# Patient Record
Sex: Female | Born: 1935 | Race: White | Hispanic: No | Smoking: Former smoker
Health system: Southern US, Community
[De-identification: ages and names within clinical notes are randomized; demographics above are authoritative.]

## PROBLEM LIST (undated history)

## (undated) DIAGNOSIS — Z794 Long term (current) use of insulin: Secondary | ICD-10-CM

## (undated) DIAGNOSIS — I495 Sick sinus syndrome: Secondary | ICD-10-CM

## (undated) DIAGNOSIS — I639 Cerebral infarction, unspecified: Secondary | ICD-10-CM

## (undated) DIAGNOSIS — I5042 Chronic combined systolic (congestive) and diastolic (congestive) heart failure: Secondary | ICD-10-CM

## (undated) DIAGNOSIS — I10 Essential (primary) hypertension: Secondary | ICD-10-CM

## (undated) DIAGNOSIS — Z950 Presence of cardiac pacemaker: Secondary | ICD-10-CM

## (undated) DIAGNOSIS — R55 Syncope and collapse: Secondary | ICD-10-CM

## (undated) DIAGNOSIS — IMO0001 Reserved for inherently not codable concepts without codable children: Secondary | ICD-10-CM

## (undated) DIAGNOSIS — I350 Nonrheumatic aortic (valve) stenosis: Secondary | ICD-10-CM

## (undated) DIAGNOSIS — Z862 Personal history of diseases of the blood and blood-forming organs and certain disorders involving the immune mechanism: Secondary | ICD-10-CM

## (undated) DIAGNOSIS — I2510 Atherosclerotic heart disease of native coronary artery without angina pectoris: Secondary | ICD-10-CM

## (undated) DIAGNOSIS — I359 Nonrheumatic aortic valve disorder, unspecified: Secondary | ICD-10-CM

## (undated) DIAGNOSIS — H5789 Other specified disorders of eye and adnexa: Secondary | ICD-10-CM

## (undated) DIAGNOSIS — I480 Paroxysmal atrial fibrillation: Secondary | ICD-10-CM

## (undated) DIAGNOSIS — N390 Urinary tract infection, site not specified: Secondary | ICD-10-CM

## (undated) DIAGNOSIS — N184 Chronic kidney disease, stage 4 (severe): Secondary | ICD-10-CM

## (undated) DIAGNOSIS — D649 Anemia, unspecified: Secondary | ICD-10-CM

## (undated) DIAGNOSIS — T829XXA Unspecified complication of cardiac and vascular prosthetic device, implant and graft, initial encounter: Secondary | ICD-10-CM

## (undated) DIAGNOSIS — I059 Rheumatic mitral valve disease, unspecified: Secondary | ICD-10-CM

## (undated) DIAGNOSIS — E119 Type 2 diabetes mellitus without complications: Secondary | ICD-10-CM

## (undated) HISTORY — PX: ABDOMINAL HYSTERECTOMY: SHX81

## (undated) HISTORY — PX: CHOLECYSTECTOMY: SHX55

## (undated) HISTORY — DX: Cerebral infarction, unspecified: I63.9

## (undated) HISTORY — PX: FRACTURE SURGERY: SHX138

## (undated) HISTORY — PX: EYE SURGERY: SHX253

---

## 1990-06-05 HISTORY — PX: FRACTURE SURGERY: SHX138

## 2001-06-05 HISTORY — PX: EYE SURGERY: SHX253

## 2003-06-06 HISTORY — PX: CARDIAC CATHETERIZATION: SHX172

## 2003-10-21 ENCOUNTER — Ambulatory Visit (HOSPITAL_COMMUNITY): Admission: RE | Admit: 2003-10-21 | Discharge: 2003-10-21 | Payer: Self-pay | Admitting: Cardiology

## 2005-02-14 ENCOUNTER — Encounter: Admission: RE | Admit: 2005-02-14 | Discharge: 2005-05-15 | Payer: Self-pay | Admitting: Nephrology

## 2005-09-30 ENCOUNTER — Encounter: Admission: RE | Admit: 2005-09-30 | Discharge: 2005-09-30 | Payer: Self-pay | Admitting: Geriatric Medicine

## 2006-04-05 DIAGNOSIS — Z950 Presence of cardiac pacemaker: Secondary | ICD-10-CM

## 2006-04-05 HISTORY — PX: PACEMAKER INSERTION: SHX728

## 2006-04-05 HISTORY — DX: Presence of cardiac pacemaker: Z95.0

## 2006-04-10 ENCOUNTER — Inpatient Hospital Stay (HOSPITAL_COMMUNITY): Admission: AD | Admit: 2006-04-10 | Discharge: 2006-04-13 | Payer: Self-pay | Admitting: Cardiology

## 2006-04-11 ENCOUNTER — Encounter (INDEPENDENT_AMBULATORY_CARE_PROVIDER_SITE_OTHER): Payer: Self-pay | Admitting: Cardiology

## 2006-06-10 ENCOUNTER — Inpatient Hospital Stay (HOSPITAL_COMMUNITY): Admission: EM | Admit: 2006-06-10 | Discharge: 2006-06-19 | Payer: Self-pay | Admitting: Emergency Medicine

## 2006-06-12 ENCOUNTER — Ambulatory Visit: Payer: Self-pay | Admitting: Infectious Diseases

## 2006-06-14 ENCOUNTER — Encounter: Payer: Self-pay | Admitting: Urology

## 2006-06-18 ENCOUNTER — Encounter (INDEPENDENT_AMBULATORY_CARE_PROVIDER_SITE_OTHER): Payer: Self-pay | Admitting: Specialist

## 2006-10-18 ENCOUNTER — Ambulatory Visit (HOSPITAL_COMMUNITY): Admission: RE | Admit: 2006-10-18 | Discharge: 2006-10-19 | Payer: Self-pay | Admitting: *Deleted

## 2007-01-02 ENCOUNTER — Ambulatory Visit (HOSPITAL_COMMUNITY): Admission: RE | Admit: 2007-01-02 | Discharge: 2007-01-02 | Payer: Self-pay | Admitting: Nephrology

## 2007-03-29 ENCOUNTER — Emergency Department (HOSPITAL_COMMUNITY): Admission: EM | Admit: 2007-03-29 | Discharge: 2007-03-29 | Payer: Self-pay | Admitting: *Deleted

## 2007-11-12 ENCOUNTER — Encounter: Admission: RE | Admit: 2007-11-12 | Discharge: 2007-11-12 | Payer: Self-pay | Admitting: Geriatric Medicine

## 2007-11-22 ENCOUNTER — Encounter: Admission: RE | Admit: 2007-11-22 | Discharge: 2007-11-22 | Payer: Self-pay | Admitting: Geriatric Medicine

## 2008-01-20 ENCOUNTER — Ambulatory Visit (HOSPITAL_COMMUNITY): Admission: RE | Admit: 2008-01-20 | Discharge: 2008-01-20 | Payer: Self-pay | Admitting: Surgery

## 2008-01-20 ENCOUNTER — Encounter: Admission: RE | Admit: 2008-01-20 | Discharge: 2008-01-20 | Payer: Self-pay | Admitting: Surgery

## 2008-01-20 ENCOUNTER — Encounter (INDEPENDENT_AMBULATORY_CARE_PROVIDER_SITE_OTHER): Payer: Self-pay | Admitting: Surgery

## 2008-06-18 ENCOUNTER — Encounter (INDEPENDENT_AMBULATORY_CARE_PROVIDER_SITE_OTHER): Payer: Self-pay | Admitting: Obstetrics and Gynecology

## 2008-06-18 ENCOUNTER — Inpatient Hospital Stay (HOSPITAL_COMMUNITY): Admission: RE | Admit: 2008-06-18 | Discharge: 2008-06-19 | Payer: Self-pay | Admitting: Obstetrics and Gynecology

## 2009-09-07 ENCOUNTER — Encounter (HOSPITAL_COMMUNITY)
Admission: RE | Admit: 2009-09-07 | Discharge: 2009-11-10 | Payer: Self-pay | Source: Home / Self Care | Admitting: Nephrology

## 2010-06-16 ENCOUNTER — Ambulatory Visit (HOSPITAL_COMMUNITY)
Admission: RE | Admit: 2010-06-16 | Discharge: 2010-06-16 | Payer: Self-pay | Source: Home / Self Care | Attending: Nephrology | Admitting: Nephrology

## 2010-09-19 LAB — CBC
HCT: 36.4 % (ref 36.0–46.0)
HCT: 40.4 % (ref 36.0–46.0)
Hemoglobin: 11.9 g/dL — ABNORMAL LOW (ref 12.0–15.0)
MCHC: 32.8 g/dL (ref 30.0–36.0)
MCHC: 32.8 g/dL (ref 30.0–36.0)
Platelets: 173 10*3/uL (ref 150–400)
Platelets: 201 10*3/uL (ref 150–400)
RBC: 4.47 MIL/uL (ref 3.87–5.11)
RDW: 14.6 % (ref 11.5–15.5)
RDW: 14.8 % (ref 11.5–15.5)
WBC: 8.4 10*3/uL (ref 4.0–10.5)

## 2010-09-19 LAB — COMPREHENSIVE METABOLIC PANEL
AST: 27 U/L (ref 0–37)
Albumin: 3.4 g/dL — ABNORMAL LOW (ref 3.5–5.2)
Albumin: 3.8 g/dL (ref 3.5–5.2)
BUN: 60 mg/dL — ABNORMAL HIGH (ref 6–23)
CO2: 27 mEq/L (ref 19–32)
Calcium: 9.4 mg/dL (ref 8.4–10.5)
Chloride: 96 mEq/L (ref 96–112)
Creatinine, Ser: 2.65 mg/dL — ABNORMAL HIGH (ref 0.4–1.2)
GFR calc non Af Amer: 17 mL/min — ABNORMAL LOW (ref >60)
Glucose, Bld: 143 mg/dL — ABNORMAL HIGH (ref 70–99)
Glucose, Bld: 148 mg/dL — ABNORMAL HIGH (ref 70–99)
Potassium: 3.7 mEq/L (ref 3.5–5.1)
Sodium: 135 mEq/L (ref 135–145)
Sodium: 138 mEq/L (ref 135–145)
Total Bilirubin: 0.5 mg/dL (ref 0.3–1.2)
Total Protein: 5.9 g/dL — ABNORMAL LOW (ref 6.0–8.3)

## 2010-09-19 LAB — TYPE AND SCREEN

## 2010-09-19 LAB — GLUCOSE, CAPILLARY
Glucose-Capillary: 124 mg/dL — ABNORMAL HIGH (ref 70–99)
Glucose-Capillary: 140 mg/dL — ABNORMAL HIGH (ref 70–99)

## 2010-09-19 LAB — ABO/RH: ABO/RH(D): O POS

## 2010-10-18 NOTE — Op Note (Signed)
[name], [name]               ACCOUNT NO.:  [id number]   MEDICAL RECORD NO.:  [id number]          PATIENT TYPE:  AMB   LOCATION:  SDS                          FACILITY:  [facility]   PHYSICIAN:  Thomas A. Cornett, M.D.DATE OF BIRTH:  15-Jun-1924   DATE OF PROCEDURE:  01/20/2008  DATE OF DISCHARGE:  01/20/2008                               OPERATIVE REPORT   PREOPERATIVE DIAGNOSIS:  Right breast mass x2.   POSTOPERATIVE DIAGNOSIS:  Right breast mass x2.   PROCEDURE:  Right breast needle-localized lumpectomy using 2 localizing  wires.   SURGEON:  Maisie Fus A. Cornett, MD   ASSISTANT:  OR staff.   ANESTHESIA:  General, LMA with 0.25% Sensorcaine local with epinephrine.   ESTIMATED BLOOD LOSS:  50 mL.   SPECIMEN:  Right breast tissue with both localizing wires, found to be  adequate specimen by radiograph.   DRAINS:  None.   INDICATIONS FOR PROCEDURE:  The patient is an 77 year old female who has  a right breast mass.  There was a second adjacent breast mass.  Biopsy  was discordant with the mammographic appearance, and excisional biopsy  was recommended to verify this was benign.  After discussion of this  with the patient, she understood and agreed to proceed.   DESCRIPTION OF PROCEDURE:  The patient was brought to the operating room  and placed supine.  After induction of LMA anesthesia, the right breast  was prepped and draped in sterile fashion.  She underwent needle  localization prior to coming to the operating room at the breast center  of [place], 2 wires being placed.  Sensorcaine 0.25% with epinephrine  was used for anesthesia.  Curvilinear incision was made in the right  lateral breast where the wires exited.  All tissue around both wires was  excised.  Radiograph was taken, which showed the wires to be intact,  clip be present within the specimen.  There was some bleeding from the  medial portion of the lumpectomy cavity and oversewed this with a 3-0  Vicryl.   Irrigation was used and suctioned out.  I used limited cautery  due to the fact she had a pacemaker.  Hemostasis was excellent.  After  irrigation, we closed the wound with  3-0 Vicryl.  Monocryl 4-0 was used to close the skin.  Dermabond was  applied as final dressing.  All final counts of sponge, needle, and  instruments were found to be correct at this portion of the case.  The  patient was awoke and taken to recovery in satisfactory condition.      Thomas A. Cornett, M.D.  Electronically Signed     TAC/MEDQ  D:  01/20/2008  T:  01/21/2008  Job:  [id number]   cc:   Hal T. Stoneking, M.D.

## 2010-10-18 NOTE — Op Note (Signed)
[name], [name]               ACCOUNT NO.:  [id number]   MEDICAL RECORD NO.:  [id number]          PATIENT TYPE:  OIB   LOCATION:  9372                          [facility]ACILITY:  [facility]   PHYSICIAN:  Charles A. Delcambre, MDDATE OF BIRTH:  25-Mar-1925   DATE OF PROCEDURE:  DATE OF DISCHARGE:                               OPERATIVE REPORT   PREOPERATIVE DIAGNOSES:  1. Uterine prolapse.  2. Cystocele.   POSTOPERATIVE DIAGNOSES:  1. Uterine prolapse.  2. Cystocele.   PROCEDURES:  1. Transvaginal hysterectomy.  2. Anterior repair.   SURGEON:  Charles A. Sydnee Cabal, MD   ASSISTANT:  Gerald Leitz, MD   ANESTHESIA:  General by laryngeal route.   FINDINGS:  Very large cystocele, no rectocele, moderate uterine  prolapse, normal-appearing ovaries bilaterally, but very high.  Therefore, not taken in the expediency of the operation to minimize her  care under anesthesia.  Estimated blood loss less than 50 mL, uterus to  pathology.  Instrument, sponge, and needle count correct x2.   DESCRIPTION OF PROCEDURE:  The patient was taken to the operating room,  placed in supine position.  General anesthetic was induced, then she was  placed in dorsal lithotomy position in universal stirrups.  Sterile prep  and drape was undertaken.  Weighted speculum was placed in the vagina.  The cervix was grasped with Lahey clamps, 1% epinephrine with lidocaine  was injected circumferentially and submucosal tissue around the cervix.  Knife was used to make the scoring incision around the cervix.  Bladder  pillars were cut with Metzenbaum scissors.  Finger dissection was used  to start the development of the bladder off the anterior lower uterine  segment.  Moist Ray-Tec was packed into the space further dissecting  this area.  The peritoneum was noted to slide on the anterior uterus and  this area was opened with Metzenbaum scissors and retractor was placed  in through the opening.  Deaver retractor, posterior  colporrhaphy was  then done and long speculum was placed in the posterior cul-de-sac.  Uterosacral ligaments were transected bilaterally, transfixion stitch  with 0 Vicryl.  Hemostasis was excellent.  Next, pedicle took the  uterine vessels bilaterally.  They were simple stitched and good  hemostasis resulted.  Next pedicle took the upper portion of the  cardinal ligaments and the broad ligament.  Fourth pedicle was free tied  and then transfixion stitch was 0 Vicryl.  Good hemostasis resulting and  with the utero-ovarian pedicles bilaterally.  Uterus was removed, go to  pathology.  Pedicles were visualized and noted to be of excellent  hemostasis and cuff was closed in 2 areas towards the midline to keep  the bladder up in the vault while the cystocele was being repaired.  Allis clamps were then placed in midline at the middle of the anterior  cuff and the submucosal dissection of the vagina off the  bladder/cystocele was undertaken.  This was done without difficulty.  Bladder flaps were developed with the knife and blunt dissection.  The  endopelvic fascia was plicated with 2-0 Vicryl, thus effectively  reducing the cystocele.  Excessive vaginal mucosa was removed, running  locking 2-0 Vicryl was then used to close the vaginal mucosa for the  anterior repair.  Richardson angle sutures were placed with 0 Vicryl and  the cuff was then running locked with 0 Vicryl with good hemostasis  resulting 2-inch gauze was moistened with KY gel and placed in the  vagina as a vaginal pack.  The patient was awakened and taken to  recovery with physician in attendance having tolerated the procedure  well.      Charles A. Sydnee Cabal, MD  Electronically Signed     CAD/MEDQ  D:  06/18/2008  T:  06/19/2008  Job:  [id number]

## 2010-10-18 NOTE — H&P (Signed)
NAMESTEPHANEY, [name] NO.:  [id number]   MEDICAL RECORD NO.:  [id number]         PATIENT TYPE:  WAMB   LOCATION:                                [facility]  [facility]   PHYSICIAN:  Charles A. Delcambre, MDDATE OF BIRTH:  1925/04/23   DATE OF ADMISSION:  06/18/2008  DATE OF DISCHARGE:                              HISTORY & PHYSICAL   HISTORY OF PRESENT ILLNESS:  A 77 year old gravida 3, para 3-0-0-3,  widowed, who initially referred from Dr. Pete Glatter for a mass at her  introitus.  We did on exam at that time note uterine prolapse as well as  a large cystocele.  No significant rectocele.  She has some urgency,  does not want  anything for this at this point.  Of note, she has gotten  worse pain after taking the pessary out and discomfort and pressure in  the pelvis and now presents wishing adamantly to go on with definitive  therapy, transvaginal hysterectomy, anterior repair, possible posterior  repair, and if possible technically bilateral oophorectomy.   PAST MEDICAL HISTORY:  Diabetes, cardiac arrhythmia reportedly atrial  fibrillation without clot, no antihypertension, no Coumadin used.  Per  her daughter who is a Publishing rights manager for Dr. Elsie Lincoln, her  cardiologist, and she also has hypertension.  She has mild renal  insufficiency.  Creatinine is less than 2.5.  Congestive heart failure  with a reaction to some type of medication, put on Lasix and no further  problems in this regard.  Mitral stenosis.   SURGICAL HISTORY:  Cholecystectomy, appendectomy, posterior repair in  1980 or 1981, rotator cuff left humerus fracture repair, left ankle  fracture repair x2.   MEDICATIONS:  1. Sular.  2. Lasix 40 once a day align.  3. Vicodin half to one tablet p.r.n.  4. Micardis 80 mg once a day.  5. Potassium, dose not specified.  6. Folic acid.  7. Iron.  8. Calcitriol.  9. Flecainide, Dr. Elsie Lincoln.  10.NovoLog 10 mg with meals.  This will be regular insulin and  Lantus      once a day insulin 26 units in the morning.  She does not know      hemoglobin A1c.   ALLERGIES:  DARVOCET, SULFA, PENICILLIN, CODEINE, all reactions not  specified.   SOCIAL HISTORY:  Denies tobacco, ethanol, or drug use.  Widowed, not  sexually active.   FAMILY HISTORY:  No major illness is contributing.   REVIEW OF SYSTEMS:  Denies fever, chills, rashes, lesions, headaches,  dizziness, chest pain, shortness of breath, wheezing, orthopnea,  diarrhea, constipation, bleeding per rectum, melena, hematochezia,  urgency, or frequency.  She does have some urgency, no frequency, or  dysuria or incontinence or hematuria.  No galactorrhea.  No emotional  changes.   PHYSICAL EXAMINATION:  GENERAL:  Alert and oriented x3.  VITAL SIGNS:  Blood pressure 122/80, respirations 24, and heart rate 80.  LUNGS:  Some rales are present in the base bilaterally left greater than  right.  HEART:  Regular rate and rhythm.  Grade 4 systolic murmur is noted and  listening at  the lower left sternal border.  At that point the daughter,  Vernona Rieger stated she had mitral stenosis.  ABDOMEN:  Overtly obese.  PELVIC:  Large cystocele, now extends outside of the vaginal vault and  with the Valsalva.  Uterus prolapses about 2 cm with Valsalva from the  introitus.  No rectocele was noted.  Uterus is not enlarged.  EXTREMITIES:  Some edema of the calf and symmetrical.   ASSESSMENT:  1. Large cystocele, uterine prolapse, symptomatic failing pessary      trial.  2. Hypertension.  3. Diabetes.  4. Cardiac arrhythmia, stable on meds.  5. Mitral stenosis.  6. Pacemaker in.   PLAN:  Transvaginal hysterectomy, bilateral oophorectomy if technically  feasible through the vaginal route, anterior repair, posterior repair as  indicated.  She accepts risks of infection, bleeding, bowel and bladder  damage, blood product risk including hepatitis and HIV exposure,  ureteral damage, postoperative vault prolapse,  continuous leakage  through low-pressure urethra, unmasked from cystocele repair, and  increased urinary urgency temporarily.  We will get a CBC, CMET, and  type and screen as she reportedly has numerous antibodies, Cleocin 900  mg IV preoperatively.  SCDs to the knees on.  A preop EKG from Dr.  Elsie Lincoln if recent or do a preop chest x-ray for evaluation of heart size  and for possible pleural effusions or other abnormality, incentive  spirometry, and fasting glucose upon arrival.  We will get clearing from  cardiologist, Dr. Elsie Lincoln.  The patient understands she is not an optimal  surgical candidate with multiple medical issues, but with degree pain  and inability to tolerate worsening symptoms, failing pessary.  We will  have a bed reserved in AICU for telemetry.       Charles A. Sydnee Cabal, MD  Electronically Signed     CAD/MEDQ  D:  06/10/2008  T:  06/11/2008  Job:  [id number]

## 2010-10-18 NOTE — Op Note (Signed)
[name], MELENA NO.:  [id number]   MEDICAL RECORD NO.:  [id number]          PATIENT TYPE:  OIB   LOCATION:  2807                         FACILITY:  [facility]   PHYSICIAN:  Darlin Priestly, MD  DATE OF BIRTH:  05/25/1925   DATE OF PROCEDURE:  10/18/2006  DATE OF DISCHARGE:                               OPERATIVE REPORT   PROCEDURES:  1. Pacemaker pocket modification.  2. Atrial lead adjustment.   COMPLICATIONS:  None.   INDICATIONS:  Ms. Roa is a 77 year old female patient of Dr. Lavonne Chick, Dr. Pete Glatter,  mother of Talon Witting, with history of syncope,  dizziness, paroxysmal atrial fibrillation, sick sinus syndrome,  underwent a dual chamber pacer implant 04/12/2006.  She did fall several  weeks after her implant with movement of her generator and retraction of  the passively fixed atrial lead, though she was still able to sense on  the lead. Her atrial sensing remained low with 0.8 mV.  She has had  recurrent UTIs and she has now been adequately treated for multiple  recurrent infections.  She is now brought for readjustment of her atrial  lead.   DESCRIPTION OF PROCEDURE:  After informed consent, the patient was  brought to the cardiac cath lab where left chest was prepped and draped  in sterile fashion.  ECG monitoring established.  1% lidocaine was used  to anesthetize over previous generator implant.  Next, approximately 2.5  to 3 cm incision was carried out over the previously placed generator  and hemostasis obtained with electrocautery.  Blunt dissection was used  to carry this down to the pacer capsule.  The capsule was then opened  with a scalpel and generator was freed from the scalpel.  The retaining  sutures were then cut and removed and the generator was then delivered  from the pocket.  It was noted that the atrial leads were entwined in  the pocket with moderate amount of scar tissue overlying both the atrial  and ventricular  leads.  The leads were then freed from the pocket floor.  The atrial lead retaining sutures were then removed and the atrial lead  was then removed from the header.  P-waves measured proximal 1 mV.  We  then advanced a stylet into the atrial lead and attempted to advance  lead further into the atrium.  However, the tip was already adherent to  the atrial wall.  This was a passive fixation lead.  We did try torquing  the lead to see if we could easily free this up from the atrium.  However, it remained attached and we ultimately abandoned this approach  as she was sensing in the atrium over 98% of the time and we did  increase her atrial sensing from 1-3 mV.  After the lead had adequate  slack we did resuture the lead down to the pectoral fascia.  We did  suture the lead down to the pectoral fascia with two 2-0 silk sutures.  A single silk suture was then placed in the superior aspect of the  pocket floor.  The  box then copiously irrigated with 1% Kanamycin  solution.  The atrial lead was then connected back to the generator and  a head screw was tightened.  Again pacing was confirmed the generator  and leads were then delivered to the pocket and the header was then  secured to the silk suture.  Subcutaneous layer was then closed in  running 2-0 Vicryl.  The skin was then closed in 4-0 Vicryl.  Steri-  Strips were applied.  The patient then transferred to recovery room in  stable condition.   CONCLUSION:  1. Successful pocket revision with advancement of the atrial lead.  We      were able to improve atrial sensing from 1 mV to 3 mV.  Threshold      remained high at approximately 4 to 4.5 volts at 0.4 milliseconds.      Darlin Priestly, MD  Electronically Signed     RHM/MEDQ  D:  10/18/2006  T:  10/18/2006  Job:  [id number]   cc:   Hal T. Stoneking, M.D.  Madaline Savage, M.D.

## 2010-10-18 NOTE — H&P (Signed)
[name], [name] NO.:  [id number]   MEDICAL RECORD NO.:  [id number]          PATIENT TYPE:  OIB   LOCATION:  2854                         FACILITY:  [facility]   PHYSICIAN:  Darlin Priestly, MD  DATE OF BIRTH:  [birth year]   DATE OF ADMISSION:  10/18/2006  DATE OF DISCHARGE:                              HISTORY & PHYSICAL   REASON FOR ADMISSION:  Atrial lead adjustment secondary to movement of  pacer wire.   HISTORY OF PRESENT ILLNESS:  A 77 year old white widowed female with  history of some complete heart block episodically with associated  dizziness and placement of a permanent Medtronic pacemaker DDD in  November 2007.  The patient has been atrially sensing and ventricularly  capturing since that time.  She was hospitalized in January 2008 for  urinary tract infection.  At that time she had fallen across the bed and  we believe the lead was dislodged when she was moving to get up out of  the bed.  It was shortly thereafter that on interrogation we found that  the atrial lead was not pacing and chest x-ray confirmed that the wire  itself had been pulled back.   PAST MEDICAL HISTORY:  1. Cardiac as stated.  She does have a history of two-vessel coronary      artery disease with last heart catheterization Oct 21, 2003, 60%      stenosis of diagonal #1, 30% stenosis PDA, and 60-70% proximal to      mid PLA.  Persantine Cardiolite was done in April 2007.  EF was      62%, no ischemia.  Last 2-D echocardiogram was September 06, 2005:      Moderate to marked MAC, EF was normal, mild to moderate mitral      stenosis, mild MR, mild TR, mild AV stenosis.  AI was new.  2. She also has a history of kidney stones with lithotripsies in the      1990s.  3. She has had chronic UTIs on antibiotics.  Since December 2007 she      has had increasing pain in urination.  Prolonged hospitalization in      January 2008 secondary to UTI and then urinary residual, as well as  pain.  Followed by Dr. Sherron Monday, currently on Keflex to prevent      infections.  4. GYN:  Has a pessary.  5. Renal insufficiency; creatinine runs 2.2 to 2.4.  6. Insulin-dependent diabetes mellitus 2, controlled.  7. Hypertension.  8. Hyperlipidemia.  9. History of autoimmune hemolytic anemia, treated for 6 months with      steroids.  They felt it was secondary to possible Norvasc, but      definitely quinine.  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  She is monitored by Dr.      Darrick Penna for anemia with her renal insufficiency.  11.Status post cholecystectomy.  12.History of ankle fracture x2 with surgeries.  13.History of fracture to the left humerus with bone graft.  14.History of right rotator cuff repair.  15.History of COPD but it is aggravated by  beta blockers.   ALLERGIES:  SULFA, PENICILLIN, DARVOCET.  Intolerant to NORVASC, QUININE  and CODEINE.  Intolerance to BETA BLOCKERS, FLOXACILLIN, LEVAQUIN due to  severe nightmares, and ACE INHIBITORS have caused cough.   FAMILY HISTORY:  Not significant to this admission.   SOCIAL HISTORY:  Widowed with three children, six grandchildren, one  great-grandchild.  Does not drink alcohol, stopped tobacco 20 years ago.  She is a retired Engineer, civil (consulting).  No longer drinks caffeine secondary to her  bladder issues.   OUTPATIENT MEDICATIONS:  1. Flecainide 50 b.i.d.  2. Vytorin 10/40 daily.  3. Folic acid 1 mg twice a day.  4. Colace 200 mg twice a day.  5. Multivitamin daily.  6. Cozaar 100 mg daily.  7. Lantus insulin 40 units in the morning.  8. NovoLog insulin 10 units at breakfast, 12 units at lunch, and 12      units at supper, though this is variable depending on her glucose.  9. Calcitriol 0.5 mg daily.  10.Nu-Iron 150 mg twice a day.  11.Aspirin 81 mg daily.  12.Keflex 500 mg daily.  13.Lasix 20 mg every-other day.  14.K-Dur 10 mEq every-other day.   REVIEW OF SYSTEMS:  GENERAL:  Loss of appetite with declining weights,  5  pounds in the last month.  SKIN:  Without rashes.  HEENT:  She does wear  glasses.  No blurred vision.  CARDIOVASCULAR:  No chest pain.  PULMONARY:  Some dyspnea on exertion but minimal at this point.  GI:  Frequent constipation on Colace as well as fiber frequently and Activa  has helped.  GU:  Frequent urination every 30 minutes with urethral  pain.  NEUROLOGIC:  No lightheadedness, dizziness or syncope.  MUSCULOSKELETAL:  She does get hip pain and back pain.  ENDOCRINE:  She  is diabetic.  No thyroid disease.   PHYSICAL EXAMINATION:  VITAL SIGNS TODAY:  Blood pressure 128/66, pulse  84 and respirations 18, temperature 97.1, oxygen saturation 98%.  Height  5 feet, weight 75 kg.  Accu-Chek this morning 103.  GENERAL:  Alert and oriented x3.  Having toe cramps currently.  Skin  warm and dry.  Brisk capillary refill.  Pleasant affect.  HEENT:  Sclerae are clear, glasses in place.  NECK:  Supple.  No JVD, no adenopathy.  HEART:  S1, S2.  Regular rate and rhythm with a 2-3/6 systolic murmur.  ABDOMEN:  Soft, nontender, positive bowel sounds.  Cannot palpate liver,  spleen or masses.  LOWER EXTREMITIES:  Without edema.  Pedal pulses are present.  Please  note she has bruising on the left lower extremity and left upper arm  from fall on Saturday at her nephew's home.  NEUROLOGIC:  Alert and oriented x3.  Follows commands.   LABORATORIES:  Pending.   IMPRESSION:  1. Dislodgement of her atrial lead.  Plan is for better position of      the atrial lead today.  2. Complete heart block with a permanent transvenous pacemaker DDD      Medtronic.  3. History of coronary disease.  4. Diabetes mellitus.  5. Severe urinary discomfort with frequent urination, followed by Dr.      Sherron Monday.  6. Renal insufficiency with creatinine 2.2 to 2.4.  7. Some mild heart failure in February 2008 with elevated BNP, now      resolved. 8. Hypertension, controlled.  9. Diabetes mellitus type 2,  controlled.  10.History of anemia followed by Dr. Darrick Penna with history of Aranesp  injections, but none recently.   PLAN:  Plan for temporary pacemaker and adjustment of her atrial lead.      Darcella Gasman. Valarie Merino      Darlin Priestly, MD  Electronically Signed    LRI/MEDQ  D:  10/18/2006  T:  10/18/2006  Job:  [id number]   cc:   Martina Sinner, MD  Hal T. Stoneking, M.D.  Madaline Savage, M.D.  James L. Deterding, M.D.

## 2010-10-18 NOTE — Discharge Summary (Signed)
[name], [name] NO.:  [id number]   MEDICAL RECORD NO.:  [id number]          PATIENT TYPE:  OIB   LOCATION:  2008                         FACILITY:  [facility]   PHYSICIAN:  Darlin Priestly, MD  DATE OF BIRTH:  1925/02/09   DATE OF ADMISSION:  10/18/2006  DATE OF DISCHARGE:  10/19/2006                               DISCHARGE SUMMARY   DISCHARGE DIAGNOSES:  1. Status post pacer pocket modification and adjustment of the      arterial lead.  2. Status post placement of Medtronic pacemaker in November of 2007      ________ mode.  3. Known history of intermittent complete heart block associated with      dizziness.  4. History of 2-vessel coronary artery disease.  5. Aortic stenosis.  6. Nephrolithiasis.  7. Chronic urinary tract infection on continuous antibiotic therapy.  8. Renal insufficiency.  9. Hypertension.  10.Hyperlipidemia.  11.Insulin-dependent diabetes mellitus - controlled.  12.Autoimmune hemolytic anemia, status post steroid therapy.      Questionable secondary to Norvasc therapy and definitely secondary      to quinine.  13.History of ________.  14.Status post cholecystectomy.  15.Status post multiple ankle surgeries due to fracture and history of      fracture of the left humerus with bone graft.  16.Status post rotator cuff repair on the right.  17.Chronic obstructive pulmonary disease that is aggravated by beta-      blockers.   This is a 77 year old Caucasian female who underwent permanent pacemaker  placement in November of 2007.  During her hospitalization in January of  2008, for a urinary tract infection, patient fell and dislodged the  pacer wire.  After interrogation of the pacer, it was found that the  atrial lead was not pacing and chest x-ray confirmed that the wire,  itself, has been put back.  So, on Oct 18, 2006, she was admitted to the  hospital for a pacer pocket modification and atrial lead adjustment.  This procedure  was done by Dr. Jenne Campus and patient tolerated this well.  Later, the same day was seen by Dr. Elsie Lincoln.  The next morning, the  Medtronic representative interrogated the pacer, all parameters were  within normal limits.  Dr. Domingo Sep saw patient in the morning on May 16  and considered her stable for discharge home.   DISCHARGE MEDICATIONS:  1. Folic acid 1 mg daily.  2. Nu-Iron 150 mg b.i.d.  3. Calcitriol 0.5 mg daily.  4. NovoLog 10 units every morning, 12 units at lunch and in the      evening.  5. Lantus 40 units every more.  6. Keflex 500 mg daily.  7. Flecainide 50 mg b.i.d.  8. Potassium chloride 10 mEq every other day.  9. Lasix 20 mg every other day.  10.Cozaar 100 mg daily.  11.Vytorin 10/40 mg daily.  12.Aspirin 81 mg daily.   Patient was advised to stay on a low-fat, low-cholesterol diet.  She was  instructed not to perform overhead reaching, avoid vigorous movements  with right arm.  Do not wet incision site for  a week.   DISCHARGE FOLLOWUP:  Dr. Jenne Campus will see patient on November 29, 2006 at 2  p.m. and in a week after the procedure, the incision will be checked to  make sure that it is healed appropriately.      Raymon Mutton, P.A.      Darlin Priestly, MD  Electronically Signed    MK/MEDQ  D:  10/19/2006  T:  10/20/2006  Job:  [id number]

## 2010-10-21 NOTE — Discharge Summary (Signed)
NAMEZHOE, CATANIA NO.:  [id number]   MEDICAL RECORD NO.:  [id number]          PATIENT TYPE:  INP   LOCATION:  6522                         FACILITY:  [facility]   PHYSICIAN:  Madaline Savage, M.D.DATE OF BIRTH:  10/28/1924   DATE OF ADMISSION:  04/10/2006  DATE OF DISCHARGE:  04/13/2006                                 DISCHARGE SUMMARY   DISCHARGE DIAGNOSES:  1. Symptomatic bradycardia with second-degree, 2 to 1, AV block status      post permanent transvenous pacemaker placement by Dr. Jenne Campus.  2. Nonobstructive coronary artery disease with a negative stress test in      April 2007.  3. Renal insufficiency.  4. Known diabetes mellitus, type 2.  5. Hypertension, controlled.  6. History of paroxysmal atrial fibrillation, maintained sinus rhythm on      flecainide.  7. Frequent urinary tract infections.  8. Hyperlipidemia.   Dictation ended at this point.      Raymon Mutton, P.A.    ______________________________  Madaline Savage, M.D.    MK/MEDQ  D:  04/13/2006  T:  04/14/2006  Job:  [id number]

## 2010-10-21 NOTE — Op Note (Signed)
[name], [name]               ACCOUNT NO.:  [id number]   MEDICAL RECORD NO.:  [id number]          PATIENT TYPE:  AMB   LOCATION:  DAY                          FACILITY:  [facility]   PHYSICIAN:  Martina Sinner, MD DATE OF BIRTH:  Jul 01, 1924   DATE OF PROCEDURE:  06/14/2006  DATE OF DISCHARGE:                               OPERATIVE REPORT   PREOPERATIVE DIAGNOSIS:  Cystitis, possible bladder enteric fistula.   POSTOPERATIVE DIAGNOSIS:  Cystitis.   NAME OF SURGERY:  Cystoscopy, cystogram, bladder installation therapy.   Romelle Muldoon has symptomatic cystitis with severe frequency and that  has required admission to the hospital and she has not responded well to  ceftriaxone which is an appropriate antibiotic for her E-coli.  For this  reason I want to examine her under anesthesia.   The patient was prepped and draped in the usual fashion.  Initially  anesthesia had some trouble with her blood pressure.  She does have  cardiac comorbidities.  She was given, I believe, a neo epinephrine drip  and her blood pressure responded and I was asked to go ahead with  cystoscopy.   When I initially cystoscoped the patient, she had diffuse hyperemia in  keeping with a diagnosis of bacterial cystitis.  She had diffuse  cystitis cystica.  The trigone was normal.  There is no stitch, foreign  body or carcinoma.  Her bladder was spastic in that I could not easily  do a gravity cystogram.   Her urethra was erythematous and normal length.   I did a pelvic examination and her diaphragm pessary was in the high  vaginal vault in good position.  She has an obvious cystocele.  Tissues  were well estrogenized.  There is no inflammation or infection inside  the vagina.   I tried not to over distend her bladder since it caused some bleeding.  I did a lot of irrigation to make certain that the bladder was clear.  When I cystoscoped her at the end of the case she had erythema but no  obvious  bleeding.   I initially instilled a 15-French Foley catheter.  I attempted to  perform a gravity cystogram under fluoroscopy but no dye would enter the  bladder.   For this reason I took out the Foley catheter and scoped the patient  again.  I rinsed her bladder more.  There is no question she had at  least 50 mL bladder capacity with the cystoscope and I think much  greater than this.  I thought it was very safe therefore to inject 50 mL  of contrast through the cystoscope sheath under fluoroscopic guidance.   I did inject 50 to 60 mL of contrast.  She had a nice smooth bladder  with mild trabeculation.  There was no fistula.   The bladder was emptied and rinsed.  There was no residual dye in the  vagina or bowel.   I finally irrigated the bladder until it was clear.  I then instilled 15  mL of 0.5% Marcaine in combination with 400 mg of Pyridium.  A Foley  catheter was not inserted since she does not tolerate it well.   I am going to continue was symptomatic and supportive care.  She has had  two high residual urine volumes and I am going to see if she can  tolerate in and out cath twice a day.  I will follow her.           ______________________________  Martina Sinner, MD  Electronically Signed     SAM/MEDQ  D:  06/14/2006  T:  06/15/2006  Job:  [id number]

## 2010-10-21 NOTE — Discharge Summary (Signed)
NAMEMOLLY, [name] NO.:  [id number]   MEDICAL RECORD NO.:  [id number]          PATIENT TYPE:  INP   LOCATION:  6522                         FACILITY:  [facility]   PHYSICIAN:  Madaline Savage, M.D.DATE OF BIRTH:  11-30-1924   DATE OF ADMISSION:  DATE OF DISCHARGE:  04/13/2006                                 DISCHARGE SUMMARY   ADDENDUM:  This is a continuation to the previously started dictation.  Dictation [id number].   HPI/HOSPITAL COURSE:  Ms Parker is a 77 year old Caucasian lady, patient of  Dr. Elsie Lincoln, with a previous history of paroxysmal atrial fibrillation in  2000, since 2005 and Toprol intolerance secondary to CAPD and increased  dyspnea.  Was taking flecainide at home and maintained sinus rhythm.  She  has a known history of noncritical coronary artery disease by last  catheterization in May 2005.  The most recent Myoview stress test in April  2007, revealed EF of 67% and no ischemia.  The patient was in her usual  state of health up until a week ago when she started noticing recurrent  episodes of dizziness during which she would stop her activity and had to  hold onto some object if she was standing or walking.  That continued for  two to three minutes and later episodes started happening even while patient  was sitting.  Because of her prior history of bradycardia in the past,  flecainide dose was reduced to 50 mg b.i.d. and event monitor was ordered to  rule out any recurrent PAF or bradyarrhythmia.   On the day of admission, April 10, 2006, the event monitor revealed a 4  second pulse with a second-degree 2:1 heart block.  The patient also had an  episode of dizziness that occurred right at the time of the asystole.   Dr. Elsie Lincoln saw the patient in the office and recommended admission to the  hospital for permanent pacemaker placement and monitoring on telemetry.   During the hospitalization, the patient underwent an echocardiogram, which  was  done on April 11, 2006, and it showed DUST, below normal limit of  LVEF, approximately 50%, and moderate diastolic dysfunction.  Mild aortic  valve stenosis was also observed with estimated aortic valve area 0.99  square centimeters by __________ and by VTI it was 0.94 square centimeters.  Mean transaortic valve gradient was 11 mmHg.  There was marked annular  calcification and mildly reduced mitral valve leaflet excursion.   FINDINGS:  Consistent with very mild mitral stenosis.  Mean transmitral  gradient was 3 mmHg and mitral valve by pressure, half time was 1.86 square  centimeters.  Left atrial size was the upper limits of normal.   On April 12, 2006, the patient underwent permanent pacemaker implantation  by Dr. Jenne Campus and she received a Medtronic device.  The procedure was  carried out with no complications.  The next morning the [employer]  representative interrogated the device and all values were within normal  limits.  No changes made.  Next morning, she had a chest x-ray that did not  reveal any acute disease.  No  pneumothorax.  Dr. Elsie Lincoln and Dr. Jenne Campus,  both saw the patient and considered her to be stable for discharge home.   LABORATORIES:  Hemoglobin was 13.9, hematocrit 40, white blood cell count  5.9.  Platelets 210.  Sodium 140, potassium 4.2, chloride 106, CO2 27, BUN  46, creatinine 2.3, which was an improvement from 2.6 the previous day.  Glucose 110.  Hemoglobin A1c 5.8.   DISCHARGE INSTRUCTIONS:  The patient was instructed to avoid any vigorous  movements with the right arm and especially avoid overhead reaching and no  lifting with the right arm.  She also was instructed to keep the incision  dry and intact for seven days.   I had to order Kalia Vahey, who is the nurse practitioner in our office and  she will check the pacer site in a week at home and then patient will be  seen by Dr. Elsie Lincoln in the office on May 01, 2006 at 4:00 p.m., then a   follow up by Dr. Jenne Campus in three months, and that appointment will be  scheduled later.   DISCHARGE MEDICATIONS:  1. Flecainide 50 mg b.i.d.  2. Vytorin 10/80 mg daily.  3. Folic acid 1 mg b.i.d.  4. Potassium chloride 10 mEq b.i.d.  5. Colace 100 mg b.i.d.  6. Multivitamins daily.  7. Hyzaar 100/25 mg daily.  8. Cozaar 100 mg b.i.d.  9. Amitriptyline 25 mg q.h.s.  10.Trimethoprim 100 mg daily.  11.Lantus 50 units q.a.m.  12.NovoLog 10 units at breakfast, 12 units at lunch, 12 units at supper.  13.Caltrate with vitamin D 60 mg b.i.d.  14.Iron complex, Nu-Iron 150 mg b.i.d.  15.Temovate ophthalmic ointment apply b.i.d.  16.Aspirin 81 mg daily.      Raymon Mutton, P.A.    ______________________________  Madaline Savage, M.D.    MK/MEDQ  D:  04/13/2006  T:  04/14/2006  Job:  [id number]

## 2010-10-21 NOTE — Cardiovascular Report (Signed)
NAMESINCERE, BERLANGA NO.:  [id number]   MEDICAL RECORD NO.:  [id number]                   PATIENT TYPE:  OIB   LOCATION:  2852                                 FACILITY:  [facility]   PHYSICIAN:  Madaline Savage, M.D.             DATE OF BIRTH:  06-17-1924   DATE OF PROCEDURE:  10/21/2003  DATE OF DISCHARGE:  10/21/2003                              CARDIAC CATHETERIZATION   PROCEDURES PERFORMED:  1. Selective coronary angiography by Judkins technique.  2. Retrograde left heart catheterization without left ventricular     angiography.   ENTRY SITE:  Right femoral.   DYE USED:  Visipaque.   COMPLICATIONS:  None.   PATIENT PROFILE:  The patient is a 77 year old widowed white female from  [place], [place] who has known longstanding diabetes and also has  renal insufficiency.  The patient is obese and has experienced progressive  shortness of breath complicated by the fact that exercise is difficult  related to her orthopedic problems including hips and knees.  The patient  had a recent echocardiogram and Cardiolite stress test at the [facility] office in [place] showing good left ventricular  systolic function and suspicious mild inferior wall ischemia considered to  be low risk.  There was also some inferior hypokinesis.  Because of these  findings and her risk factors, the patient was scheduled for outpatient  cardiac catheterization to be planned as a dye limited study because of  creatinine of 2.1.  Previous creatinine is 1.9.  Among the patient's various  medical problems include hypertension, diabetes mellitus, hyperlipidemia,  chronic renal insufficiency, obesity, previous multiple orthopedic  surgeries, autoimmune hemolytic anemia now resolved and sedentary lifestyle.  Today's procedure was performed on an inpatient basis electively without  complications.  The dye load of Visipaque was 100 mL.   RESULTS:   PRESSURES:  Left ventricular pressure was 180/85, mean of 125.  Left  ventricular pressure was 177/8, end-diastolic pressure 13 without any  significant aortic valve gradient by pullback technique.   ANGIOGRAPHIC RESULTS:  The patient's cinefluoroscopy of the heart showed  anular calcification in the mitral valve as well as right coronary  calcification proximal and mid and also proximal LAD calcification as well.   1. The left main coronary artery was approximately 4.5 mm in diameter and     was smooth without any significant obstructive lesions.  2. The LAD coursed the cardiac apex and gave rise to a bifurcating diagonal     branch.  The LAD was small to medium in size with luminal irregularities     throughout, but no significant lesions.  A fairly large bifurcating     diagonal branch showed that after a bifurcation the medial limb closest     to the LAD of that diagonal contained a 60% area of irregular contour and     lumpy, bumpy irregularity.  The  distal vessel looked more normal in     appearance though tortuous.  The second limb of the diagonal appeared to     be large and without any obstructive lesion.  3. The intermediate ramus branch was a very large vessel coursing near the     cardiac apex showing no obstructive lesions.  The circumflex contained     lumpy, bumpy irregularity in the mid portion of the vessel just after the     first obtuse marginal branch arises, but no significant lesions.  4. The right coronary artery was calcified as was previously mentioned     proximally and in the mid portion of the vessel.  No obstructive lesions     were noted from the ostium of the RCA to the distal bifurcation point of     that vessel. The posterior descending branch was a medium to even large     size vessel with 30% areas of luminal irregularity proximally.  The     posterior lateral branch was a  long fairly small vessel 1.5 to 1.75 mm     in diameter and there was between a  60-70% stenosis in that vessel in its     proximal to mid portion.  The distal vessel was small and long.   The left ventricle was entered and pressures measured, but no left  ventricular angiogram was performed.   Right femoral arteriography with hand injection showed suitable anatomy for  Angio-Seal placement which was placed without any complications with an  excellent result.   FINAL DIAGNOSES:  1. Mild two-vessel coronary artery disease.     A. 60% medial bifurcating branch of diagonal branch #1.     B. 30% posterior descending branch stenosis of the right coronary artery.     C. 60-70% proximal to mid posterior lateral artery stenosis of the distal        right coronary artery.  2. No evidence of aortic valve gradient by pullback technique.  3. Successful Angio-Seal arteriotomy of the right femoral artery.   PLAN:  Medical therapy of mild coronary disease.  The patient's shortness of  breath would most likely be explained by body habitus, lack of exercise and  lack of activity and possibly related to her previous history of tobacco  use.                                               Madaline Savage, M.D.    WHG/MEDQ  D:  10/21/2003  T:  10/22/2003  Job:  [id number]   cc:   Oneida Alar  Fax #:  [phone]   Debra Stengel  [place], [place]

## 2010-10-21 NOTE — Discharge Summary (Signed)
NAMEJESSELLE, Parker NO.:  [id number]   MEDICAL RECORD NO.:  [id number]          PATIENT TYPE:  INP   LOCATION:  4715                         FACILITY:  [facility]   PHYSICIAN:  Madaline Savage, M.D.DATE OF BIRTH:  1924-11-07   DATE OF ADMISSION:  06/10/2006  DATE OF DISCHARGE:  06/19/2006                               DISCHARGE SUMMARY   [name] is a 77 year old white, widowed, female patient of Dr.  Elsie Lincoln, who is Shaterra Sanzone mother, who came in to the emergency room  because of severe pain from UTI.  She was actually admitted for  intractable pain and further treatment.  She failed antibiotics as an  outpatient.  She had been placed on Cipro initially, but then changed to  Macrobid after the culture came back.  She tried that for 1 week, then  resumed trimethoprim; however, she then had increased burning with  urination and was then placed on Keflex.  She continued to have burning  and Pyridium was added.  Over the last 2 days, prior to admission, she  had increased pain, increased frequency, she was basically up all night.  She was unable to eat and she started vomiting, thus she came to the  emergency room.  She was admitted by our service.  A GU consult was  called.  She was seen by Dr. Lorin Picket MacDiarmid who put her on IV  ceftriaxone.  He recommended a nephrology consult because he would like  to use Macrodantin.  She has a history of chronic renal insufficiency  and is followed usually by Dr. Darrick Penna as an outpatient.  Her symptoms  did not seem to improve.  The renal consult was called.  They did not  want Macrodantin used.  They also felt an infectious disease consult was  needed.  She was seen by Dr. Roxan Hockey who also recommended the use of  topical estrogens in chronic UTIs.  This had been tried as an outpatient  and she was not able to tolerate estrogen cream; however, it was  attempted again and she again had a burning sensation.  They  also tried  B&O suppositories; however, this caused the urethral burning to be  worse.  Levsin was tried for symptomatic relief, which helped her  anywhere from 20 minutes to an hour.  She continued to have burning and  had to sit up on the bedside commode all night.  She, at one point,  became dizzy and lightheaded from the narcotics and being over tired and  fell across her bed at one point in her hospitalization.  She went on to  have a cystoscopy by Dr. Sherron Monday on June 14, 2006, which showed  positive cystitis, diffuse hyperemia, no cancer, no fistula was noted.  He did instill some Pyridium and Marcaine for rescue treatments.  He  irrigated and flushed her bladder.  The day after this, she did feel  better; however, then her symptoms again got worse.  They started  scanning her bladder for residual.  She had high postvoid residuals.  It  was then decided that she  should have in and out cath.  Her residuals  with the in and out cath were high and it was thought she would have to  be discharged home on in and out catheters 3 times a day.  The patient  would not be able to do those on her own.  Her daughter agreed to  perform these.  GU had recommended that she be on Keflex for a total of  7-10 days p.o. upon discharge.   On June 19, 2006, she was seen again by nephrology who scheduled her  to have an outpatient appointment.  She will need to continue her  appropriate injections as an outpatient.  Also to note, her HCTZ had  been held when she was in the hospital.   LABS ON ADMISSION:  Hemoglobin was 12.4, hematocrit was 37.7, WBCs 4.9,  platelets 248.  On June 19, 2006, hemoglobin was 9.6, hematocrit was  28.8, WBCs were 8.2 and platelets were clumping.  Sodium is 135,  potassium 5.0, BUN was 73, creatinine up to 3.0, on admission it was  3.3.  Albumin was 3.8, magnesium was 2.6, AST was 31, ALT was 20, total  protein was 5.8.  BNP was 127, on January 13 it was 1023 and  on January  14 it was 822.  I should note, she did receive IV Lasix 40 mg on June 19, 2006.  Iron was 24.  TIBC was 158, percent saturation was 15.  Chest  x-ray showed no acute distress on June 10, 2006.  Renal ultrasound  showed an increased echogenicity of kidneys.  No hydronephrosis,  bilateral renal cysts.   DISCHARGE MEDICATIONS:  Are:  1. Flecainide 50 mg twice a day.  2. Vytorin 10/80 twice a day.  3. Folic acid 1 mg once a day.  4. New iron 150 mg twice a day.  5. Aspirin 81 mg once a day.  6. Lantus 30 units once a day.  7. Mucinex 600 mg twice a day x7 days.  8. Aranesp 200 mcg subcu every Monday.  9. Keflex 500 mg 4 times a day x5 days then once a day.  10.Xopenex MDI 2 puffs twice a day.  11.Colace 100 mg twice a day.  12.Multivitamin once a day.   DISCHARGE DIAGNOSES:  1. Severe urinary tract infection with intractable pain.  2. Chronic urinary tract infections.  3. Chronic renal insufficiency with some elevation during      hospitalization.  4. History of coronary artery disease with 2-vessel coronary disease,      60% in a diagonal, 30% in PDA, 50-70% proximal PLA.  Negative      Persantine Cardiolite September 07, 2005.  Ejection fraction of 62%.  5. PT VDP Medtronic on November of 2007 for asystole, I believe      symptomatic and near syncope.  6. Insulin dependent diabetes mellitus.  7. Hypertension.  8. Hyperlipidemia.  9. History of autoimmune hemolytic anemia.  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  11.Diabetic retinopathy and neuropathy.  12.Autoimmune skin disorder.  13.Status post cholecystectomy.  14.Chronic obstructive pulmonary disease.      [name], N.P.    ______________________________  Madaline Savage, M.D.    BB/MEDQ  D:  08/08/2006  T:  08/09/2006  Job:  [id number]   cc:   Hal T. Stoneking, M.D.  Martina Sinner, MD  Llana Aliment. Deterding, M.D.

## 2010-10-21 NOTE — Consult Note (Signed)
[name], [name]               ACCOUNT NO.:  [id number]   MEDICAL RECORD NO.:  [id number]          PATIENT TYPE:  INP   LOCATION:  4715                         FACILITY:  [facility]   PHYSICIAN:  Alvin C. Lowell Guitar, M.D.  DATE OF BIRTH:  1925-01-06   DATE OF CONSULTATION:  DATE OF DISCHARGE:                                 CONSULTATION   I was asked by Dr. Alfredo Martinez to see this 77 year old female who  has a known history of chronic kidney disease, stage IV, followed at our  office by Dr. Darrick Penna, admitted on January6, 2008, with symptomatic  urinary tract infection.  We are asked to see her, because of concerns  of withholding Macrodantin therapy.  The patient has not been treated  with Macrodantin because of a reduced GFR.  Baseline serum creatinine is  2.5 mg/dL as of [id number].  Current labs reveal a BUN of 57,  creatinine 3.39.  The patient had been taking Cozaar 50 mg per day and  Hyzaar 100/25 mg per day.  Both of those medications are on hold after  admission.  She reports a history of poor appetite times several months.  Urine culture during this hospitalization has grown a highly resistant E-  coli as well as a usual enterococcus.  The patient complains of  frequency and severe urgency.  Bladder scan done by Dr. Sherron Monday on  January3, 2008, revealed a 125 mL postvoid residual.  Cystoscopy by him  in March, revealed essentially no significant findings.   CURRENT MEDICATIONS:  1. Amitriptyline.  2. Aspirin.  3. Rocephin.  4. Flexor.  5. Colace.  6. Folic acid.  7. Insulin.  8. Hydrochlorothiazide 25 mg per day.  9. Potassium 10 mEq per day.  10.Pyridium.   PAST HISTORY:  Remarkable for:  1. Diabetes.  2. Anemia.  3. Hypertension.  4. Dyslipidemia.  5. Status post pacemaker placement.  6. History of atherosclerotic cardiovascular disease.  7. Coronary artery disease.  8. History of a urinary stone disease, status post lithotripsy years      ago.   SOCIAL HISTORY:  She is a retired Engineer, civil (consulting).  She lives with her daughter.   PHYSICAL EXAMINATION:  VITAL SIGNS:  Blood pressure is 125/60, afebrile.  GENERAL:  She is pleasant, moderately obese, Caucasian female.  HEENT:  Atraumatic.  Extraocular movements are intact.  LUNGS:  Clear.  HEART:  Regular rhythm rate.  ABDOMEN:  Soft.  EXTREMITIES:  With trace bilateral edema.  NEUROLOGIC:  No obvious focality.   RENAL ASSESSMENT:  1. Chronic kidney disease, stage IV, secondary to diabetes mellitus      with exacerbation.  2. Recurrent urinary tract infection with resistant E.  Coli as well      as and Enterococcus, symptomatic.  3. Increased postvoid residual, possibly due to neurogenic bladder as      a result of diabetes mellitus.   RECOMMENDATIONS:  1. Agree with holding Macrodantin due to its potential toxicity, if      possible.  2. I want to consider long term treatment with cephazolin.  3. Will defer infectious disease  management to the infectious disease      specialist.  4. Hold hydrochlorothiazide and potassium given the current increased      azotemia and increased serum creatinine.  5. I would continue to hold the ARB.  6. Gentle intravenous fluids today.  7. Check orthostatic blood pressure.      Mindi Slicker. Lowell Guitar, M.D.  Electronically Signed     ACP/MEDQ  D:  06/12/2006  T:  06/12/2006  Job:  [id number]

## 2010-10-21 NOTE — Op Note (Signed)
NAMEGIORGIA, [name] NO.:  [id number]   MEDICAL RECORD NO.:  [id number]          PATIENT TYPE:  INP   LOCATION:  6522                         FACILITY:  [facility]   PHYSICIAN:  Darlin Priestly, MD  DATE OF BIRTH:  06-04-1925   DATE OF PROCEDURE:  04/12/2006  DATE OF DISCHARGE:                                 OPERATIVE REPORT   PROCEDURES:  Insertion of a Medtronic EnRhythm, [id number] generator, serial  number [id number], with passive atrial and ventricular leads.   ATTENDING:  Darlin Priestly, MD   COMPLICATIONS:  None.   INDICATION:  Ms. Parker is a 77 year old female, mother of [name] and  a patient of Dr. Lavonne Chick and Dr. Pete Glatter, with a history of paroxysmal  atrial fibrillation, currently maintaining sinus rhythm on flecainide,  history of COPD, felt to be a poor Coumadin candidate, who has had  intermittent episodes of presyncope with recent event monitor revealing high-  degree heart block, as well as several 3- to 4-second pauses.  She is now  referred for a dual-chamber pacer implant to allow rate control.   DESCRIPTION OF OPERATION:  After informed consent, the patient brought to  the cardiac cath lab, where left chest was prepped and draped in sterile  fashion.  ECG monitor established.  Lidocaine 1% was then used to  anesthetize the left mid subclavicular area.  Next, approximately a 3-cm  horizontal incision was carried out beneath the left clavicle, and blunt  dissection was used to carry this down to the left pectoral fascia.  Hemostasis was obtained with electrocautery.  The left subclavian vein was  then easily entered with a single stick, and a guidewire was then easily  passed into the SVC and right atrium.  Over this, a #9-French dilator and  sheath were inserted, and the dilator and guidewire were removed.  Through  this, a 52-cm passive Medtronic lead, model [id number], serial number [id number]  [id number], was then easily passed into the  right atrium.  Guidewire was retained.  The peel-away sheath was removed.  A 2nd 7-French dilator and sheath were  then placed over the retained guidewire, and dilator and guidewire were  removed.  Through this, a 45-cm Medtronic passive lead, model #5592, serial  number [id number], was then passed into the right atrium, and peel-away  sheath was removed.  A J-curve was then placed on the ventricular lead  stylet, and the ventricular lead was then used to cross the tricuspid valve  and positioned in the RV apex without difficulty.  Thresholds were then  determined.  R waves were measured at 12.8 millivolts.  Impedance 770 ohms.  Threshold in the ventricle was 0.5 volts at 0.5 milliseconds.  Ten volts  were negative for diaphragmatic stimulation.  Current was 0.7 milliamps.  The atrial lead was then allowed to form a J, and this was positioned in the  right atrial appendage.  Thresholds were then determined.  P waves measured  at 5.7 millivolts.  Impedance 613 ohms.  Threshold in the atrium is 0.6  volts at 0.5 milliseconds.  Current is  1.2 milliamps.  Again, 10 volts are  negative for diaphragmatic stimulation.  Two silk sutures were then placed  around each lead, and these leads were then secured to the pectoral fascia.  The pocket was then copiously irrigated with 1% gentamicin solution, and  again, hemostasis confirmed.  The leads were then connected in a serial  fashion to a Medtronic EnRhythm, [id number] generator, serial number [id number]  [id number].  Head screws were tightened, and pacing was confirmed.  A single silk  suture was then placed in the apex of the pocket.  The generator and leads  were then delivered into the pocket, and the header was secured to the silk  suture.  The subcutaneous skin layer was then closed using running 2-0  Vicryl.  The skin layer was then closed with running 4-0 Vicryl.  Steri-  Strips were applied.  The patient returned to the recovery room in stable   condition.   CONCLUSIONS:  Successful implant of a Medtronic EnRhythm [id number] generator,  serial number [id number], with passive atrial and ventricular leads.      Darlin Priestly, MD  Electronically Signed     RHM/MEDQ  D:  04/12/2006  T:  04/13/2006  Job:  [id number]   cc:   Hal T. Stoneking, M.D.  Madaline Savage, M.D.

## 2010-10-21 NOTE — Consult Note (Signed)
NAMEHELLEN, SHANLEY               ACCOUNT NO.:  [id number]   MEDICAL RECORD NO.:  [id number]          PATIENT TYPE:  INP   LOCATION:  4715                         FACILITY:  [facility]   PHYSICIAN:  Corinna L. Lendell Caprice, MDDATE OF BIRTH:  [birth year]   DATE OF CONSULTATION:  06/13/2006  DATE OF DISCHARGE:                                 CONSULTATION   REASON FOR CONSULTATION:  Diabetes, recurrent urinary tract infection  and assistance with other medical issues.   IMPRESSION/RECOMMENDATIONS:  1. Recurrent urinary tract infection now with Escherichia coli urinary      tract infection and severe dysuria and frequency:  I agree with      intravenous Rocephin.  Macrobid and Pyridium are contraindicated in      patients with creatinine clearance of less than 50.  I will      discontinue the Pyridium.  She is on Levsin and topical estrogen,      also Ditropan was started today and Dr. McDiarmid plans on doing a      cystoscopy and biopsy tomorrow.  The patient reports that her      discomfort is somewhat better at this time.  2. Diabetes with hypoglycemic episodes in the hospital:  I suspect her      diet is somewhat erratic and she has been getting sliding scale.  I      recommend holding sliding scale Humalog and decreasing meal      coverage Humalog.  I will also give a half a dose of Lantus tonight      for her procedure tomorrow.  3. Hypertension.  4. Coronary artery disease.  5. History of pacemaker.  6. History of atrial fibrillation, on flecainide.  7. History of anemia.  8. Acute-on-chronic renal insufficiency, nephrology following.   HISTORY OF PRESENT ILLNESS:  Ms. Cirigliano is a 77 year old white female  patient who was admitted to Dr. Roque Lias service with severe, dysuria,  frequency and apparently she has had recurrent urinary tract infections.  She has been on and off several courses of antibiotics over the past few  months.  Her pain has been intractable despite morphine,  Vicodin,  Pyridium, Levsin however, her pain at this moment is a bit better.  Her  primary care physician is Dr. Pete Glatter and [facility] Hospitalists are  consulted to assist with medical issues and any suggestions with regard  to her primary problem.  Dr. Lowell Guitar and Dr. McDiarmid as well as Dr.  Roxan Hockey have already been consulted and have made recommendations.   PAST MEDICAL HISTORY:  As above.   MEDICATIONS:  1. Ditropan ER 10 mg a day.  2. Flecainide 50 mg p.o. b.i.d.  3. Vytorin daily.  4. Folic acid 1 mg a day.  5. Colace 100 mg p.o. b.i.d.  6. Intravaginal Estradiol cream twice a week.  7. Elavil 25 mg p.o. q.h.s.  8. NovoLog insulin 10 units at 8 a.m., 12 units at noon and 6 p.m.  9. Nu-Iron 150 mg p.o. b.i.d.  10.Aspirin 81 mg a day.  11.Sliding scale NovoLog insulin.  12.Lantus 50 units subcutaneously at  10 a.m.  13.Pyridium 200 mg p.o. t.i.d.  14.Rocephin 1 gram IV q.24 h.  15.Ambien as needed.  16.Tylenol as needed.  17.Vicodin and morphine as needed.  18.Xanax as needed.   SOCIAL HISTORY:  Patient is a retired Engineer, civil (consulting).  She does not drink or  smoke.   ALLERGIES:  CODEINE, DARVOCET, PENICILLIN, SULFA.   FAMILY HISTORY:  Noncontributory.   REVIEW OF SYSTEMS:  As above, otherwise negative.   PHYSICAL EXAMINATION:  VITAL SIGNS:  Temperature is 98.6, pulse 85,  respiratory rate 20, blood pressure 97/50.  GENERAL:  The patient is well nourished, well developed in no acute  distress on the commode.  HEENT:  Normocephalic, atraumatic.  Pupils equal, round, reactive to  light slightly dry mucous membranes.  Neck is supple.  RESPIRATORY:  Lungs are clear to auscultation bilaterally without  wheezes, rhonchi or rales.  CARDIOVASCULAR:  Regular rate and rhythm without murmurs, gallops or  rubs.  ABDOMEN:  Normal bowel sounds, soft, nontender, nondistended.  GU AND RECTAL:  Deferred.  EXTREMITIES:  No clubbing, cyanosis or edema.  SKIN:  No rash.  PSYCHIATRIC:  Normal  affect.  NEUROLOGIC:  Alert and oriented.  Cranial nerves and sensorimotor exam  are intact.   LABORATORIES:  BMET today is significant for a glucose of 121, BUN 54,  creatinine 3.3.  I believe her baseline creatinine is usually 2.5.  Her  CBC yesterday revealed a normal white count, hemoglobin 11, hematocrit  33, platelets were clumped.  She had a renal ultrasound yesterday which  showed increased echogenicity and poor cortical medullary  differentiation, bilateral renal cortical cyst without hydronephrosis.   I would like to thank for Dr. Lamona Curl al. for this consultation.      Corinna L. Lendell Caprice, MD  Electronically Signed    CLS/MEDQ  D:  06/13/2006  T:  06/14/2006  Job:  [id number]   cc:   Hal T. Stoneking, M.D.  Mindi Slicker. Lowell Guitar, M.D.  Leighton Roach McDiarmid, M.D.  Madaline Savage, M.D.

## 2010-12-20 ENCOUNTER — Encounter (HOSPITAL_COMMUNITY): Payer: Medicare Other | Attending: Nephrology

## 2010-12-20 DIAGNOSIS — D509 Iron deficiency anemia, unspecified: Secondary | ICD-10-CM | POA: Insufficient documentation

## 2011-03-03 LAB — DIFFERENTIAL
Basophils Absolute: 0
Eosinophils Relative: 4
Lymphocytes Relative: 23
Neutro Abs: 4.5
Neutrophils Relative %: 63

## 2011-03-03 LAB — COMPREHENSIVE METABOLIC PANEL
AST: 31
BUN: 44 — ABNORMAL HIGH
CO2: 29
Chloride: 102
Creatinine, Ser: 2.22 — ABNORMAL HIGH
GFR calc non Af Amer: 21 — ABNORMAL LOW
Glucose, Bld: 110 — ABNORMAL HIGH
Total Bilirubin: 0.4

## 2011-03-03 LAB — CBC
HCT: 36.5
Hemoglobin: 12.3
MCV: 90.9
RBC: 4.02
WBC: 7.1

## 2011-03-15 LAB — I-STAT 8, (EC8 V) (CONVERTED LAB)
BUN: 39 — ABNORMAL HIGH
Bicarbonate: 29.4 — ABNORMAL HIGH
HCT: 41
Operator id: 288331
pCO2, Ven: 49

## 2011-03-15 LAB — B-NATRIURETIC PEPTIDE (CONVERTED LAB): Pro B Natriuretic peptide (BNP): 1115 — ABNORMAL HIGH

## 2011-03-15 LAB — DIFFERENTIAL
Basophils Relative: 0
Eosinophils Absolute: 0.1
Eosinophils Relative: 2
Monocytes Absolute: 0.5
Monocytes Relative: 7
Neutrophils Relative %: 80 — ABNORMAL HIGH

## 2011-03-15 LAB — CBC
Hemoglobin: 11.8 — ABNORMAL LOW
MCHC: 32.6
MCV: 89.1
RBC: 4.06

## 2011-06-08 ENCOUNTER — Ambulatory Visit (HOSPITAL_COMMUNITY)
Admission: RE | Admit: 2011-06-08 | Discharge: 2011-06-08 | Disposition: A | Discharge status: Home / Self Care | Payer: Medicare Other | Source: Ambulatory Visit | Attending: Nephrology | Admitting: Nephrology

## 2011-06-08 DIAGNOSIS — D509 Iron deficiency anemia, unspecified: Secondary | ICD-10-CM | POA: Insufficient documentation

## 2011-06-08 MED ORDER — FERUMOXYTOL INJECTION 510 MG/17 ML
510.0000 mg | Freq: Once | INTRAVENOUS | Status: AC
  Administered 2011-06-08: 510 mg via INTRAVENOUS
  Filled 2011-06-08: qty 17

## 2011-07-31 ENCOUNTER — Other Ambulatory Visit (HOSPITAL_COMMUNITY): Payer: Self-pay | Admitting: *Deleted

## 2011-08-02 ENCOUNTER — Encounter (HOSPITAL_COMMUNITY): Payer: Medicare Other

## 2011-08-03 ENCOUNTER — Encounter (HOSPITAL_COMMUNITY)
Admission: RE | Admit: 2011-08-03 | Discharge: 2011-08-03 | Disposition: A | Discharge status: Home / Self Care | Payer: Medicare Other | Source: Ambulatory Visit | Attending: Nephrology | Admitting: Nephrology

## 2011-08-03 DIAGNOSIS — D509 Iron deficiency anemia, unspecified: Secondary | ICD-10-CM | POA: Insufficient documentation

## 2011-08-03 MED ORDER — FERUMOXYTOL INJECTION 510 MG/17 ML
510.0000 mg | Freq: Once | INTRAVENOUS | Status: AC
  Administered 2011-08-03: 510 mg via INTRAVENOUS
  Filled 2011-08-03: qty 17

## 2011-10-20 ENCOUNTER — Other Ambulatory Visit (HOSPITAL_COMMUNITY): Payer: Self-pay | Admitting: *Deleted

## 2011-10-24 ENCOUNTER — Encounter (HOSPITAL_COMMUNITY)
Admission: RE | Admit: 2011-10-24 | Discharge: 2011-10-24 | Disposition: A | Discharge status: Home / Self Care | Payer: Medicare Other | Source: Ambulatory Visit | Attending: Nephrology | Admitting: Nephrology

## 2011-10-24 DIAGNOSIS — D509 Iron deficiency anemia, unspecified: Secondary | ICD-10-CM | POA: Insufficient documentation

## 2011-10-24 LAB — POCT HEMOGLOBIN-HEMACUE: Hemoglobin: 12.8 g/dL (ref 12.0–15.0)

## 2011-10-24 MED ORDER — EPOETIN ALFA 20000 UNIT/ML IJ SOLN: 15000.0000 [IU] | INTRAMUSCULAR | Status: DC

## 2011-11-14 ENCOUNTER — Encounter (HOSPITAL_COMMUNITY)
Admission: RE | Admit: 2011-11-14 | Discharge: 2011-11-14 | Disposition: A | Discharge status: Home / Self Care | Payer: Medicare Other | Source: Ambulatory Visit | Attending: Nephrology | Admitting: Nephrology

## 2011-11-14 DIAGNOSIS — D509 Iron deficiency anemia, unspecified: Secondary | ICD-10-CM | POA: Insufficient documentation

## 2011-11-14 MED ORDER — EPOETIN ALFA 20000 UNIT/ML IJ SOLN: 15000.0000 [IU] | INTRAMUSCULAR | Status: DC

## 2011-11-28 ENCOUNTER — Encounter (HOSPITAL_COMMUNITY): Payer: Medicare Other

## 2011-12-12 ENCOUNTER — Inpatient Hospital Stay (HOSPITAL_COMMUNITY): Admission: RE | Admit: 2011-12-12 | Payer: Medicare Other | Source: Ambulatory Visit

## 2011-12-12 ENCOUNTER — Encounter (HOSPITAL_COMMUNITY): Payer: Self-pay | Admitting: Neurology

## 2011-12-12 ENCOUNTER — Emergency Department (HOSPITAL_COMMUNITY): Payer: Medicare Other

## 2011-12-12 ENCOUNTER — Inpatient Hospital Stay (HOSPITAL_COMMUNITY)
Admission: EM | Admit: 2011-12-12 | Discharge: 2011-12-15 | DRG: 309 | Disposition: A | Discharge status: Home / Self Care | Payer: Medicare Other | Attending: Cardiovascular Disease | Admitting: Cardiovascular Disease

## 2011-12-12 DIAGNOSIS — I359 Nonrheumatic aortic valve disorder, unspecified: Secondary | ICD-10-CM

## 2011-12-12 DIAGNOSIS — N185 Chronic kidney disease, stage 5: Secondary | ICD-10-CM | POA: Diagnosis present

## 2011-12-12 DIAGNOSIS — I4891 Unspecified atrial fibrillation: Principal | ICD-10-CM | POA: Diagnosis present

## 2011-12-12 DIAGNOSIS — Z862 Personal history of diseases of the blood and blood-forming organs and certain disorders involving the immune mechanism: Secondary | ICD-10-CM

## 2011-12-12 DIAGNOSIS — Z87891 Personal history of nicotine dependence: Secondary | ICD-10-CM

## 2011-12-12 DIAGNOSIS — R55 Syncope and collapse: Secondary | ICD-10-CM | POA: Diagnosis present

## 2011-12-12 DIAGNOSIS — I5032 Chronic diastolic (congestive) heart failure: Secondary | ICD-10-CM | POA: Diagnosis present

## 2011-12-12 DIAGNOSIS — I480 Paroxysmal atrial fibrillation: Secondary | ICD-10-CM

## 2011-12-12 DIAGNOSIS — Z66 Do not resuscitate: Secondary | ICD-10-CM | POA: Diagnosis present

## 2011-12-12 DIAGNOSIS — N390 Urinary tract infection, site not specified: Secondary | ICD-10-CM | POA: Diagnosis present

## 2011-12-12 DIAGNOSIS — IMO0001 Reserved for inherently not codable concepts without codable children: Secondary | ICD-10-CM | POA: Diagnosis present

## 2011-12-12 DIAGNOSIS — D649 Anemia, unspecified: Secondary | ICD-10-CM | POA: Diagnosis present

## 2011-12-12 DIAGNOSIS — I120 Hypertensive chronic kidney disease with stage 5 chronic kidney disease or end stage renal disease: Secondary | ICD-10-CM | POA: Diagnosis present

## 2011-12-12 DIAGNOSIS — D638 Anemia in other chronic diseases classified elsewhere: Secondary | ICD-10-CM | POA: Diagnosis present

## 2011-12-12 DIAGNOSIS — I2510 Atherosclerotic heart disease of native coronary artery without angina pectoris: Secondary | ICD-10-CM | POA: Diagnosis present

## 2011-12-12 DIAGNOSIS — I350 Nonrheumatic aortic (valve) stenosis: Secondary | ICD-10-CM | POA: Diagnosis present

## 2011-12-12 DIAGNOSIS — I080 Rheumatic disorders of both mitral and aortic valves: Secondary | ICD-10-CM | POA: Diagnosis present

## 2011-12-12 DIAGNOSIS — E119 Type 2 diabetes mellitus without complications: Secondary | ICD-10-CM | POA: Diagnosis present

## 2011-12-12 DIAGNOSIS — I509 Heart failure, unspecified: Secondary | ICD-10-CM | POA: Diagnosis present

## 2011-12-12 DIAGNOSIS — Z950 Presence of cardiac pacemaker: Secondary | ICD-10-CM

## 2011-12-12 DIAGNOSIS — I340 Nonrheumatic mitral (valve) insufficiency: Secondary | ICD-10-CM | POA: Diagnosis present

## 2011-12-12 DIAGNOSIS — N184 Chronic kidney disease, stage 4 (severe): Secondary | ICD-10-CM | POA: Diagnosis present

## 2011-12-12 DIAGNOSIS — I495 Sick sinus syndrome: Secondary | ICD-10-CM

## 2011-12-12 HISTORY — DX: Personal history of diseases of the blood and blood-forming organs and certain disorders involving the immune mechanism: Z86.2

## 2011-12-12 HISTORY — DX: Atherosclerotic heart disease of native coronary artery without angina pectoris: I25.10

## 2011-12-12 HISTORY — DX: Type 2 diabetes mellitus without complications: E11.9

## 2011-12-12 HISTORY — DX: Long term (current) use of insulin: Z79.4

## 2011-12-12 HISTORY — DX: Nonrheumatic aortic valve disorder, unspecified: I35.9

## 2011-12-12 HISTORY — DX: Urinary tract infection, site not specified: N39.0

## 2011-12-12 HISTORY — DX: Reserved for inherently not codable concepts without codable children: IMO0001

## 2011-12-12 HISTORY — DX: Chronic kidney disease, stage 4 (severe): N18.4

## 2011-12-12 HISTORY — DX: Rheumatic mitral valve disease, unspecified: I05.9

## 2011-12-12 HISTORY — DX: Sick sinus syndrome: I49.5

## 2011-12-12 HISTORY — DX: Unspecified complication of cardiac and vascular prosthetic device, implant and graft, initial encounter: T82.9XXA

## 2011-12-12 HISTORY — DX: Paroxysmal atrial fibrillation: I48.0

## 2011-12-12 HISTORY — DX: Syncope and collapse: R55

## 2011-12-12 HISTORY — DX: Anemia, unspecified: D64.9

## 2011-12-12 HISTORY — DX: Nonrheumatic aortic (valve) stenosis: I35.0

## 2011-12-12 HISTORY — DX: Chronic combined systolic (congestive) and diastolic (congestive) heart failure: I50.42

## 2011-12-12 HISTORY — DX: Presence of cardiac pacemaker: Z95.0

## 2011-12-12 LAB — CBC WITH DIFFERENTIAL/PLATELET
Basophils Absolute: 0 10*3/uL (ref 0.0–0.1)
Basophils Relative: 0 % (ref 0–1)
Eosinophils Relative: 3 % (ref 0–5)
HCT: 41.6 % (ref 36.0–46.0)
MCHC: 32.5 g/dL (ref 30.0–36.0)
Monocytes Absolute: 0.6 10*3/uL (ref 0.1–1.0)
Neutro Abs: 5.1 10*3/uL (ref 1.7–7.7)
Platelets: 200 10*3/uL (ref 150–400)
RDW: 14.5 % (ref 11.5–15.5)

## 2011-12-12 LAB — COMPREHENSIVE METABOLIC PANEL
AST: 33 U/L (ref 0–37)
Albumin: 3.9 g/dL (ref 3.5–5.2)
Calcium: 10.2 mg/dL (ref 8.4–10.5)
Chloride: 97 mEq/L (ref 96–112)
Creatinine, Ser: 2.51 mg/dL — ABNORMAL HIGH (ref 0.50–1.10)
Sodium: 133 mEq/L — ABNORMAL LOW (ref 135–145)

## 2011-12-12 LAB — GLUCOSE, CAPILLARY

## 2011-12-12 LAB — TROPONIN I: Troponin I: <0.3 ng/mL (ref <0.30)

## 2011-12-12 LAB — CBC
HCT: 40.1 % (ref 36.0–46.0)
Hemoglobin: 12.9 g/dL (ref 12.0–15.0)
MCH: 30.9 pg (ref 26.0–34.0)
MCV: 95.9 fL (ref 78.0–100.0)
Platelets: 217 10*3/uL (ref 150–400)
RBC: 4.18 MIL/uL (ref 3.87–5.11)
WBC: 7.1 10*3/uL (ref 4.0–10.5)

## 2011-12-12 LAB — PRO B NATRIURETIC PEPTIDE: Pro B Natriuretic peptide (BNP): 3130 pg/mL — ABNORMAL HIGH (ref 0–450)

## 2011-12-12 LAB — URINE MICROSCOPIC-ADD ON

## 2011-12-12 LAB — URINALYSIS, ROUTINE W REFLEX MICROSCOPIC
Nitrite: NEGATIVE
Specific Gravity, Urine: 1.012 (ref 1.005–1.030)
pH: 6 (ref 5.0–8.0)

## 2011-12-12 LAB — CREATININE, SERUM: GFR calc Af Amer: 19 mL/min — ABNORMAL LOW (ref >90)

## 2011-12-12 MED ORDER — FOLIC ACID 800 MCG PO TABS: 800.0000 ug | ORAL_TABLET | Freq: Every day | ORAL | Status: DC

## 2011-12-12 MED ORDER — FOSFOMYCIN TROMETHAMINE 3 G PO PACK
3.0000 g | PACK | ORAL | Status: AC
  Administered 2011-12-12: 3 g via ORAL
  Filled 2011-12-12: qty 3

## 2011-12-12 MED ORDER — FOLIC ACID 1 MG PO TABS
1.0000 mg | ORAL_TABLET | Freq: Every day | ORAL | Status: DC
  Administered 2011-12-13 – 2011-12-15 (×3): 1 mg via ORAL
  Filled 2011-12-12 (×3): qty 1

## 2011-12-12 MED ORDER — INSULIN GLARGINE 100 UNIT/ML ~~LOC~~ SOLN
18.0000 [IU] | Freq: Every day | SUBCUTANEOUS | Status: DC
  Administered 2011-12-13 – 2011-12-15 (×3): 18 [IU] via SUBCUTANEOUS

## 2011-12-12 MED ORDER — FUROSEMIDE 10 MG/ML IJ SOLN
120.0000 mg | INTRAVENOUS | Status: AC
  Administered 2011-12-12: 120 mg via INTRAVENOUS
  Filled 2011-12-12 (×2): qty 12

## 2011-12-12 MED ORDER — SODIUM CHLORIDE 0.9 % IJ SOLN
3.0000 mL | Freq: Two times a day (BID) | INTRAMUSCULAR | Status: DC
  Administered 2011-12-12 – 2011-12-14 (×5): 3 mL via INTRAVENOUS

## 2011-12-12 MED ORDER — INSULIN ASPART 100 UNIT/ML ~~LOC~~ SOLN
0.0000 [IU] | Freq: Three times a day (TID) | SUBCUTANEOUS | Status: DC
  Administered 2011-12-14: 1 [IU] via SUBCUTANEOUS
  Administered 2011-12-15: 2 [IU] via SUBCUTANEOUS

## 2011-12-12 MED ORDER — SODIUM CHLORIDE 0.9 % IJ SOLN: 3.0000 mL | Freq: Two times a day (BID) | INTRAMUSCULAR | Status: DC

## 2011-12-12 MED ORDER — CALCITRIOL 0.25 MCG PO CAPS
0.2500 ug | ORAL_CAPSULE | Freq: Every day | ORAL | Status: DC
  Administered 2011-12-13 – 2011-12-15 (×3): 0.25 ug via ORAL
  Filled 2011-12-12 (×3): qty 1

## 2011-12-12 MED ORDER — EZETIMIBE-SIMVASTATIN 10-20 MG PO TABS
1.0000 | ORAL_TABLET | Freq: Every day | ORAL | Status: DC
  Administered 2011-12-12 – 2011-12-14 (×3): 1 via ORAL
  Filled 2011-12-12 (×4): qty 1

## 2011-12-12 MED ORDER — ASPIRIN EC 81 MG PO TBEC: 81.0000 mg | DELAYED_RELEASE_TABLET | Freq: Every day | ORAL | Status: DC

## 2011-12-12 MED ORDER — AMIODARONE HCL 200 MG PO TABS
400.0000 mg | ORAL_TABLET | Freq: Two times a day (BID) | ORAL | Status: DC
  Administered 2011-12-12 – 2011-12-15 (×6): 400 mg via ORAL
  Filled 2011-12-12 (×7): qty 2

## 2011-12-12 MED ORDER — POTASSIUM CHLORIDE ER 10 MEQ PO TBCR
10.0000 meq | EXTENDED_RELEASE_TABLET | Freq: Every day | ORAL | Status: DC
  Administered 2011-12-13 – 2011-12-15 (×3): 10 meq via ORAL
  Filled 2011-12-12 (×3): qty 1

## 2011-12-12 MED ORDER — ZOLPIDEM TARTRATE 5 MG PO TABS
5.0000 mg | ORAL_TABLET | Freq: Every evening | ORAL | Status: DC | PRN
  Administered 2011-12-12 – 2011-12-14 (×3): 5 mg via ORAL
  Filled 2011-12-12 (×3): qty 1

## 2011-12-12 MED ORDER — FUROSEMIDE 40 MG PO TABS
40.0000 mg | ORAL_TABLET | Freq: Every day | ORAL | Status: DC
  Administered 2011-12-13 – 2011-12-14 (×2): 40 mg via ORAL
  Filled 2011-12-12 (×3): qty 1

## 2011-12-12 MED ORDER — SODIUM CHLORIDE 0.9 % IV SOLN: 250.0000 mL | INTRAVENOUS | Status: DC | PRN

## 2011-12-12 MED ORDER — HEPARIN SODIUM (PORCINE) 5000 UNIT/ML IJ SOLN
5000.0000 [IU] | Freq: Three times a day (TID) | INTRAMUSCULAR | Status: DC
  Administered 2011-12-12 – 2011-12-15 (×8): 5000 [IU] via SUBCUTANEOUS
  Filled 2011-12-12 (×11): qty 1

## 2011-12-12 MED ORDER — IRBESARTAN 150 MG PO TABS
150.0000 mg | ORAL_TABLET | Freq: Every day | ORAL | Status: DC
  Administered 2011-12-13 – 2011-12-14 (×2): 150 mg via ORAL
  Filled 2011-12-12 (×3): qty 1

## 2011-12-12 MED ORDER — ASPIRIN EC 81 MG PO TBEC
81.0000 mg | DELAYED_RELEASE_TABLET | Freq: Every day | ORAL | Status: DC
  Administered 2011-12-13 – 2011-12-15 (×3): 81 mg via ORAL
  Filled 2011-12-12 (×3): qty 1

## 2011-12-12 MED ORDER — CIPROFLOXACIN HCL 250 MG PO TABS
250.0000 mg | ORAL_TABLET | Freq: Two times a day (BID) | ORAL | Status: AC
  Administered 2011-12-12 – 2011-12-14 (×4): 250 mg via ORAL
  Filled 2011-12-12 (×4): qty 1

## 2011-12-12 MED ORDER — SODIUM CHLORIDE 0.9 % IV BOLUS (SEPSIS)
1000.0000 mL | Freq: Once | INTRAVENOUS | Status: AC
  Administered 2011-12-12: 1000 mL via INTRAVENOUS

## 2011-12-12 MED ORDER — SODIUM CHLORIDE 0.9 % IJ SOLN: 3.0000 mL | INTRAMUSCULAR | Status: DC | PRN

## 2011-12-12 NOTE — ED Notes (Signed)
Cardiology at bedside.

## 2011-12-12 NOTE — ED Provider Notes (Signed)
History     CSN: [id number]  Arrival date & time 12/12/11  1249   First MD Initiated Contact with Patient 12/12/11 1254      Chief Complaint  Patient presents with  . Weakness    (Consider location/radiation/quality/duration/timing/severity/associated sxs/prior treatment) HPI The patient presents with weakness.  She notes that since awakening, approximately 7 hours ago she has been profoundly weak.  She notes that upon awakening she was also diaphoretic, though this seems improved.  She notes that yesterday she was in her usual state of health, though she had significant amounts of exertion MI in her condition was not working.  Today, after his symptoms developed, they have been persistent, without any accompanying pain, disorientation, nausea, vomiting, dysuria. Notably, the patient has history of significant aortic stenosis, and is currently taking antibiotics for a previously diagnosed UTI. Past Medical History  Diagnosis Date  . Diabetes mellitus   . Renal disorder   . Aortic stenosis   . Mitral valve disorder   . Arrhythmia   . Pacemaker     Past Surgical History  Procedure Date  . Pacemaker insertion     No family history on file.  History  Substance Use Topics  . Smoking status: Former Games developer  . Smokeless tobacco: Not on file  . Alcohol Use: No    OB History    Grav Para Term Preterm Abortions TAB SAB Ect Mult Living                  Review of Systems  Constitutional:       HPI  HENT:       HPI otherwise negative  Eyes: Negative.   Respiratory:       HPI, otherwise negative  Cardiovascular:       HPI, otherwise nmegative  Gastrointestinal: Negative for vomiting.  Genitourinary:       HPI, otherwise negative  Musculoskeletal:       HPI, otherwise negative  Skin: Negative.   Neurological: Negative for syncope.    Allergies  Floxin; Sulfa antibiotics; Darvocet; and Penicillins  Home Medications   Current Outpatient Rx  Name Route Sig  Dispense Refill  . ASPIRIN EC 81 MG PO TBEC Oral Take 81 mg by mouth daily.    Marland Kitchen CALCITRIOL 0.25 MCG PO CAPS Oral Take 0.25 mcg by mouth daily.    . CEPHALEXIN 250 MG PO CAPS Oral Take 250 mg by mouth daily.    Marland Kitchen CIPROFLOXACIN HCL 250 MG PO TABS Oral Take 250 mg by mouth 2 (two) times daily.    Marland Kitchen DOCUSATE SODIUM 100 MG PO CAPS Oral Take 200 mg by mouth 2 (two) times daily.    Marland Kitchen ESTRADIOL 10 MCG VA TABS Vaginal Place 1 tablet vaginally once a week. Insert on Wednesdays.    Marland Kitchen EZETIMIBE-SIMVASTATIN 10-40 MG PO TABS Oral Take 1 tablet by mouth at bedtime.    Marland Kitchen FLECAINIDE ACETATE 50 MG PO TABS Oral Take 50 mg by mouth 2 (two) times daily.    Marland Kitchen FOLIC ACID 800 MCG PO TABS Oral Take 800 mcg by mouth daily.    . FUROSEMIDE 40 MG PO TABS Oral Take 40 mg by mouth daily.    . INSULIN ASPART 100 UNIT/ML [place] SOLN Subcutaneous Inject 10 Units into the skin 3 (three) times daily before meals.    . INSULIN GLARGINE 100 UNIT/ML [place] SOLN Subcutaneous Inject 18 Units into the skin at bedtime.    Marland Kitchen POTASSIUM CHLORIDE ER 10 MEQ  PO TBCR Oral Take 10 mEq by mouth daily.    . TELMISARTAN 40 MG PO TABS Oral Take 40 mg by mouth daily.      BP 159/45  Pulse 63  Temp 97.7 F (36.5 C)  Resp 16  SpO2 97%  Physical Exam  Nursing note and vitals reviewed. Constitutional: She is oriented to person, place, and time. She appears well-developed and well-nourished. No distress.  HENT:  Head: Normocephalic and atraumatic.  Eyes: Conjunctivae and EOM are normal.  Cardiovascular: Normal rate and regular rhythm.   Murmur heard.  Systolic murmur is present  Pulmonary/Chest: Effort normal and breath sounds normal. No stridor. No respiratory distress.  Abdominal: She exhibits no distension.  Musculoskeletal: She exhibits no edema.  Neurological: She is alert and oriented to person, place, and time. No cranial nerve deficit.  Skin: Skin is warm and dry.  Psychiatric: She has a normal mood and affect.    ED Course    Procedures (including critical care time)   Labs Reviewed  CBC WITH DIFFERENTIAL  COMPREHENSIVE METABOLIC PANEL  LIPASE, BLOOD  TROPONIN I  URINALYSIS, ROUTINE W REFLEX MICROSCOPIC  PRO B NATRIURETIC PEPTIDE   No results found.   No diagnosis found.  Cardiac 60 irregular wide, ABNORMAL Pulse oximetry 97% room air normal   Date: 12/12/2011  Rate: 58  Rhythm: sinus arrhythmia  QRS Axis: left  Intervals: QT prolonged  ST/T Wave abnormalities: nonspecific ST/T changes  Conduction Disutrbances:nonspecific intraventricular conduction delay  Narrative Interpretation:   Old EKG Reviewed: changes noted  NOT PACED (prior) -ABNORMAL   We had the pacemaker interrogated.  There is demonstration of multiple episodes of atrial dysrhythmia at the time of the patient's most pronounced symptoms. MDM  This 78 year old female with multiple medical problems, including aortic stenosis, now presents with generalized sense of weakness, and on my exam is in no distress, though she has a notable atrial arrhythmia.  Given the patient's history, there is a suspicion of ongoing either infectious or cardiac etiology with some suspicion of UTI, given her recent treatment for this with Cipro.  The patient's labs are notable for demonstration of a UTI, persistent renal dysfunction, and elevated BNP.  We had the pacemaker interrogated this demonstrated atrial arrhythmia during the time of the patient's) symptoms.  Given all of these findings the patient was admitted for further evaluation and management.  Gerhard Munch, MD 12/12/11 1517

## 2011-12-12 NOTE — ED Notes (Signed)
Medtronic at bedside to interrogate pt pacemaker

## 2011-12-12 NOTE — ED Notes (Signed)
Pt provided apple juice. Dinner tray ordered.

## 2011-12-12 NOTE — ED Notes (Signed)
Attempt to move pt to CDU. Dr. Tresa Endo requesting pt to stay on this side, still evaluating patient.

## 2011-12-12 NOTE — ED Notes (Signed)
Per ems- Pt comes from home woke up this morning with weakness and dizziness. Pt reports air conditioner went out last night and was hot all night. No neuro deficits noted. Per caregiver reporting weaker than normal. Denying any pain. A x 4. At baseline pt walks with walker, pt ambulated for EMS without difficulty. 162/84, HR 72, 16 RR. CBG 153.

## 2011-12-12 NOTE — ED Notes (Signed)
Pt taken to Xray.

## 2011-12-12 NOTE — H&P (Signed)
Chief Complaint:  Chief Complaint  Patient presents with  . Weakness    HPI:  This is a 78 y.o. female with a past medical history significant for PAF, degenerative valve disease, medically managed aortic stenosis, DM-insulin dependent with CKD and HTN who presents to ED today with sudden onset of lightheadedness and weakness that started at 9:30AM this morning.  It continued to occur intermittently, each time lasting longer.  One episode lasted and didn't go away for over an hour at 11:00AM, prompting her to come in.  These episodes were not aggravated or relieved with change in body position, sitting or standing, were not associated with N/V, loss of consciousness, shortness of breath, chest pain or bladder or bowel incontinence.  She denies physical exertion or activity that morning, but her A/C had been broken in her home through the night and she had been woken earlier than normal.  Ms. Engelken ate breakfast and had 2 cups of coffee, her normal AM routine, prior to onset of symptoms.  She took her blood pressure and blood sugar which were not abnormally high and similar to her daily normal.  Patient denies having had similar episodes with these symptoms in the past, and has not been ill recently.  She has had her dual-chamber pacemaker in place since November of 2007, but the device has known atrial lead dysfunction with high pacing thresholds and maintained good sensing.   [employer] rep saw patient today in the ED and performed an interrogation and history reading which showed Afib/Atach today 6 times starting at 9:49 AM.  There are also episodes in her device history of similar occurences from July 04, 2011, which patient denies feeling symptomatic  She has been followed by [facility], where her daughter Irasema Chalk has practiced as an NP for many years.  Ms. Hakeem last visit was 02/14/2011, at which time patient was following up for a hospitalization related to her advanced  degenerative mitral valve disease in August 2012.  During that admission, patient was diagnosed via echo with significant aortic stenosis, possible diastolic dysfunction, mild to moderate LV hypertrophy with preserved function EF 50-55%.  When we last saw her, her only daily limitation was due to back pain, she also endorsed some SOB, NYHA functional class II.  Her pacemaker device interrogation did not reveal any episodes of AFib of ventricular arrhythmias.   PMHx:  Past Medical History  Diagnosis Date  . Diabetes mellitus, insulin dependent (IDDM), controlled   . Chronic kidney disease, stage 4, severely decreased GFR     likely related to diabetic and hypertensive nephropathy, no evidence of renal artery stenosis by duplex US in 2008  . Mitral valve disorder   . Pacemaker November 2007    s/p placement complete heart block status  . Near syncope 12/12/2011  . Aortic stenosis, moderate to severe 12/12/2011  . Chronic Degenerative Aortic Valve Disease 12/12/2011  . PAF (paroxysmal atrial fibrillation) 12/12/2011  . H/O hemolytic anemia due to drugs 12/12/2011  . SSS (sick sinus syndrome) with medtronic pacemaker, placed 6 years ago, with micro dislodgement of atrial lead, though still paces--for CHB and paf 12/12/2011  . UTI (lower urinary tract infection), curent and history of UTIs on Keflex daily 12/12/2011  . CAD (coronary artery disease), cardiac cath  12/12/2011  . Anemia, secondary to chronic disease 12/12/2011  . Pacemaker complications     Known chronic dysfunction of atrial lead with high pacing trhesholds but good sensing.    Past  Surgical History  Procedure Date  . Pacemaker insertion November 2007    Medtronic EnRhythm dual-chamber [id number]  . Cardiac catheterization 2005    60% stenosis of 1st diagnoal branch, 60-70% stenosis of posterolateral brach of distal RCA, preserved LV with EF 50-55%, diastolic dysfunction    FAMHx:  No family history on file.  SOCHx:   reports that she quit  smoking about 31 years ago. Her smoking use included Cigarettes. She has a 30 pack-year smoking history. She does not have any smokeless tobacco history on file. She reports that she does not drink alcohol or use illicit drugs.  ALLERGIES:  Allergies  Allergen Reactions  . Floxin (Ofloxacin) Other (See Comments)    nightmares  . Sulfa Antibiotics     Pt states she can't remember what the reaction was.  . Darvocet (Propoxyphene-Acetaminophen) Rash  . Penicillins Rash    ROS: A comprehensive review of systems was negative except for: as noted in HPI  Medications Current facility-administered medications:fosfomycin (MONUROL) packet 3 g, 3 g, Oral, To ER, Gerhard Munch, MD, 3 g at 12/12/11 1655;  furosemide (LASIX) 120 mg in dextrose 5 % 50 mL IVPB, 120 mg, Intravenous, To ER, Gerhard Munch, MD, 120 mg at 12/12/11 1508;  sodium chloride 0.9 % bolus 1,000 mL, 1,000 mL, Intravenous, Once, Gerhard Munch, MD, 1,000 mL at 12/12/11 1335 Current outpatient prescriptions:aspirin EC 81 MG tablet, Take 81 mg by mouth daily., Disp: , Rfl: ;  calcitRIOL (ROCALTROL) 0.25 MCG capsule, Take 0.25 mcg by mouth daily., Disp: , Rfl: ;  cephALEXin (KEFLEX) 250 MG capsule, Take 250 mg by mouth daily., Disp: , Rfl: ;  ciprofloxacin (CIPRO) 250 MG tablet, Take 250 mg by mouth 2 (two) times daily., Disp: , Rfl:  docusate sodium (COLACE) 100 MG capsule, Take 200 mg by mouth 2 (two) times daily., Disp: , Rfl: ;  Estradiol (VAGIFEM) 10 MCG TABS, Place 1 tablet vaginally once a week. Insert on Wednesdays., Disp: , Rfl: ;  ezetimibe-simvastatin (VYTORIN) 10-40 MG per tablet, Take 1 tablet by mouth at bedtime., Disp: , Rfl: ;  flecainide (TAMBOCOR) 50 MG tablet, Take 50 mg by mouth 2 (two) times daily., Disp: , Rfl:  folic acid (FOLVITE) 800 MCG tablet, Take 800 mcg by mouth daily., Disp: , Rfl: ;  furosemide (LASIX) 40 MG tablet, Take 40 mg by mouth daily., Disp: , Rfl: ;  insulin aspart (NOVOLOG) 100 UNIT/ML injection,  Inject 10 Units into the skin 3 (three) times daily before meals., Disp: , Rfl: ;  insulin glargine (LANTUS) 100 UNIT/ML injection, Inject 18 Units into the skin at bedtime., Disp: , Rfl:  potassium chloride (K-DUR) 10 MEQ tablet, Take 10 mEq by mouth daily., Disp: , Rfl: ;  telmisartan (MICARDIS) 40 MG tablet, Take 40 mg by mouth daily., Disp: , Rfl:   (Not in a hospital admission)  LABS/IMAGING: Results for orders placed during the hospital encounter of 12/12/11 (from the past 48 hour(s))  URINALYSIS, ROUTINE W REFLEX MICROSCOPIC     Status: Abnormal   Collection Time   12/12/11  1:23 PM      Component Value Range Comment   Color, Urine YELLOW  YELLOW    APPearance CLOUDY (*) CLEAR    Specific Gravity, Urine 1.012  1.005 - 1.030    pH 6.0  5.0 - 8.0    Glucose, UA NEGATIVE  NEGATIVE mg/dL    Hgb urine dipstick TRACE (*) NEGATIVE    Bilirubin Urine NEGATIVE  NEGATIVE  Ketones, ur NEGATIVE  NEGATIVE mg/dL    Protein, ur 30 (*) NEGATIVE mg/dL    Urobilinogen, UA 0.2  0.0 - 1.0 mg/dL    Nitrite NEGATIVE  NEGATIVE    Leukocytes, UA LARGE (*) NEGATIVE   URINE MICROSCOPIC-ADD ON     Status: Normal   Collection Time   12/12/11  1:23 PM      Component Value Range Comment   Squamous Epithelial / LPF RARE  RARE    WBC, UA 21-50  <3 WBC/hpf    RBC / HPF 0-2  <3 RBC/hpf   CBC WITH DIFFERENTIAL     Status: Normal   Collection Time   12/12/11  1:30 PM      Component Value Range Comment   WBC 7.6  4.0 - 10.5 K/uL    RBC 4.32  3.87 - 5.11 MIL/uL    Hemoglobin 13.5  12.0 - 15.0 g/dL    HCT 45.4  09.8 - 11.9 %    MCV 96.3  78.0 - 100.0 fL    MCH 31.3  26.0 - 34.0 pg    MCHC 32.5  30.0 - 36.0 g/dL    RDW 14.7  82.9 - 56.2 %    Platelets 200  150 - 400 K/uL    Neutrophils Relative 67  43 - 77 %    Neutro Abs 5.1  1.7 - 7.7 K/uL    Lymphocytes Relative 22  12 - 46 %    Lymphs Abs 1.6  0.7 - 4.0 K/uL    Monocytes Relative 8  3 - 12 %    Monocytes Absolute 0.6  0.1 - 1.0 K/uL    Eosinophils  Relative 3  0 - 5 %    Eosinophils Absolute 0.2  0.0 - 0.7 K/uL    Basophils Relative 0  0 - 1 %    Basophils Absolute 0.0  0.0 - 0.1 K/uL   COMPREHENSIVE METABOLIC PANEL     Status: Abnormal   Collection Time   12/12/11  1:30 PM      Component Value Range Comment   Sodium 133 (*) 135 - 145 mEq/L    Potassium 4.6  3.5 - 5.1 mEq/L    Chloride 97  96 - 112 mEq/L    CO2 20  19 - 32 mEq/L    Glucose, Bld 134 (*) 70 - 99 mg/dL    BUN 49 (*) 6 - 23 mg/dL    Creatinine, Ser 1.30 (*) 0.50 - 1.10 mg/dL    Calcium 86.5  8.4 - 10.5 mg/dL    Total Protein 7.3  6.0 - 8.3 g/dL    Albumin 3.9  3.5 - 5.2 g/dL    AST 33  0 - 37 U/L HEMOLYSIS AT THIS LEVEL MAY AFFECT RESULT   ALT 16  0 - 35 U/L    Alkaline Phosphatase 65  39 - 117 U/L    Total Bilirubin 0.3  0.3 - 1.2 mg/dL    GFR calc non Af Amer 17 (*) >90 mL/min    GFR calc Af Amer 19 (*) >90 mL/min   LIPASE, BLOOD     Status: Normal   Collection Time   12/12/11  1:30 PM      Component Value Range Comment   Lipase 25  11 - 59 U/L   TROPONIN I     Status: Normal   Collection Time   12/12/11  1:30 PM      Component Value Range Comment  Troponin I <0.30  <0.30 ng/mL   PRO B NATRIURETIC PEPTIDE     Status: Abnormal   Collection Time   12/12/11  1:30 PM      Component Value Range Comment   Pro B Natriuretic peptide (BNP) 3130.0 (*) 0 - 450 pg/mL    Dg Chest 2 View  12/12/2011  *RADIOLOGY REPORT*  Clinical Data: Dizziness.  Generalized weakness.  History of hypertension and diabetes.  Indwelling pacemaker.  CHEST - 2 VIEW  Comparison: Portable chest x-rays 06/19/2008, 03/29/2007.  Two-view chest x-ray 01/16/2008, 10/19/2006.  Findings: Cardiac silhouette mildly enlarged but stable.  Thoracic aorta atherosclerotic, unchanged.  Hilar and mediastinal contours otherwise unremarkable.  No subclavian dual lead transvenous pacemaker unchanged and appears intact.  Mild pulmonary venous hypertension without overt edema.  Prominent bronchovascular markings  diffusely and mild central peribronchial thickening, more prominent than on the prior examinations.  No confluent airspace consolidation.  No pleural effusion.  Healed right rib fractures again noted.  IMPRESSION: Stable mild cardiomegaly without pulmonary edema.  Mild to moderate changes of acute bronchitis and/or asthma without localized airspace pneumonia.  Original Report Authenticated By: Arnell Sieving, M.D.    VITALS: Blood pressure 123/55, pulse 61, temperature 98 F (36.7 C), temperature source Oral, resp. rate 14, SpO2 98.00%.  EXAM: General appearance: alert, cooperative and mild distress [place]/AT Neck: no carotid bruit, no JVD, supple, symmetrical, trachea midline and thyroid not enlarged, symmetric, no tenderness/mass/nodules Lungs: bibasilar rales Heart: regularly irregular rhythm and systolic murmur: holosystolic 3/6, blowing at 2nd left intercostal space, radiates to carotids Abdomen: soft, non-tender; bowel sounds normal; no masses,  no organomegaly Pulses: 1+ and symmetric over posterior tibial and dorsalis pedis bilaterally Skin: no evidence of bleeding or bruising, temperature normal and texture normal Neurologic: Mental status: Alert, oriented, thought content appropriate Cranial nerves: II: visual acuity normal bilaterally, II: pupils equal, round, reactive to light and accommodation, VIII: hearing normal, IX: soft palate elevation normal in midline, XI: trapezius strength normal bilaterally, XI: sternocleidomastoid strength normal bilaterally, XII: tongue strength without fasiculation  Sensory: intact proximally and distally over extremeties and throughout Coordination: finger to nose normal bilaterally  EKG:  IMPRESSION: The primary encounter diagnosis was Near syncope. A diagnosis of CHF exacerbation was also pertinent to this visit.  PLAN: Orders Placed This Encounter  Procedures  . DG Chest 2 View  . CBC with Differential  . Comprehensive metabolic panel  .  Lipase, blood  . Troponin I  . Urinalysis, Routine w reflex microscopic  . Pro b natriuretic peptide (BNP)  . Urine microscopic-add on  . Cardiac monitoring  . Consult to cardiology  . Pulse oximetry, continuous  . EKG 12-Lead  . EKG 12-Lead    Long discussion with patient and daughter.  I am concerned that pt has developed symptomatic AS with CHF, episodes of mild chest discomfort and presyncope.  Today's episode was contributed by PAF.  Will recheck 2-d echo to reassess AS severity and LV function which may be deteriorating. May need to DC flecainide and change to amiodarone. Will gently diuresis with BNP >3000. Cr 2.51 which is stable followed by Dr. Missy Sabins.  Lennette Bihari, MD, [facility] 12/12/2011 6:38pm

## 2011-12-12 NOTE — ED Notes (Signed)
[employer] coming to interrogate pacemaker. Pt showing possible afib on monitor.

## 2011-12-13 ENCOUNTER — Inpatient Hospital Stay (HOSPITAL_COMMUNITY): Payer: Medicare Other

## 2011-12-13 DIAGNOSIS — R55 Syncope and collapse: Secondary | ICD-10-CM | POA: Diagnosis present

## 2011-12-13 LAB — CBC
HCT: 38.1 % (ref 36.0–46.0)
Hemoglobin: 12.4 g/dL (ref 12.0–15.0)
MCH: 31 pg (ref 26.0–34.0)
MCHC: 32.5 g/dL (ref 30.0–36.0)
MCV: 95.3 fL (ref 78.0–100.0)
Platelets: 192 10*3/uL (ref 150–400)
RBC: 4 MIL/uL (ref 3.87–5.11)
RDW: 14.4 % (ref 11.5–15.5)
WBC: 6.4 10*3/uL (ref 4.0–10.5)

## 2011-12-13 LAB — GLUCOSE, CAPILLARY
Glucose-Capillary: 90 mg/dL (ref 70–99)
Glucose-Capillary: 80 mg/dL (ref 70–99)

## 2011-12-13 LAB — COMPREHENSIVE METABOLIC PANEL
ALT: 17 U/L (ref 0–35)
AST: 26 U/L (ref 0–37)
Albumin: 3.5 g/dL (ref 3.5–5.2)
Alkaline Phosphatase: 60 U/L (ref 39–117)
BUN: 44 mg/dL — ABNORMAL HIGH (ref 6–23)
CO2: 26 mEq/L (ref 19–32)
Calcium: 9.7 mg/dL (ref 8.4–10.5)
Chloride: 101 mEq/L (ref 96–112)
Creatinine, Ser: 2.47 mg/dL — ABNORMAL HIGH (ref 0.50–1.10)
GFR calc Af Amer: 20 mL/min — ABNORMAL LOW (ref >90)
GFR calc non Af Amer: 17 mL/min — ABNORMAL LOW (ref >90)
Glucose, Bld: 75 mg/dL (ref 70–99)
Potassium: 3.3 mEq/L — ABNORMAL LOW (ref 3.5–5.1)
Sodium: 140 mEq/L (ref 135–145)
Total Bilirubin: 0.3 mg/dL (ref 0.3–1.2)
Total Protein: 6.5 g/dL (ref 6.0–8.3)

## 2011-12-13 LAB — TSH: TSH: 2.267 u[IU]/mL (ref 0.350–4.500)

## 2011-12-13 LAB — MAGNESIUM: Magnesium: 2.1 mg/dL (ref 1.5–2.5)

## 2011-12-13 LAB — PHOSPHORUS: Phosphorus: 3.3 mg/dL (ref 2.3–4.6)

## 2011-12-13 LAB — PRO B NATRIURETIC PEPTIDE: Pro B Natriuretic peptide (BNP): 4075 pg/mL — ABNORMAL HIGH (ref 0–450)

## 2011-12-13 MED ORDER — FUROSEMIDE 40 MG PO TABS
40.0000 mg | ORAL_TABLET | Freq: Once | ORAL | Status: AC
  Administered 2011-12-13: 40 mg via ORAL
  Filled 2011-12-13 (×2): qty 1

## 2011-12-13 MED ORDER — POTASSIUM CHLORIDE CRYS ER 20 MEQ PO TBCR
40.0000 meq | EXTENDED_RELEASE_TABLET | Freq: Once | ORAL | Status: AC
  Administered 2011-12-13: 40 meq via ORAL
  Filled 2011-12-13: qty 2

## 2011-12-13 NOTE — Progress Notes (Addendum)
Subjective:  [name] states that she is well rested and relieved today.  She slept through the evening and has not had any symptoms of lightheadedness and weakness that she was experiencing yesterday with A/V asynchrony in AFib.  She is able to stand and relax in bed without any discomfort.  She has been urinating regularly without burning or frank blood.  Notes some urgency with urination that is chronic, no polyuria.  ROS:  Denies any new symptoms including cough, SOB, chest pain, cold extremities, LOC, confusion or vision changes.   Objective:  Vital Signs in the last 24 hours: Temp:  [97.8 F (36.6 C)-98.4 F (36.9 C)] 97.8 F (36.6 C) (07/10 1316) Pulse Rate:  [45-71] 70  (07/10 1316) Resp:  [14-19] 18  (07/10 1316) BP: (120-176)/(54-83) 120/70 mmHg (07/10 1316) SpO2:  [94 %-99 %] 94 % (07/10 1316) Weight:  [63.2 kg (139 lb 5.3 oz)-64.3 kg (141 lb 12.1 oz)] 63.2 kg (139 lb 5.3 oz) (07/10 0509)  Intake/Output from previous day: 07/09 0701 - 07/10 0700 In: -  Out: 675 [Urine:675] Intake/Output from this shift: Total I/O In: 120 [P.O.:120] Out: 600 [Urine:600]  Physical Exam: General appearance: alert, cooperative, appears stated age and no distress Neck: no carotid bruit, no JVD, supple, symmetrical, trachea midline and thyroid not enlarged, symmetric, no tenderness/mass/nodules Lungs: find crackles bibasaly  Heart: regularly irregular rhythm and systolic murmur: harsh 3/6 Crescendo-decrescendo SEM heard throughout the precordium, radiates to carotids, with faint S2 heard better at base of LSB, non-displaced PMI Abdomen: soft, non-tender; bowel sounds normal; no masses,  no organomegaly Extremities: extremities normal, atraumatic, no cyanosis or edema, no edema, redness or tenderness in the calves or thighs, no ulcers, gangrene or trophic changes and varicose veins noted Pulses: 1+ over LE bilaterally Skin: skin pale white without increased pallor, two ecchymosis from IV over  left hand extensor surface, bullous pemphygitis lesion at L mid back (chronic condition) healing Neurologic: Alert and oriented X 3, normal strength and tone. Normal symmetric reflexes. Normal coordination and gait Mental status: Alert, oriented, thought content appropriate Cranial nerves: normal Sensory: normal Motor: grossly normal Coordination: test for rapid alternating movements normal Psych:  behavior appropriate and cooperative, mood is positive and affect is related  Lab Results:  Basename 12/13/11 0500 12/12/11 2037  WBC 6.4 7.1  HGB 12.4 12.9  PLT 192 217    Basename 12/13/11 0500 12/12/11 2037 12/12/11 1330  NA 140 -- 133*  K 3.3* -- 4.6  CL 101 -- 97  CO2 26 -- 20  GLUCOSE 75 -- 134*  BUN 44* -- 49*  CREATININE 2.47* 2.54* --   Cardiac Panel (last 3 results)  Basename 12/12/11 1330  CKTOTAL --  CKMB --  TROPONINI <0.30  RELINDX --   BNP (last 3 results)  Basename 12/13/11 0500 12/12/11 1330  PROBNP 4075.0* 3130.0*    Hepatic Function Panel  Basename 12/13/11 0500  PROT 6.5  ALBUMIN 3.5  AST 26  ALT 17  ALKPHOS 60  BILITOT 0.3  BILIDIR --  IBILI --   Tele - Mostly A-V paced Imaging:  *RADIOLOGY REPORT*  Clinical Data: Shortness of breath, CHF  PORTABLE CHEST - 1 VIEW  Comparison: Chest x-ray of 12/12/2011  Findings: The lungs appear clear and moderately well aerated.  Cardiomegaly is stable. A permanent pacemaker remains. Old right  rib fractures are noted.  IMPRESSION:  No active lung disease. Stable cardiomegaly with permanent  pacemaker.  Original Report Authenticated By: Juline Patch,  M.D.   Cardiac Studies: Pacemaker interrogation performed 12/12/11 in ED on admission revealed 6 episodes of AF/AT during time that patient was symptomatic.  EKG confirmed at that time on admission.   Assessment/Plan:  Principal Problem:  *Near syncope with AF/AT Active Problems:  Aortic stenosis, moderate to severe  PAF (paroxysmal atrial  fibrillation)  SSS (sick sinus syndrome) with medtronic pacemaker, placed 6 years ago, with micro dislodgement of atrial lead, though still paces--for CHB and paf  Mitral regurgitation  Diabetes mellitus, insulin dependent (IDDM), controlled  UTI (lower urinary tract infection), curent and history of UTIs on Keflex daily  CAD (coronary artery disease), cardiac cath   History of drug-induced hemolytic anemia quinine  Anemia of chronic disease  Believe likely etiology of Afib arrhythmia episodes could have been due to urinary tract infection. She feels fine today.  No c/o SOB directly.  Plan to give additional dose of  40mg  Lasix given 1000pt increase in BNP from yesterday. Continue to treat UTI with ciprofloxacin day 5 of 5, observe results from culture for sensitivity matching and will change if resistance is noted.   Response to d/c flecainide has been good with replacement of amiodarone tolerated well.  Continue according to plan.    Discuss plan for discharge with follow up in a few months to review options for treatment with approaching end of pacemaker life.  Repeat EKG in AM with cardiac markers and BNP.  Monitor I/O for confirmation of diuresis.    Had Echocardiogram performed a little while ago - not ready for reading as yet.  Would like to have the results & monitor at leas one more day on Amiodarone & with increased diuretic.  She has been more SOB/DOE over the past few months & has had to have ARB reduced.  Provided that she has no more episodes of Afib or near syncope, I would anticipate that she would be ready for discharge as early as tomorrow.    Marykay Lex, M.D., M.S. [facility] [address] [place], [place]  [address]  [phone] Pager # [phone]  12/13/2011 4:22 PM

## 2011-12-13 NOTE — Care Management Note (Unsigned)
    Page 1 of 1   12/13/2011     10:57:44 AM   CARE MANAGEMENT NOTE 12/13/2011  Patient:  CONSEPCION, UTT   Account Number:  [id number]  Date Initiated:  12/13/2011  Documentation initiated by:  SIMMONS,Camary Sosa  Subjective/Objective Assessment:   ADMITTED WITH PRE SYNCOPE; LIVES AT HOME WITH DAUGHTER- LAURA; HAS CANE AND RW; USES [facility] FOR RX.     Action/Plan:   DISCHARGE PLANNING DISCUSSED AT BEDSIDE.   Anticipated DC Date:  12/14/2011   Anticipated DC Plan:  HOME/SELF CARE      DC Planning Services  CM consult      Choice offered to / List presented to:             Status of service:  In process, will continue to follow Medicare Important Message given?   (If response is "NO", the following Medicare IM given date fields will be blank) Date Medicare IM given:   Date Additional Medicare IM given:    Discharge Disposition:    Per UR Regulation:  Reviewed for med. necessity/level of care/duration of stay  If discussed at Long Length of Stay Meetings, dates discussed:    Comments:  12/13/11  1056  Enzio Buchler SIMMONS RN, BSN [phone] NCM WILL FOLLOW.

## 2011-12-13 NOTE — Progress Notes (Signed)
  Echocardiogram 2D Echocardiogram has been performed.  [place] 12/13/2011, 3:03 PM

## 2011-12-14 DIAGNOSIS — N185 Chronic kidney disease, stage 5: Secondary | ICD-10-CM | POA: Diagnosis present

## 2011-12-14 LAB — URINE CULTURE: Culture: NO GROWTH

## 2011-12-14 LAB — CBC
MCH: 31.1 pg (ref 26.0–34.0)
Platelets: 189 10*3/uL (ref 150–400)
RBC: 3.96 MIL/uL (ref 3.87–5.11)
RDW: 14.5 % (ref 11.5–15.5)
WBC: 6.3 10*3/uL (ref 4.0–10.5)

## 2011-12-14 LAB — BASIC METABOLIC PANEL
Calcium: 9.4 mg/dL (ref 8.4–10.5)
Creatinine, Ser: 2.83 mg/dL — ABNORMAL HIGH (ref 0.50–1.10)
GFR calc non Af Amer: 14 mL/min — ABNORMAL LOW (ref >90)
Sodium: 138 mEq/L (ref 135–145)

## 2011-12-14 LAB — PRO B NATRIURETIC PEPTIDE: Pro B Natriuretic peptide (BNP): 4358 pg/mL — ABNORMAL HIGH (ref 0–450)

## 2011-12-14 LAB — GLUCOSE, CAPILLARY
Glucose-Capillary: 85 mg/dL (ref 70–99)
Glucose-Capillary: 125 mg/dL — ABNORMAL HIGH (ref 70–99)

## 2011-12-14 MED ORDER — ACETAMINOPHEN 325 MG PO TABS: 650.0000 mg | ORAL_TABLET | ORAL | Status: DC | PRN

## 2011-12-14 NOTE — Clinical Documentation Improvement (Signed)
CHF DOCUMENTATION CLARIFICATION QUERY  THIS DOCUMENT IS NOT A PERMANENT PART OF THE MEDICAL RECORD  TO RESPOND TO THE THIS QUERY, FOLLOW THE INSTRUCTIONS BELOW:  1. If needed, update documentation for the patient's encounter via the notes activity.  2. Access this query again and click edit on the In [employer].  3. After updating, or not, click F2 to complete all highlighted (required) fields concerning your review. Select "additional documentation in the medical record" OR "no additional documentation provided".  4. Click Sign note button.  5. The deficiency will fall out of your In Basket *Please let us know if you are not able to complete this workflow by phone or e-mail (listed below).  Please update your documentation within the medical record to reflect your response to this query.                                                                                    12/14/11  Dear [facility],  In a better effort to capture your patient's severity of illness, reflect appropriate length of stay and utilization of resources, a review of the patient medical record has revealed the following indicators the diagnosis of Heart Failure.    Based on your clinical judgment, please clarify and document in a progress note and/or discharge summary the clinical condition associated with the following supporting information:  In responding to this query please exercise your independent judgment.  The fact that a query is asked, does not imply that any particular answer is desired or expected.  Possible Clinical Conditions?  Acute Systolic Congestive Heart Failure Acute Diastolic Congestive Heart Failure Acute Systolic & Diastolic Congestive Heart Failure Acute on Chronic Systolic Congestive Heart Failure Acute on Chronic Diastolic Congestive Heart Failure Acute on Chronic Systolic & Diastolic  Congestive Heart Failure Other Condition Cannot Clinically Determine  Supporting  Information:  Risk Factors: CHF exacerbation noted per 7/10 progress notes.  Signs & Symptoms: Patient reports more SOB/DOE over the past few months per 7/10 progress notes.  Diagnostics: 7/11: proBNP=  4358.0 7/10: proBNP=  4075.0  Treatment: 7/10:  Lasix 40mg  po daily 7/09:  Lasix 120mg  in dextrose 5% 50ml, IVPB  Reviewed: additional documentation in the medical record  Thank You,  Marciano Sequin,  Clinical Documentation Specialist:  Pager: [phone]  Health Information Management [place]

## 2011-12-14 NOTE — Progress Notes (Addendum)
Subjective: No complaints, slept all night.    Objective: Vital signs in last 24 hours: Temp:  [97.7 F (36.5 C)-98.1 F (36.7 C)] 97.7 F (36.5 C) (07/11 0607) Pulse Rate:  [59-70] 60  (07/11 0607) Resp:  [18] 18  (07/11 0607) BP: (109-142)/(63-74) 109/63 mmHg (07/11 0607) SpO2:  [94 %-99 %] 99 % (07/11 0607) Weight:  [63.2 kg (139 lb 5.3 oz)] 63.2 kg (139 lb 5.3 oz) (07/11 0607) Weight change: -1.1 kg (-2 lb 6.8 oz) Last BM Date: 12/12/11 Intake/Output from previous day: -1830   Wt. 63.2 down from 64.3 on admit though admit wt several hours after IV lasix. 07/10 0701 - 07/11 0700 In: 120 [P.O.:120] Out: 1950 [Urine:1950] Intake/Output this shift:    PE: General:alert and oriented, no complaints JVD 7 cm Heart:RRR 2/6 AS murmer Lungs:decreased BS at bases; no rales today ZOX:WRUE Ext:no edema    Lab Results:  Basename 12/14/11 0540 12/13/11 0500  WBC 6.3 6.4  HGB 12.3 12.4  HCT 37.5 38.1  PLT 189 192   BMET  Basename 12/14/11 0540 12/13/11 0500  NA 138 140  K 4.1 3.3*  CL 99 101  CO2 27 26  GLUCOSE 87 75  BUN 45* 44*  CREATININE 2.83* 2.47*  CALCIUM 9.4 9.7    Basename 12/12/11 1330  TROPONINI <0.30    No results found for this basename: CHOL,  HDL,  LDLCALC,  LDLDIRECT,  TRIG,  CHOLHDL   Lab Results  Component Value Date   HGBA1C 6.2* 12/13/2011     Lab Results  Component Value Date   TSH 2.267 12/13/2011    Hepatic Function Panel  Basename 12/13/11 0500  PROT 6.5  ALBUMIN 3.5  AST 26  ALT 17  ALKPHOS 60  BILITOT 0.3  BILIDIR --  IBILI --   No results found for this basename: CHOL in the last 72 hours No results found for this basename: PROTIME in the last 72 hours    EKG: Orders placed during the hospital encounter of 12/12/11  . EKG 12-LEAD  . EKG 12-LEAD  . EKG 12-LEAD  . EKG 12-LEAD    Studies/Results: Dg Chest 2 View  12/12/2011  *RADIOLOGY REPORT*  Clinical Data: Dizziness.  Generalized weakness.  History of hypertension  and diabetes.  Indwelling pacemaker.  CHEST - 2 VIEW  Comparison: Portable chest x-rays 06/19/2008, 03/29/2007.  Two-view chest x-ray 01/16/2008, 10/19/2006.  Findings: Cardiac silhouette mildly enlarged but stable.  Thoracic aorta atherosclerotic, unchanged.  Hilar and mediastinal contours otherwise unremarkable.  No subclavian dual lead transvenous pacemaker unchanged and appears intact.  Mild pulmonary venous hypertension without overt edema.  Prominent bronchovascular markings diffusely and mild central peribronchial thickening, more prominent than on the prior examinations.  No confluent airspace consolidation.  No pleural effusion.  Healed right rib fractures again noted.  IMPRESSION: Stable mild cardiomegaly without pulmonary edema.  Mild to moderate changes of acute bronchitis and/or asthma without localized airspace pneumonia.  Original Report Authenticated By: Arnell Sieving, M.D.   Dg Chest Port 1 View  12/13/2011  *RADIOLOGY REPORT*  Clinical Data: Shortness of breath, CHF  PORTABLE CHEST - 1 VIEW  Comparison: Chest x-ray of 12/12/2011  Findings: The lungs appear clear and moderately well aerated. Cardiomegaly is stable.  A permanent pacemaker remains.  Old right rib fractures are noted.  IMPRESSION: No active lung disease.  Stable cardiomegaly with permanent pacemaker.  Original Report Authenticated By: Juline Patch, M.D.    Medications: I have reviewed the  patient's current medications.    Marland Kitchen amiodarone  400 mg Oral BID  . aspirin EC  81 mg Oral Daily  . calcitRIOL  0.25 mcg Oral Daily  . ciprofloxacin  250 mg Oral BID  . ezetimibe-simvastatin  1 tablet Oral q1800  . folic acid  1 mg Oral Daily  . furosemide  40 mg Oral Daily  . furosemide  40 mg Oral Once  . heparin  5,000 Units Subcutaneous Q8H  . insulin aspart  0-9 Units Subcutaneous TID WC  . insulin glargine  18 Units Subcutaneous QHS  . irbesartan  150 mg Oral Daily  . potassium chloride  10 mEq Oral Daily  . potassium  chloride  40 mEq Oral Once  . sodium chloride  3 mL Intravenous Q12H  . sodium chloride  3 mL Intravenous Q12H   Assessment/Plan: Patient Active Problem List  Diagnosis  . Aortic stenosis, moderate to severe  . Mitral regurgitation  . PAF (paroxysmal atrial fibrillation)  . Diabetes mellitus, insulin dependent (IDDM), controlled  . History of drug-induced hemolytic anemia quinine  . SSS (sick sinus syndrome) with medtronic pacemaker, placed 6 years ago, with micro dislodgement of atrial lead, though still paces--for CHB and paf  . UTI (lower urinary tract infection), curent and history of UTIs on Keflex daily  . CAD (coronary artery disease), cardiac cath   . Anemia of chronic disease  . Near syncope with AF/AT  . CKD (chronic kidney disease) stage 5, GFR less than 15 ml/min   PLAN: awaiting 2D Echo. Cr. Climbing in pt with CKD stage4-5 that refuses dialysis. AS awaiting 2D Echo results Pro BNP continues to climb, no fluid on CXR, able to lie flat to sleep and was prior to admit.  Mostly AV pacing on monitor occ intrinsic atrial beat.  Ambulate in hall.  ? Discharge home once echo evaluated. Glucose has been low,  Has been loosing weight over last year lack of appetite   LOS: 2 days   Banta,LAURA R 12/14/2011, 8:43 AM    Patient seen and examined. Agree with assessment and plan.  EchoStudy Conclusions  - Left ventricle: The cavity size was at the upper limits of normal. Wall thickness was increased in a pattern of mild LVH. There was mild concentric hypertrophy. Systolic function was mildly reduced. The estimated ejection fraction was in the range of 40% to 50%. Probable moderate hypokinesis of the inferior myocardium. Doppler parameters are consistent with abnormal left ventricular relaxation (grade 1 diastolic dysfunction). Doppler parameters are consistent with both elevated ventricular end-diastolic filling pressure and elevated left atrial filling pressure. -  Aortic valve: Cusp separation was severely reduced. There was severe stenosis. Mild regurgitation. Valve area: 0.67cm^2(VTI). Valve area: 0.6cm^2 (Vmax). - Mitral valve: Severely calcified annulus. Moderate to severe regurgitation. Valve area by pressure half-time: 1.54cm^2. Valve area by continuity equation (using LVOT flow): 0.82cm^2. - Left atrium: The atrium was mildly to moderately dilated. - Right ventricle: The cavity size was mildly dilated. - Atrial septum: No defect or patent foramen ovale was identified. -Mild TR  Long discussion with patient. I reviewed echo data. She is adamant that she  does not want to be on dialysis or consider valve surgery. She is aware of increased mortality risk with medical therapy of symptomatic AS. Echo reveals decreased LV function and wall motion abnormality in RCA territory. Per request, medical therapy only.

## 2011-12-15 ENCOUNTER — Encounter (HOSPITAL_COMMUNITY): Payer: Self-pay | Admitting: Cardiology

## 2011-12-15 DIAGNOSIS — I5032 Chronic diastolic (congestive) heart failure: Secondary | ICD-10-CM | POA: Diagnosis present

## 2011-12-15 DIAGNOSIS — I509 Heart failure, unspecified: Secondary | ICD-10-CM | POA: Diagnosis present

## 2011-12-15 DIAGNOSIS — I5042 Chronic combined systolic (congestive) and diastolic (congestive) heart failure: Secondary | ICD-10-CM

## 2011-12-15 HISTORY — DX: Chronic combined systolic (congestive) and diastolic (congestive) heart failure: I50.42

## 2011-12-15 LAB — BASIC METABOLIC PANEL
BUN: 52 mg/dL — ABNORMAL HIGH (ref 6–23)
Chloride: 98 mEq/L (ref 96–112)
Creatinine, Ser: 3.14 mg/dL — ABNORMAL HIGH (ref 0.50–1.10)
GFR calc Af Amer: 15 mL/min — ABNORMAL LOW (ref >90)
Glucose, Bld: 93 mg/dL (ref 70–99)
Potassium: 4.3 mEq/L (ref 3.5–5.1)

## 2011-12-15 LAB — GLUCOSE, CAPILLARY
Glucose-Capillary: 90 mg/dL (ref 70–99)
Glucose-Capillary: 184 mg/dL — ABNORMAL HIGH (ref 70–99)

## 2011-12-15 MED ORDER — ACETAMINOPHEN 325 MG PO TABS: 650.0000 mg | ORAL_TABLET | ORAL | Status: DC | PRN

## 2011-12-15 MED ORDER — AMIODARONE HCL 400 MG PO TABS
400.0000 mg | ORAL_TABLET | Freq: Two times a day (BID) | ORAL | Status: DC
Start: 2011-12-15 — End: 2011-12-28

## 2011-12-15 MED ORDER — LACTULOSE 10 GM/15ML PO SOLN
20.0000 g | Freq: Every day | ORAL | Status: DC
  Administered 2011-12-15: 20 g via ORAL
  Filled 2011-12-15: qty 30

## 2011-12-15 MED ORDER — EZETIMIBE-SIMVASTATIN 10-20 MG PO TABS
1.0000 | ORAL_TABLET | Freq: Every day | ORAL | Status: DC
Start: 2011-12-15 — End: 2011-12-28

## 2011-12-15 NOTE — Progress Notes (Signed)
Subjective: No complaints, no dizziness - No BM.  Objective: Vital signs in last 24 hours: Temp:  [97.3 F (36.3 C)-98 F (36.7 C)] 97.3 F (36.3 C) (07/12 0523) Pulse Rate:  [57-64] 57  (07/12 0523) Resp:  [16-18] 16  (07/12 0523) BP: (114-160)/(46-68) 114/46 mmHg (07/12 0523) SpO2:  [96 %-98 %] 96 % (07/12 0523) Weight:  [63.322 kg (139 lb 9.6 oz)] 63.322 kg (139 lb 9.6 oz) (07/12 0523) Weight change: 0.122 kg (4.3 oz) Last BM Date: 12/12/11 Intake/Output from previous day: -230 wt stable at 63.3 down from admit of 64.3 07/11 0701 - 07/12 0700 In: 420 [P.O.:420] Out: 650 [Urine:650] Intake/Output this shift:    PE: General:alert and oriented Heart:S1S2 RRR 3/6 systolic murmur Lungs:cleat Abd:+ BS soft non tender Ext:no edema    Lab Results:  Basename 12/14/11 0540 12/13/11 0500  WBC 6.3 6.4  HGB 12.3 12.4  HCT 37.5 38.1  PLT 189 192   BMET  Basename 12/15/11 0530 12/14/11 0540  NA 137 138  K 4.3 4.1  CL 98 99  CO2 27 27  GLUCOSE 93 87  BUN 52* 45*  CREATININE 3.14* 2.83*  CALCIUM 9.6 9.4    Basename 12/12/11 1330  TROPONINI <0.30    No results found for this basename: CHOL, HDL, LDLCALC, LDLDIRECT, TRIG, CHOLHDL   Lab Results  Component Value Date   HGBA1C 6.2* 12/13/2011     Lab Results  Component Value Date   TSH 2.267 12/13/2011    Hepatic Function Panel  Basename 12/13/11 0500  PROT 6.5  ALBUMIN 3.5  AST 26  ALT 17  ALKPHOS 60  BILITOT 0.3  BILIDIR --  IBILI --   No results found for this basename: CHOL in the last 72 hours No results found for this basename: PROTIME in the last 72 hours    EKG: Orders placed during the hospital encounter of 12/12/11  . EKG 12-LEAD  . EKG 12-LEAD  . EKG 12-LEAD  . EKG 12-LEAD    Studies/Results: Left ventricle: The cavity size was at the upper limits of normal. Wall thickness was increased in a pattern of mild LVH. There was mild concentric hypertrophy. Systolic function was mildly  reduced. The estimated ejection fraction was in the range of 40% to 50%. Probable moderate hypokinesis of the inferior myocardium. Doppler parameters are consistent with abnormal left ventricular relaxation (grade 1 diastolic dysfunction). Doppler parameters are consistent with both elevated ventricular end-diastolic filling pressure and elevated left atrial filling pressure. - Aortic valve: Cusp separation was severely reduced. There was severe stenosis. Mild regurgitation. Valve area: 0.67cm^2(VTI). Valve area: 0.6cm^2 (Vmax). - Mitral valve: Severely calcified annulus. Moderate to severe regurgitation. Valve area by pressure half-time: 1.54cm^2. Valve area by continuity equation (using LVOT flow): 0.82cm^2. - Left atrium: The atrium was mildly to moderately dilated. - Right ventricle: The cavity size was mildly dilated. - Atrial septum: No defect or patent foramen ovale was identified.    Medications: I have reviewed the patient's current medications.    Marland Kitchen amiodarone  400 mg Oral BID  . aspirin EC  81 mg Oral Daily  . calcitRIOL  0.25 mcg Oral Daily  . ciprofloxacin  250 mg Oral BID  . ezetimibe-simvastatin  1 tablet Oral q1800  . folic acid  1 mg Oral Daily  . heparin  5,000 Units Subcutaneous Q8H  . insulin aspart  0-9 Units Subcutaneous TID WC  . insulin glargine  18 Units Subcutaneous QHS  . potassium chloride  10 mEq Oral Daily  . sodium chloride  3 mL Intravenous Q12H  . sodium chloride  3 mL Intravenous Q12H  . DISCONTD: furosemide  40 mg Oral Daily  . DISCONTD: irbesartan  150 mg Oral Daily   Assessment/Plan: Patient Active Problem List  Diagnosis  . Aortic stenosis, moderate to severe  . Mitral regurgitation  . PAF (paroxysmal atrial fibrillation)  . Diabetes mellitus, insulin dependent (IDDM), controlled  . History of drug-induced hemolytic anemia quinine  . SSS (sick sinus syndrome) with medtronic pacemaker, placed 6 years ago, with micro dislodgement of  atrial lead, though still paces--for CHB and paf  . UTI (lower urinary tract infection), curent and history of UTIs on Keflex daily  . CAD (coronary artery disease), cardiac cath   . Anemia of chronic disease  . Near syncope with AF/AT  . CKD (chronic kidney disease) stage 5, GFR less than 15 ml/min   PLAN: now with extra diuresis S Cr. Has elevated.  Will hold Lasix, hold avapro,  Discussed with Dr. Royann Shivers and he believes BNP  Elevation related to her valvular disease.  Her normal Pro BNP with her renal disease and severe aortic disease Pt has refused dialysis.  ? Need for renal consult- Dr. Arlyn Leak here and discussed with him, he is fine with her going home holding , Lasix and ARB.  Their office will check Cr. The first of the week.  Con't amiodarone for 10-14 day load.  Will give Lactulose, for BM.   LOS: 3 days   Longan,Vernestine Brodhead R 12/15/2011, 7:50 AM

## 2011-12-15 NOTE — Progress Notes (Signed)
Pt. Seen and examined. Agree with the NP/PA-C note as written.  Creatinine has increased slightly. Nephrology is okay with discharge and outpatient follow-up and holding diuretics. She wishes continued DNR and does not appear to be interested in pursuing surgical options for her aortic and mitral valve disease.  Follow-up with Dr. Royann Shivers in our office. Continue amiodarone loading orally - now off flecainide.  Chrystie Nose, MD, [facility] Attending Cardiologist [facility]

## 2011-12-15 NOTE — Progress Notes (Signed)
Pt ready for discharge. Gave discharge instructions and pt verbalized understanding. Removed IV, tolerated well and no bleeding noted.

## 2011-12-18 NOTE — Discharge Summary (Signed)
Physician Discharge Summary  Patient ID: [name] MRN: [id number] DOB/AGE: 1925/02/04 78 y.o.  Admit date: 12/12/2011 Discharge date: 12/15/2011  Discharge Diagnoses:  Principal Problem:  *Near syncope with AF/AT Active Problems:  Aortic stenosis, severe  Mitral regurgitation, moderate to severe  CKD (chronic kidney disease) stage 5, GFR less than 15 ml/min  PAF (paroxysmal atrial fibrillation)  Diabetes mellitus, insulin dependent (IDDM), controlled  Chronic combined systolic and diastolic CHF, NYHA class 2, in combination with AS, MR  SSS (sick sinus syndrome) with medtronic pacemaker, placed 6 years ago, with micro dislodgement of atrial lead, though still paces--for CHB and paf  UTI (lower urinary tract infection), curent and history of UTIs on Keflex daily  CAD (coronary artery disease), cardiac cath   Anemia of chronic disease  History of drug-induced hemolytic anemia quinine  CHF (congestive heart failure)   Discharged Condition: good  Hospital Course: This is a 78 y.o. female with a past medical history significant for PAF, degenerative valve disease, medically managed aortic stenosis, DM-insulin dependent with CKD and HTN who presents to ED  12/12/11 with sudden onset of lightheadedness and weakness that started at 9:30AM this morning. It continued to occur intermittently, each time lasting longer. One episode lasted and didn't go away for over an hour at 11:00AM, prompting her to come in. These episodes were not aggravated or relieved with change in body position, sitting or standing, were not associated with N/V, loss of consciousness, shortness of breath, chest pain or bladder or bowel incontinence. She denies physical exertion or activity that morning, but her A/C had been broken in her home through the night and she had been woken earlier than normal. Ms. Dieguez ate breakfast and had 2 cups of coffee, her normal AM routine, prior to onset of symptoms. She took her blood  pressure and blood sugar which were not abnormally high and similar to her daily normal. Patient denies having had similar episodes with these symptoms in the past, and has not been ill recently. She has had her dual-chamber pacemaker in place since November of 2007, but the device has known atrial lead dysfunction with high pacing thresholds and maintained good sensing. [employer] rep saw patient today in the ED and performed an interrogation and history reading which showed Afib/Atach today 6 times starting at 9:49 AM. There are also episodes in her device history of similar occurences from July 04, 2011, which patient denies feeling symptomatic   She has been followed by [facility], where her daughter Raffaella Edison has practiced as an NP for many years. Ms. January last visit was 02/14/2011, at which time patient was following up for a hospitalization related to her advanced degenerative mitral valve disease in August 2012. Patient has been diagnosed via echo with significant aortic stenosis, possible diastolic dysfunction, mild to moderate LV hypertrophy with preserved function EF 50-55%. When we last saw her, her only daily limitation was due to back pain, she also endorsed some SOB, NYHA functional class II. Her pacemaker device interrogation did not reveal any episodes of AFib of ventricular arrhythmias.   She was admitted for observation and her flecainide was DC'd and she was started on amiodarone 400 mg twice a day. Additionally her pro BNP was elevated and she was given 120 mg IV Lasix in the emergency room. By the next day she had elevation to 4000 with proBNP and another 40 mg by mouth Lasix was given.  She had no acute complaints of shortness of breath  her shortness of breath have been stable throughout hospitalization. Her creatinine began climbing on the day of discharge her Lasix and micardis were DC'd 22 creatinine of 3.1.  Dr. Darrick Penna is her nephrologist, results of  her basic metabolic panel were discussed with him and he agreed with the above plan.  Basic metabolic panel will need to be done 12/18/2011.  Patient was maintaining AV pacing.  Please note also on interrogation of her pacemaker she is up approaching EOL, and this will be further evaluated in our office,  She may need to have the battery exchange prior to going in VVI pacer mode to to her symptoms without the atrial kick.    Prior to discharge he ambulated in the hall was seen and evaluated by Dr. Rennis Golden.  She has refused surgery for her valves and she has refused dialysis.  She has had discussions with her prior cardiologist Dr. Royann Shivers, as well as Dr. Tresa Endo and previous cardiologist Dr. Lynnea Ferrier and Dr. Elsie Lincoln but end result of not having valve replacement surgery.  She is aware of this DOES NOT WANT to be  RESUSCITATED, due to her end-stage valvular heart disease.  Consults: None  Significant Diagnostic Studies:  At discharge sodium 137 potassium 4.3 chloride 98 CO2 27 BUN 52 creatinine 3.14 calcium 9.6 glucose 93  proBNP on admission 3130  At discharge 3400. Dr. Royann Shivers felt was her amount of valvular disease this is probably her new normal.  Hemoglobin 12.3 hematocrit 37.5 WBC 6.3 platelets 189 TSH 2.267 Portable chest x-ray on admission:  No active lung disease. Stable cardiomegaly with permanent  pacemaker  2-D echocardiogram: Left ventricle: The cavity size was at the upper limits of normal. Wall thickness was increased in a pattern of mild LVH. There was mild concentric hypertrophy. Systolic function was mildly reduced. The estimated ejection fraction was in the range of 40% to 50%. Probable moderate hypokinesis of the inferior myocardium. Doppler parameters are consistent with abnormal left ventricular relaxation (grade 1 diastolic dysfunction). Doppler parameters are consistent with both elevated ventricular end-diastolic filling pressure and elevated left atrial  filling pressure. - Aortic valve: Cusp separation was severely reduced. There was severe stenosis. Mild regurgitation. Valve area: 0.67cm^2(VTI). Valve area: 0.6cm^2 (Vmax). - Mitral valve: Severely calcified annulus. Moderate to severe regurgitation. Valve area by pressure half-time: 1.54cm^2. Valve area by continuity equation (using LVOT flow): 0.82cm^2. - Left atrium: The atrium was mildly to moderately dilated. - Right ventricle: The cavity size was mildly dilated. - Atrial septum: No defect or patent foramen ovale was identified.   Discharge Exam: Blood pressure 114/46, pulse 57, temperature 97.3 F (36.3 C), temperature source Oral, resp. rate 16, height 4\' 11"  (1.499 m), weight 63.322 kg (139 lb 9.6 oz), SpO2 96.00%.   General:alert and oriented  Heart:S1S2 RRR 3/6 systolic murmur  Lungs:cleat  Abd:+ BS soft non tender  Ext:no edema  Disposition: 01-Home or Self Care  Discharge Orders    Future Appointments: Provider: Department: Dept Phone: Center:   01/09/2012 12:30 PM [facility]  None     Medication List  As of 12/18/2011  8:28 AM   STOP taking these medications         ciprofloxacin 250 MG tablet      docusate sodium 100 MG capsule      ezetimibe-simvastatin 10-40 MG per tablet      flecainide 50 MG tablet      furosemide 40 MG tablet      telmisartan  40 MG tablet         TAKE these medications         acetaminophen 325 MG tablet   Commonly known as: TYLENOL   Take 2 tablets (650 mg total) by mouth every 4 (four) hours as needed.      amiodarone 400 MG tablet   Commonly known as: PACERONE   Take 1 tablet (400 mg total) by mouth 2 (two) times daily.      aspirin EC 81 MG tablet   Take 81 mg by mouth daily.      calcitRIOL 0.25 MCG capsule   Commonly known as: ROCALTROL   Take 0.25 mcg by mouth daily.      cephALEXin 250 MG capsule   Commonly known as: KEFLEX   Take 250 mg by mouth daily.       ezetimibe-simvastatin 10-20 MG per tablet   Commonly known as: VYTORIN   Take 1 tablet by mouth daily at 6 PM.      folic acid 800 MCG tablet   Commonly known as: FOLVITE   Take 800 mcg by mouth daily.      insulin aspart 100 UNIT/ML injection   Commonly known as: novoLOG   Inject 10 Units into the skin 3 (three) times daily before meals.      insulin glargine 100 UNIT/ML injection   Commonly known as: LANTUS   Inject 18 Units into the skin at bedtime.      potassium chloride 10 MEQ tablet   Commonly known as: K-DUR   Take 10 mEq by mouth daily.      VAGIFEM 10 MCG Tabs   Generic drug: Estradiol   Place 1 tablet vaginally once a week. Insert on Wednesdays.           Follow-up Information    Follow up with Thurmon Fair, MD. (the office will call with date and time )    Contact information:   [address] [place] [address] [phone]        Discharge instructions: Monitor glucose   Heart Healthy diabetic diet  You will need Lab drawn at [facility] on Monday.  We decreased your Vytorin, samples will be given to you  Monitor your weight.  SignedLeone Brand 12/18/2011, 8:28 AM

## 2011-12-27 ENCOUNTER — Other Ambulatory Visit: Payer: Self-pay | Admitting: Cardiovascular Disease

## 2011-12-28 ENCOUNTER — Encounter (HOSPITAL_COMMUNITY): Payer: Self-pay | Admitting: Pharmacy Technician

## 2012-01-09 ENCOUNTER — Encounter (HOSPITAL_COMMUNITY)
Admission: RE | Admit: 2012-01-09 | Discharge: 2012-01-09 | Disposition: A | Discharge status: Home / Self Care | Payer: Medicare Other | Source: Ambulatory Visit | Attending: Nephrology | Admitting: Nephrology

## 2012-01-09 DIAGNOSIS — D509 Iron deficiency anemia, unspecified: Secondary | ICD-10-CM | POA: Insufficient documentation

## 2012-01-09 LAB — POCT HEMOGLOBIN-HEMACUE: Hemoglobin: 12.9 g/dL (ref 12.0–15.0)

## 2012-01-09 MED ORDER — EPOETIN ALFA 20000 UNIT/ML IJ SOLN: 15000.0000 [IU] | INTRAMUSCULAR | Status: DC

## 2012-01-10 LAB — IRON AND TIBC: TIBC: 303 ug/dL (ref 250–470)

## 2012-01-10 LAB — FERRITIN: Ferritin: 68 ng/mL (ref 10–291)

## 2012-01-11 MED ORDER — SODIUM CHLORIDE 0.45 % IV SOLN
INTRAVENOUS | Status: DC
  Administered 2012-01-12: 08:00:00 via INTRAVENOUS

## 2012-01-11 MED ORDER — SODIUM CHLORIDE 0.9 % IR SOLN
80.0000 mg | Status: DC
  Filled 2012-01-11: qty 2

## 2012-01-11 MED ORDER — SODIUM CHLORIDE 0.9 % IJ SOLN: 3.0000 mL | INTRAMUSCULAR | Status: DC | PRN

## 2012-01-11 MED ORDER — VANCOMYCIN HCL IN DEXTROSE 1-5 GM/200ML-% IV SOLN
1000.0000 mg | INTRAVENOUS | Status: DC
  Filled 2012-01-11: qty 200

## 2012-01-11 MED ORDER — CHLORHEXIDINE GLUCONATE 4 % EX LIQD
60.0000 mL | Freq: Once | CUTANEOUS | Status: DC
  Filled 2012-01-11: qty 60

## 2012-01-12 ENCOUNTER — Ambulatory Visit (HOSPITAL_COMMUNITY)
Admission: RE | Admit: 2012-01-12 | Discharge: 2012-01-12 | Disposition: A | Discharge status: Home / Self Care | Payer: Medicare Other | Source: Ambulatory Visit | Attending: Cardiovascular Disease | Admitting: Cardiovascular Disease

## 2012-01-12 ENCOUNTER — Encounter (HOSPITAL_COMMUNITY)
Admission: RE | Disposition: A | Discharge status: Home / Self Care | Payer: Self-pay | Source: Ambulatory Visit | Attending: Cardiovascular Disease

## 2012-01-12 DIAGNOSIS — E119 Type 2 diabetes mellitus without complications: Secondary | ICD-10-CM | POA: Insufficient documentation

## 2012-01-12 DIAGNOSIS — N189 Chronic kidney disease, unspecified: Secondary | ICD-10-CM | POA: Insufficient documentation

## 2012-01-12 DIAGNOSIS — I359 Nonrheumatic aortic valve disorder, unspecified: Secondary | ICD-10-CM | POA: Insufficient documentation

## 2012-01-12 DIAGNOSIS — Z45018 Encounter for adjustment and management of other part of cardiac pacemaker: Secondary | ICD-10-CM | POA: Insufficient documentation

## 2012-01-12 DIAGNOSIS — I4891 Unspecified atrial fibrillation: Secondary | ICD-10-CM | POA: Insufficient documentation

## 2012-01-12 HISTORY — PX: PERMANENT PACEMAKER GENERATOR CHANGE: SHX6022

## 2012-01-12 LAB — SURGICAL PCR SCREEN: Staphylococcus aureus: NEGATIVE

## 2012-01-12 LAB — GLUCOSE, CAPILLARY: Glucose-Capillary: 136 mg/dL — ABNORMAL HIGH (ref 70–99)

## 2012-01-12 SURGERY — PERMANENT PACEMAKER GENERATOR CHANGE
Anesthesia: LOCAL

## 2012-01-12 MED ORDER — LIDOCAINE HCL (PF) 1 % IJ SOLN
INTRAMUSCULAR | Status: AC
  Filled 2012-01-12: qty 60

## 2012-01-12 MED ORDER — MUPIROCIN 2 % EX OINT: TOPICAL_OINTMENT | Freq: Two times a day (BID) | CUTANEOUS | Status: DC

## 2012-01-12 MED ORDER — MUPIROCIN 2 % EX OINT
TOPICAL_OINTMENT | CUTANEOUS | Status: AC
  Administered 2012-01-12: 1
  Filled 2012-01-12: qty 22

## 2012-01-12 MED ORDER — MIDAZOLAM HCL 2 MG/2ML IJ SOLN
INTRAMUSCULAR | Status: AC
  Filled 2012-01-12: qty 2

## 2012-01-12 MED ORDER — HEPARIN (PORCINE) IN NACL 2-0.9 UNIT/ML-% IJ SOLN
INTRAMUSCULAR | Status: AC
  Filled 2012-01-12: qty 1000

## 2012-01-12 MED ORDER — FENTANYL CITRATE 0.05 MG/ML IJ SOLN
INTRAMUSCULAR | Status: AC
  Filled 2012-01-12: qty 2

## 2012-01-12 MED ORDER — SODIUM CHLORIDE 0.9 % IV SOLN: INTRAVENOUS | Status: DC

## 2012-01-12 MED ORDER — ACETAMINOPHEN 325 MG PO TABS: 325.0000 mg | ORAL_TABLET | ORAL | Status: DC | PRN

## 2012-01-12 MED ORDER — ONDANSETRON HCL 4 MG/2ML IJ SOLN: 4.0000 mg | Freq: Four times a day (QID) | INTRAMUSCULAR | Status: DC | PRN

## 2012-01-12 NOTE — CV Procedure (Signed)
[facility] Female, 78 y.o., 1924-10-16  Location: [facility]ATH [facility]  Bed: NONE  MRN: [id number]  CSN: [id number]  Procedure report  Procedure performed:  1. Dual-chamber pacemaker generator changeout  2. Light sedation  Reason for procedure:  1. Device generator at elective replacement interval  2. Complete heart block Procedure performed by:  Thurmon Fair, MD  Complications:  None  Estimated blood loss:  <5 mL  Medications administered during procedure:  Vancomycin 1 g intravenously, lidocaine 1% 30 mL locally, fentanyl 25 mcg intravenously, Versed 2 mg intravenously Device details:   New Generator Medtronic adapta model number ADDR L1, serial number [id number] Right atrial lead (chronic) Medtronic [id number], serial numberLEV [id number] (implanted 04/12/2006) Right ventricular lead (chronic)  Medtronic [id number], serial number [id number] (implanted 04/12/2006)  Explanted generator Medtronic enrhythm,  model number P1501 DR, serial number  PNP [id number] (implanted 04/12/2006)  Procedure details:  After the risks and benefits of the procedure were discussed the patient provided informed consent. She was brought to the cardiac catheter lab in the fasting state. The patient was prepped and draped in usual sterile fashion. Local anesthesia with 1% lidocaine was administered to to the left infraclavicular area. A 5-6cm horizontal incision was made parallel with and 2-3 cm caudal to the left clavicle, in the area of an old scar. Using minimal electrocautery and mostly sharp and blunt dissection the prepectoral pocket was opened carefully to avoid injury to thet loops of chronic leads. Extensive dissection was not necessary. The device was explanted. The pocket was carefully inspected for hemostasis and flushed with copious amounts of antibiotic solution.  The leads were disconnected from the old generator and the ventricular lead was immediately connected to a new generator with appropriate sensing and  pacing note. Atrial lead testing showed a known problem with high capture thresholds. Unipolar pacing did not seem to improve this. Due to the patient's comorbidities the decision had already been made not to replace the atrial lead. The atrial lead was connected to the new generator, with appropriate atrioventricular sequential pacing noted.   The entire system was then carefully inserted in the pocket with care been taking that the leads and device assumed a comfortable position without pressure on the incision. Great care was taken that the leads be located deep to the generator. The pocket was then closed in layers using 2 layers of 2-0 Vicryl and cutaneous staples after which a sterile dressing was applied.   At the end of the procedure the following lead parameters were encountered:   Right atrial lead sensed P waves 1.3 mV, impedance 375 ohms, threshold 3.5 at 0.4 ms pulse width.  Right ventricular lead sensed R waves  Were not detected/pacemaker dependent,  impedance 521 ohms, threshold less than 0.75 V at 0.4 ms pulse width.  Thurmon Fair, MD, [facility] [phone] office [phone] pager 01/12/2012 11:08 AM

## 2012-01-12 NOTE — H&P (Signed)
Date of Initial H&P: 12/12/2011  History reviewed, patient examined, no change in status, stable for surgery.  78 year old with complete heart block /pacemaker dependent, now with device at elective replacement interval.  She also has critical AS, diabetes mellitus, paroxysmal AF and CKD, but has preserved LVEF  Although lead parameters show relatively high pacing thresholds, we plan to proceed with conservative therapy (generator changeout only) due to her numerous comorbidities and very guarded long term prognosis.  Thurmon Fair, MD, [facility] [phone] office [phone] pager 01/12/2012 9:02 AM

## 2012-01-23 ENCOUNTER — Encounter (HOSPITAL_COMMUNITY): Payer: Medicare Other

## 2012-02-02 ENCOUNTER — Other Ambulatory Visit (HOSPITAL_COMMUNITY): Payer: Self-pay | Admitting: *Deleted

## 2012-02-06 ENCOUNTER — Encounter (HOSPITAL_COMMUNITY)
Admission: RE | Admit: 2012-02-06 | Discharge: 2012-02-06 | Disposition: A | Discharge status: Home / Self Care | Payer: Medicare Other | Source: Ambulatory Visit | Attending: Nephrology | Admitting: Nephrology

## 2012-02-06 DIAGNOSIS — D509 Iron deficiency anemia, unspecified: Secondary | ICD-10-CM | POA: Insufficient documentation

## 2012-02-06 LAB — IRON AND TIBC
Iron: 68 ug/dL (ref 42–135)
Saturation Ratios: 22 % (ref 20–55)
TIBC: 304 ug/dL (ref 250–470)

## 2012-02-06 LAB — POCT HEMOGLOBIN-HEMACUE: Hemoglobin: 12.5 g/dL (ref 12.0–15.0)

## 2012-02-06 MED ORDER — EPOETIN ALFA 20000 UNIT/ML IJ SOLN: 15000.0000 [IU] | INTRAMUSCULAR | Status: DC

## 2012-03-01 ENCOUNTER — Other Ambulatory Visit (HOSPITAL_COMMUNITY): Payer: Self-pay | Admitting: *Deleted

## 2012-03-05 ENCOUNTER — Encounter (HOSPITAL_COMMUNITY)
Admission: RE | Admit: 2012-03-05 | Discharge: 2012-03-05 | Disposition: A | Discharge status: Home / Self Care | Payer: MEDICARE | Source: Ambulatory Visit | Attending: Nephrology | Admitting: Nephrology

## 2012-03-05 DIAGNOSIS — D509 Iron deficiency anemia, unspecified: Secondary | ICD-10-CM | POA: Insufficient documentation

## 2012-03-05 LAB — IRON AND TIBC
Iron: 72 ug/dL (ref 42–135)
TIBC: 350 ug/dL (ref 250–470)

## 2012-03-05 LAB — POCT HEMOGLOBIN-HEMACUE: Hemoglobin: 12.4 g/dL (ref 12.0–15.0)

## 2012-03-05 MED ORDER — EPOETIN ALFA 20000 UNIT/ML IJ SOLN: 15000.0000 [IU] | INTRAMUSCULAR | Status: DC

## 2012-03-17 ENCOUNTER — Emergency Department (HOSPITAL_COMMUNITY): Payer: Medicare Other

## 2012-03-17 ENCOUNTER — Encounter (HOSPITAL_COMMUNITY): Payer: Self-pay | Admitting: Emergency Medicine

## 2012-03-17 ENCOUNTER — Inpatient Hospital Stay (HOSPITAL_COMMUNITY)
Admission: EM | Admit: 2012-03-17 | Discharge: 2012-03-19 | DRG: 062 | Disposition: A | Discharge status: Rehabilitation Facility | Payer: Medicare Other | Attending: Neurology | Admitting: Neurology

## 2012-03-17 DIAGNOSIS — E119 Type 2 diabetes mellitus without complications: Secondary | ICD-10-CM | POA: Diagnosis present

## 2012-03-17 DIAGNOSIS — I639 Cerebral infarction, unspecified: Secondary | ICD-10-CM

## 2012-03-17 DIAGNOSIS — I6340 Cerebral infarction due to embolism of unspecified cerebral artery: Principal | ICD-10-CM | POA: Diagnosis present

## 2012-03-17 DIAGNOSIS — I5042 Chronic combined systolic (congestive) and diastolic (congestive) heart failure: Secondary | ICD-10-CM | POA: Diagnosis present

## 2012-03-17 DIAGNOSIS — Z950 Presence of cardiac pacemaker: Secondary | ICD-10-CM

## 2012-03-17 DIAGNOSIS — E785 Hyperlipidemia, unspecified: Secondary | ICD-10-CM | POA: Diagnosis present

## 2012-03-17 DIAGNOSIS — E669 Obesity, unspecified: Secondary | ICD-10-CM | POA: Diagnosis present

## 2012-03-17 DIAGNOSIS — G8190 Hemiplegia, unspecified affecting unspecified side: Secondary | ICD-10-CM | POA: Diagnosis present

## 2012-03-17 DIAGNOSIS — D631 Anemia in chronic kidney disease: Secondary | ICD-10-CM | POA: Diagnosis present

## 2012-03-17 DIAGNOSIS — I509 Heart failure, unspecified: Secondary | ICD-10-CM | POA: Diagnosis present

## 2012-03-17 DIAGNOSIS — I350 Nonrheumatic aortic (valve) stenosis: Secondary | ICD-10-CM

## 2012-03-17 DIAGNOSIS — I2510 Atherosclerotic heart disease of native coronary artery without angina pectoris: Secondary | ICD-10-CM | POA: Diagnosis present

## 2012-03-17 DIAGNOSIS — IMO0001 Reserved for inherently not codable concepts without codable children: Secondary | ICD-10-CM | POA: Diagnosis present

## 2012-03-17 DIAGNOSIS — I4891 Unspecified atrial fibrillation: Secondary | ICD-10-CM | POA: Diagnosis present

## 2012-03-17 DIAGNOSIS — I129 Hypertensive chronic kidney disease with stage 1 through stage 4 chronic kidney disease, or unspecified chronic kidney disease: Secondary | ICD-10-CM | POA: Diagnosis present

## 2012-03-17 DIAGNOSIS — N185 Chronic kidney disease, stage 5: Secondary | ICD-10-CM | POA: Diagnosis present

## 2012-03-17 DIAGNOSIS — I080 Rheumatic disorders of both mitral and aortic valves: Secondary | ICD-10-CM | POA: Diagnosis present

## 2012-03-17 DIAGNOSIS — Z87891 Personal history of nicotine dependence: Secondary | ICD-10-CM

## 2012-03-17 DIAGNOSIS — N390 Urinary tract infection, site not specified: Secondary | ICD-10-CM | POA: Diagnosis present

## 2012-03-17 DIAGNOSIS — I480 Paroxysmal atrial fibrillation: Secondary | ICD-10-CM | POA: Diagnosis present

## 2012-03-17 DIAGNOSIS — N184 Chronic kidney disease, stage 4 (severe): Secondary | ICD-10-CM | POA: Diagnosis present

## 2012-03-17 HISTORY — DX: Other specified disorders of eye and adnexa: H57.89

## 2012-03-17 LAB — MRSA PCR SCREENING: MRSA by PCR: NEGATIVE

## 2012-03-17 LAB — TROPONIN I: Troponin I: <0.3 ng/mL (ref <0.30)

## 2012-03-17 LAB — DIFFERENTIAL
Basophils Relative: 0 % (ref 0–1)
Lymphocytes Relative: 25 % (ref 12–46)
Monocytes Relative: 8 % (ref 3–12)
Neutro Abs: 4.2 10*3/uL (ref 1.7–7.7)
Neutrophils Relative %: 62 % (ref 43–77)

## 2012-03-17 LAB — POCT I-STAT, CHEM 8
BUN: 55 mg/dL — ABNORMAL HIGH (ref 6–23)
Chloride: 101 mEq/L (ref 96–112)
Creatinine, Ser: 2.3 mg/dL — ABNORMAL HIGH (ref 0.50–1.10)
Glucose, Bld: 228 mg/dL — ABNORMAL HIGH (ref 70–99)
Hemoglobin: 10.5 g/dL — ABNORMAL LOW (ref 12.0–15.0)
Potassium: 4.2 mEq/L (ref 3.5–5.1)
Sodium: 137 mEq/L (ref 135–145)

## 2012-03-17 LAB — COMPREHENSIVE METABOLIC PANEL
AST: 20 U/L (ref 0–37)
Albumin: 3.5 g/dL (ref 3.5–5.2)
Alkaline Phosphatase: 98 U/L (ref 39–117)
BUN: 55 mg/dL — ABNORMAL HIGH (ref 6–23)
CO2: 25 mEq/L (ref 19–32)
Chloride: 97 mEq/L (ref 96–112)
GFR calc non Af Amer: 18 mL/min — ABNORMAL LOW (ref >90)
Potassium: 4.1 mEq/L (ref 3.5–5.1)
Total Bilirubin: 0.3 mg/dL (ref 0.3–1.2)

## 2012-03-17 LAB — CBC
HCT: 32.3 % — ABNORMAL LOW (ref 36.0–46.0)
Hemoglobin: 10.1 g/dL — ABNORMAL LOW (ref 12.0–15.0)
MCHC: 31.3 g/dL (ref 30.0–36.0)
RBC: 3.34 MIL/uL — ABNORMAL LOW (ref 3.87–5.11)
WBC: 6.7 10*3/uL (ref 4.0–10.5)

## 2012-03-17 LAB — APTT: aPTT: 31 seconds (ref 24–37)

## 2012-03-17 LAB — PROTIME-INR: INR: 0.96 (ref 0.00–1.49)

## 2012-03-17 MED ORDER — SODIUM CHLORIDE 0.9 % IV SOLN
INTRAVENOUS | Status: DC
  Administered 2012-03-17: 19:00:00 via INTRAVENOUS

## 2012-03-17 MED ORDER — PANTOPRAZOLE SODIUM 40 MG IV SOLR
40.0000 mg | Freq: Every day | INTRAVENOUS | Status: DC
  Administered 2012-03-17: 40 mg via INTRAVENOUS
  Filled 2012-03-17 (×2): qty 40

## 2012-03-17 MED ORDER — ALTEPLASE (STROKE) FULL DOSE INFUSION
0.9000 mg/kg | Freq: Once | INTRAVENOUS | Status: AC
  Administered 2012-03-17: 59 mg via INTRAVENOUS
  Filled 2012-03-17: qty 59

## 2012-03-17 MED ORDER — SODIUM CHLORIDE 0.9 % IV SOLN: 250.0000 mL | Freq: Once | INTRAVENOUS | Status: DC

## 2012-03-17 MED ORDER — LABETALOL HCL 5 MG/ML IV SOLN: 10.0000 mg | INTRAVENOUS | Status: DC | PRN

## 2012-03-17 MED ORDER — SENNOSIDES-DOCUSATE SODIUM 8.6-50 MG PO TABS
1.0000 | ORAL_TABLET | Freq: Every evening | ORAL | Status: DC | PRN
  Filled 2012-03-17: qty 1

## 2012-03-17 MED ORDER — TRAMADOL HCL 50 MG PO TABS
50.0000 mg | ORAL_TABLET | Freq: Two times a day (BID) | ORAL | Status: DC | PRN
Start: 2012-03-17 — End: 2012-03-19
  Administered 2012-03-17: 50 mg via ORAL
  Filled 2012-03-17 (×2): qty 1

## 2012-03-17 MED ORDER — ONDANSETRON HCL 4 MG/2ML IJ SOLN: 4.0000 mg | Freq: Four times a day (QID) | INTRAMUSCULAR | Status: DC | PRN

## 2012-03-17 MED ORDER — LIDOCAINE 5 % EX PTCH
1.0000 | MEDICATED_PATCH | CUTANEOUS | Status: DC
  Administered 2012-03-17: 1 via TRANSDERMAL
  Filled 2012-03-17 (×4): qty 1

## 2012-03-17 NOTE — ED Notes (Signed)
Neurologist, Dr. Thad Ranger is at the bedside performing complete Neuro exam

## 2012-03-17 NOTE — H&P (Signed)
Admission H&P    Chief Complaint: Right arm weakness HPI: [name] is an 78 y.o. female who was getting dressed today.  She acutely became dizzy and when she went to grab the bedrail she noted that her right arm was weak.  Patient was brought in by private vehicle.  Code stroke was called.  Initial CT was unremarkable.    LSN: [id number] tPA Given: Yes  Past Medical History  Diagnosis Date  . Diabetes mellitus, insulin dependent (IDDM), controlled   . Chronic kidney disease, stage 4, severely decreased GFR     likely related to diabetic and hypertensive nephropathy, no evidence of renal artery stenosis by duplex US in 2008  . Mitral valve disorder   . Pacemaker November 2007    s/p placement complete heart block status  . Near syncope 12/12/2011  . Aortic stenosis, moderate to severe 12/12/2011  . Chronic degenerative aortic valve disease 12/12/2011  . PAF (paroxysmal atrial fibrillation) 12/12/2011  . H/O hemolytic anemia due to drugs 12/12/2011  . SSS (sick sinus syndrome) with medtronic pacemaker, placed 6 years ago, with micro dislodgement of atrial lead, though still paces--for CHB and paf 12/12/2011  . UTI (lower urinary tract infection), curent and history of UTIs on Keflex daily 12/12/2011  . CAD (coronary artery disease), cardiac cath  12/12/2011  . Anemia, secondary to chronic disease 12/12/2011  . Pacemaker complications     Known chronic dysfunction of atrial lead with high pacing trhesholds but good sensing.  . Chronic combined systolic and diastolic CHF, NYHA class 2, in combination with AS, MR 12/15/2011  . Bleeding of eye retinal eye bleed    Past Surgical History  Procedure Date  . Pacemaker insertion November 2007    Medtronic EnRhythm dual-chamber [id number]  . Cardiac catheterization 2005    60% stenosis of 1st diagnoal branch, 60-70% stenosis of posterolateral brach of distal RCA, preserved LV with EF 50-55%, diastolic dysfunction  . Eye surgery retinal eye bleed  . Abdominal  hysterectomy     No family history on file. Social History:  reports that she quit smoking about 31 years ago. Her smoking use included Cigarettes. She has a 30 pack-year smoking history. She does not have any smokeless tobacco history on file. She reports that she does not drink alcohol or use illicit drugs.  Allergies:  Allergies  Allergen Reactions  . Floxin (Ofloxacin) Other (See Comments)    nightmares  . Sulfa Antibiotics     From when younger  . Darvocet (Propoxyphene-Acetaminophen) Rash  . Penicillins Rash   Medications: Current outpatient prescriptions:amiodarone (PACERONE) 200 MG tablet, Take 200 mg by mouth 2 (two) times daily., Disp: , Rfl: ;  aspirin EC 81 MG tablet, Take 81 mg by mouth daily., Disp: , Rfl: ;  calcitRIOL (ROCALTROL) 0.25 MCG capsule, Take 0.25 mcg by mouth every other day. , Disp: , Rfl: ;  cephALEXin (KEFLEX) 250 MG capsule, Take 250 mg by mouth daily., Disp: , Rfl:  Estradiol (VAGIFEM) 10 MCG TABS, Place 1 tablet vaginally once a week. Insert on Wednesdays., Disp: , Rfl: ;  ezetimibe-simvastatin (VYTORIN) 10-10 MG per tablet, Take 1 tablet by mouth daily., Disp: , Rfl: ;  furosemide (LASIX) 40 MG tablet, Take 40 mg by mouth every morning., Disp: , Rfl: ;  insulin aspart (NOVOLOG) 100 UNIT/ML injection, Inject 8 Units into the skin 3 (three) times daily before meals. Per sliding scale., Disp: , Rfl:  insulin glargine (LANTUS) 100 UNIT/ML injection, Inject 18 Units into  the skin every morning. , Disp: , Rfl: ;  Multiple Vitamin (MULTIVITAMIN WITH MINERALS) TABS, Take 1 tablet by mouth daily., Disp: , Rfl: ;  potassium chloride (K-DUR) 10 MEQ tablet, Take 10 mEq by mouth daily., Disp: , Rfl: ;  telmisartan (MICARDIS) 40 MG tablet, Take 40 mg by mouth daily., Disp: , Rfl:  zolpidem (AMBIEN) 5 MG tablet, Take 2.5 mg by mouth at bedtime., Disp: , Rfl:     ROS: History obtained from the patient  General ROS: negative for - chills, fatigue, fever, night sweats,  weight gain or weight loss Psychological ROS: negative for - behavioral disorder, hallucinations, memory difficulties, mood swings or suicidal ideation Ophthalmic ROS: negative for - blurry vision, double vision, eye pain or loss of vision ENT ROS: negative for - epistaxis, nasal discharge, oral lesions, sore throat, tinnitus or vertigo Allergy and Immunology ROS: negative for - hives or itchy/watery eyes Hematological and Lymphatic ROS: negative for - bleeding problems, bruising or swollen lymph nodes Endocrine ROS: negative for - galactorrhea, hair pattern changes, polydipsia/polyuria or temperature intolerance Respiratory ROS: negative for - cough, hemoptysis, shortness of breath or wheezing Cardiovascular ROS: negative for - chest pain, dyspnea on exertion, edema or irregular heartbeat Gastrointestinal ROS: negative for - abdominal pain, diarrhea, hematemesis, nausea/vomiting or stool incontinence Genito-Urinary ROS: negative for - dysuria, hematuria, incontinence or urinary frequency/urgency Musculoskeletal ROS: negative for - joint swelling or muscular weakness Neurological ROS: as noted in HPI Dermatological ROS: negative for rash and skin lesion changes  Physical Examination: Blood pressure 156/63, pulse 61, temperature 98.6 F (37 C), temperature source Oral, resp. rate 15, weight 65 kg (143 lb 4.8 oz), SpO2 99.00%.  General Examination: HEENT-  Normocephalic, no lesions, without obvious abnormality.  Normal external eye and conjunctiva.  Normal TM's bilaterally.  Normal auditory canals and external ears. Normal external nose, mucus membranes and septum.  Normal pharynx. Neck supple with no masses, nodes, nodules or enlargement. Cardiovascular - S1, S2 normal Lungs - chest clear, no wheezing, rales, normal symmetric air entry Abdomen - soft, non-tender; bowel sounds normal; no masses,  no organomegaly Extremities - no edema  Neurologic Examination: Mental Status: Alert,  oriented, thought content appropriate.  Speech fluent without evidence of aphasia.  Able to follow 3 step commands without difficulty. Cranial Nerves: II: Discs flat bilaterally; Visual fields grossly normal although vision poor, pupils equal, round, reactive to light and accommodation III,IV, VI: ptosis not present, extra-ocular motions intact bilaterally V,VII: smile symmetric, facial light touch sensation normal bilaterally VIII: hearing normal bilaterally IX,X: gag reflex present XI: bilateral shoulder shrug XII: midline tongue extension Motor: Right : Upper extremity   3-/5    Left:     Upper extremity   5/5  Lower extremity   5/5     Lower extremity   5/5 Tone and bulk:normal tone throughout; no atrophy noted Sensory: Pinprick and light touch intact throughout, bilaterally Deep Tendon Reflexes: 2+ and symmetric with absent AJ's bilaterally  Plantars: Right: mute   Left: mute Cerebellar: Dysmetria with finger to nose using the RUE.  Otherwise finger to nose and heel to shin intact.   CV: pulses palpable throughout     Laboratory Studies:   Basic Metabolic Panel:  Lab 03/17/12 1610 03/17/12 1240  NA 137 135  K 4.2 4.1  CL 101 97  CO2 -- 25  GLUCOSE 228* 240*  BUN 55* 55*  CREATININE 2.30* 2.37*  CALCIUM -- 10.2  MG -- --  PHOS -- --  Liver Function Tests:  Lab 03/17/12 1240  AST 20  ALT 13  ALKPHOS 98  BILITOT 0.3  PROT 6.8  ALBUMIN 3.5   No results found for this basename: LIPASE:5,AMYLASE:5 in the last 168 hours No results found for this basename: AMMONIA:3 in the last 168 hours  CBC:  Lab 03/17/12 1243 03/17/12 1240  WBC -- 6.7  NEUTROABS -- 4.2  HGB 10.5* 10.1*  HCT 31.0* 32.3*  MCV -- 96.7  PLT -- 247    Cardiac Enzymes:  Lab 03/17/12 1240  CKTOTAL --  CKMB --  CKMBINDEX --  TROPONINI <0.30    BNP: No components found with this basename: POCBNP:5  CBG:  Lab 03/17/12 1228  GLUCAP 226*    Microbiology: Results for orders  placed during the hospital encounter of 01/12/12  SURGICAL PCR SCREEN     Status: Normal   Collection Time   01/12/12  7:32 AM      Component Value Range Status Comment   MRSA, PCR NEGATIVE  NEGATIVE Final    Staphylococcus aureus NEGATIVE  NEGATIVE Final     Coagulation Studies:  Basename 03/17/12 1240  LABPROT 12.7  INR 0.96    Urinalysis: No results found for this basename: COLORURINE:2,APPERANCEUR:2,LABSPEC:2,PHURINE:2,GLUCOSEU:2,HGBUR:2,BILIRUBINUR:2,KETONESUR:2,PROTEINUR:2,UROBILINOGEN:2,NITRITE:2,LEUKOCYTESUR:2 in the last 168 hours  Lipid Panel:  No results found for this basename: chol, trig, hdl, cholhdl, vldl, ldlcalc    HgbA1C:  Lab Results  Component Value Date   HGBA1C 6.2* 12/13/2011    Urine Drug Screen:   No results found for this basename: labopia, cocainscrnur, labbenz, amphetmu, thcu, labbarb    Alcohol Level: No results found for this basename: ETH:2 in the last 168 hours   Imaging: Ct Head Wo Contrast  03/17/2012  *RADIOLOGY REPORT*  Clinical Data: Code stroke. Acute onset right arm weakness.  CT HEAD WITHOUT CONTRAST  Technique:  Contiguous axial images were obtained from the base of the skull through the vertex without contrast.  Comparison: None.  Findings: There is no evidence of intracranial hemorrhage, brain edema or other signs of acute infarction.  There is no evidence of intracranial mass lesion or mass effect.  No abnormal extra-axial fluid collections are identified.  Moderate diffuse cerebral atrophy is seen.  Extensive chronic white matter disease is also demonstrated.  Old right caudate lacunar infarct is noted.  No evidence of hydrocephalus.  No skull fracture or other bone lesions identified.  IMPRESSION:  1.  No acute intracranial abnormality. 2.  Diffuse cerebral atrophy and extensive chronic small vessel disease.  Old right basal ganglia lacunar infarct.  These results were called by telephone on 03/17/2012 at 1245 hours to Dr. Radford Pax in the  emergency department, who verbally acknowledged these results.   Original Report Authenticated By: Danae Orleans, M.D.     Assessment: 78 y.o. female presenting with RUE weakness and dysmetria.  No further complaints of dizziness.  Ct shows calcification of vessels but no evidence of hemorrhage.  Due to the fact  That the right arm was affected so significantly it was determined that the patient would recieve tPA.  The risks and benefits were discussed with both the patient and the daughter.  Verbal consent was obtained and tPA was administered.    Stroke Risk Factors - atrial fibrillation and diabetes mellitus  Plan: 1. HgbA1c, fasting lipid panel 2. MRI, MRA  of the brain without contrast 3. PT consult, OT consult, Speech consult 4. Echocardiogram 5. Carotid dopplers 6. Prophylactic therapy-None 7. Risk factor modification 8. Telemetry monitoring  9. Frequent neuro checks 10.  Follow up imaging in 24 hours.  This patient is critically ill and at significant risk of neurological worsening, death and care requires constant monitoring of vital signs, hemodynamics,respiratory and cardiac monitoring, neurological assessment, discussion with family, other specialists and medical decision making of high complexity. I spent 60 minutes of neurocritical care time  in the care of  this patient.  Thana Farr, MD [facility] [phone] 03/17/2012  3:02 PM   Thana Farr, MD [facility] [phone] 03/17/2012, 2:50 PM

## 2012-03-17 NOTE — ED Notes (Addendum)
Cardiologist is at the bedside 

## 2012-03-17 NOTE — ED Notes (Signed)
Code stroke and lsn at 1135.  CBG 226.  Right sided arm deficit.  Does follow commands

## 2012-03-17 NOTE — ED Provider Notes (Signed)
History     CSN: [id number]  Arrival date & time 03/17/12  1226   First MD Initiated Contact with Patient 03/17/12 1232      Chief Complaint  Patient presents with  . Code stroke      HPI Leighton Ruff the daughter patient was at home and developed sudden right arm weakness and some leg weakness.  No speech abnormalities.  Patient has previous history of pacemaker along with aortic stenosis and paroxysmal atrial fibrillation.  Patient is currently not on Coumadin or any other anticoagulant other than aspirin.  Patient has had no recent surgeries.  She did have a pacemaker generator replaced approximately 2 months ago.   Past Medical History  Diagnosis Date  . Diabetes mellitus, insulin dependent (IDDM), controlled   . Chronic kidney disease, stage 4, severely decreased GFR     likely related to diabetic and hypertensive nephropathy, no evidence of renal artery stenosis by duplex US in 2008  . Mitral valve disorder   . Pacemaker November 2007    s/p placement complete heart block status  . Near syncope 12/12/2011  . Aortic stenosis, moderate to severe 12/12/2011  . Chronic degenerative aortic valve disease 12/12/2011  . PAF (paroxysmal atrial fibrillation) 12/12/2011  . H/O hemolytic anemia due to drugs 12/12/2011  . SSS (sick sinus syndrome) with medtronic pacemaker, placed 6 years ago, with micro dislodgement of atrial lead, though still paces--for CHB and paf 12/12/2011  . UTI (lower urinary tract infection), curent and history of UTIs on Keflex daily 12/12/2011  . CAD (coronary artery disease), cardiac cath  12/12/2011  . Anemia, secondary to chronic disease 12/12/2011  . Pacemaker complications     Known chronic dysfunction of atrial lead with high pacing trhesholds but good sensing.  . Chronic combined systolic and diastolic CHF, NYHA class 2, in combination with AS, MR 12/15/2011  . Bleeding of eye retinal eye bleed    Past Surgical History  Procedure Date  . Pacemaker insertion November  2007    Medtronic EnRhythm dual-chamber [id number]  . Cardiac catheterization 2005    60% stenosis of 1st diagnoal branch, 60-70% stenosis of posterolateral brach of distal RCA, preserved LV with EF 50-55%, diastolic dysfunction  . Eye surgery retinal eye bleed  . Abdominal hysterectomy     No family history on file.  History  Substance Use Topics  . Smoking status: Former Smoker -- 1.0 packs/day for 30 years    Types: Cigarettes    Quit date: 06/05/1980  . Smokeless tobacco: Not on file  . Alcohol Use: No    OB History    Grav Para Term Preterm Abortions TAB SAB Ect Mult Living                  Review of Systems  Allergies  Floxin; Sulfa antibiotics; Darvocet; and Penicillins  Home Medications   Current Outpatient Rx  Name Route Sig Dispense Refill  . ACETAMINOPHEN 500 MG PO TABS Oral Take 1,000 mg by mouth 3 (three) times daily as needed. For pain    . AMIODARONE HCL 200 MG PO TABS Oral Take 200 mg by mouth daily.     . ASPIRIN EC 81 MG PO TBEC Oral Take 81 mg by mouth daily.    Marland Kitchen CALCITRIOL 0.25 MCG PO CAPS Oral Take 0.25 mcg by mouth every other day.     . CEPHALEXIN 250 MG PO CAPS Oral Take 250 mg by mouth daily.    Marland Kitchen DOCUSATE SODIUM 100 MG  PO CAPS Oral Take 200 mg by mouth daily.    Marland Kitchen ESTRADIOL 10 MCG VA TABS Vaginal Place 1 tablet vaginally once a week. Insert on Wednesdays.    Marland Kitchen EZETIMIBE-SIMVASTATIN 10-10 MG PO TABS Oral Take 1 tablet by mouth daily.    . FUROSEMIDE 40 MG PO TABS Oral Take 40 mg by mouth every morning.    . INSULIN ASPART 100 UNIT/ML [place] SOLN Subcutaneous Inject 8 Units into the skin 3 (three) times daily before meals. Per sliding scale.    . INSULIN GLARGINE 100 UNIT/ML [place] SOLN Subcutaneous Inject 16 Units into the skin every morning.     Marland Kitchen POTASSIUM CHLORIDE ER 10 MEQ PO TBCR Oral Take 10 mEq by mouth daily.    Marland Kitchen ALIGN 4 MG PO CAPS Oral Take 1 capsule by mouth daily.    Marland Kitchen ZOLPIDEM TARTRATE 5 MG PO TABS Oral Take 2.5 mg by mouth at bedtime.  Patient takes another one-half tablet in the middle of night if she wakes up.      BP 150/64  Pulse 60  Temp 98.5 F (36.9 C) (Oral)  Resp 13  Wt 143 lb 4.8 oz (65 kg)  SpO2 100%  Physical Exam Physical Examination:  Blood pressure 156/63, pulse 61, temperature 98.6 F (37 C), temperature source Oral, resp. rate 15, weight 65 kg (143 lb 4.8 oz), SpO2 99.00%.  General Examination:  HEENT- Normocephalic, no lesions, without obvious abnormality. Normal external eye and conjunctiva. Normal TM's bilaterally. Normal auditory canals and external ears. Normal external nose, mucus membranes and septum. Normal pharynx.  Neck supple with no masses, nodes, nodules or enlargement.  Cardiovascular - S1, S2 normal  Lungs - chest clear, no wheezing, rales, normal symmetric air entry  Abdomen - soft, non-tender; bowel sounds normal; no masses, no organomegaly  Extremities - no edema  Neurologic Examination:  Mental Status:  Alert, oriented, thought content appropriate. Speech fluent without evidence of aphasia. Able to follow 3 step commands without difficulty.  Cranial Nerves:  II: Discs flat bilaterally; Visual fields grossly normal although vision poor, pupils equal, round, reactive to light and accommodation  III,IV, VI: ptosis not present, extra-ocular motions intact bilaterally  V,VII: smile symmetric, facial light touch sensation normal bilaterally  VIII: hearing normal bilaterally  IX,X: gag reflex present  XI: bilateral shoulder shrug  XII: midline tongue extension  Motor:  Right : Upper extremity 3-/5 Left: Upper extremity 5/5  Lower extremity 4/5 Lower extremity 5/5  Tone and bulk:normal tone throughout; no atrophy noted  Sensory: Pinprick and light touch intact throughout, bilaterally  Deep Tendon Reflexes: 2+ and symmetric with absent AJ's bilaterally  Plantars:  Right: mute Left: mute  Cerebellar:  Dysmetria with finger to nose using the RUE. Otherwise finger to nose and heel  to shin intact.  CV: pulses palpable throughout   ED Course  Procedures (including critical care time)  SINUS TACHYCARDIA = 85 VENTRICULAR BIGEMINY ~ bigeminy string>4 w/ V complexes FIRST DEGREE AV BLOCK ~ PR >200, V-rate 121-300 CONSIDER RIGHT ATRIAL ABNORMALITY ~ P >0.28mV limb lead LEFT BUNDLE BRANCH BLOCK ~ QRSd>120, broad/notched R INFERIOR INFARCT, ACUTE ~ Q>34mS, ST>0.81mV, II III aVF Standard 12 Lead Report ~ Unconfirmed Interpretation Abnormal ECG   CRITICAL CARE Performed by: Nelva Nay L   Total critical care time: 30 minutes  Critical care time was exclusive of separately billable procedures and treating other patients.  Critical care was necessary to treat or prevent imminent or life-threatening deterioration.  Critical care was  time spent personally by me on the following activities: development of treatment plan with patient and/or surrogate as well as nursing, discussions with consultants, evaluation of patient's response to treatment, examination of patient, obtaining history from patient or surrogate, ordering and performing treatments and interventions, ordering and review of laboratory studies, ordering and review of radiographic studies, pulse oximetry and re-evaluation of patient's condition.    Labs Reviewed  GLUCOSE, CAPILLARY - Abnormal; Notable for the following:    Glucose-Capillary 226 (*)     All other components within normal limits  CBC - Abnormal; Notable for the following:    RBC 3.34 (*)     Hemoglobin 10.1 (*)     HCT 32.3 (*)     All other components within normal limits  COMPREHENSIVE METABOLIC PANEL - Abnormal; Notable for the following:    Glucose, Bld 240 (*)     BUN 55 (*)     Creatinine, Ser 2.37 (*)     GFR calc non Af Amer 18 (*)     GFR calc Af Amer 20 (*)     All other components within normal limits  POCT I-STAT, CHEM 8 - Abnormal; Notable for the following:    BUN 55 (*)     Creatinine, Ser 2.30 (*)     Glucose, Bld  228 (*)     Hemoglobin 10.5 (*)     HCT 31.0 (*)     All other components within normal limits  PROTIME-INR  APTT  DIFFERENTIAL  TROPONIN I  POCT I-STAT TROPONIN I   Ct Head Wo Contrast  03/17/2012  *RADIOLOGY REPORT*  Clinical Data: Code stroke. Acute onset right arm weakness.  CT HEAD WITHOUT CONTRAST  Technique:  Contiguous axial images were obtained from the base of the skull through the vertex without contrast.  Comparison: None.  Findings: There is no evidence of intracranial hemorrhage, brain edema or other signs of acute infarction.  There is no evidence of intracranial mass lesion or mass effect.  No abnormal extra-axial fluid collections are identified.  Moderate diffuse cerebral atrophy is seen.  Extensive chronic white matter disease is also demonstrated.  Old right caudate lacunar infarct is noted.  No evidence of hydrocephalus.  No skull fracture or other bone lesions identified.  IMPRESSION:  1.  No acute intracranial abnormality. 2.  Diffuse cerebral atrophy and extensive chronic small vessel disease.  Old right basal ganglia lacunar infarct.  These results were called by telephone on 03/17/2012 at 1245 hours to Dr. Radford Pax in the emergency department, who verbally acknowledged these results.   Original Report Authenticated By: Danae Orleans, M.D.      1. CVA (cerebral vascular accident)   2. Cerebral embolism with cerebral infarction   3. Hemiplegia, unspecified, affecting dominant side       MDM  Patient was declared a code stroke.  Neurology came to emergency department.  Evaluated patient and determine she was eligible for thrombolytics.  Thrombolytics administered as per neurology.        Nelia Shi, MD 03/17/12 [phone]

## 2012-03-17 NOTE — Code Documentation (Signed)
Code stroke called at 1229, patient arrived to [facility] via private vehicle at 1226, EDP seen at 1244, Stroke team at 1230, ct scan at 1232, lab arrived at 1233, CT read at 27, pharmacy notified to mix TPA at 1301, TPA delivered to bedside at 1318, TPa started at 43, 3100 ICU bed requested at 1331.  LSN at 1135 as per patient she was getting dressed and got dizzy went to grab the bed rail and could not use her arm.  NIHSS 2

## 2012-03-17 NOTE — ED Notes (Signed)
No foley inserted per Dr Thad Ranger ok.  Patient has hx of uti, she remains alert and oriented at this time

## 2012-03-18 ENCOUNTER — Inpatient Hospital Stay (HOSPITAL_COMMUNITY): Payer: Medicare Other

## 2012-03-18 DIAGNOSIS — E785 Hyperlipidemia, unspecified: Secondary | ICD-10-CM | POA: Diagnosis present

## 2012-03-18 DIAGNOSIS — I2510 Atherosclerotic heart disease of native coronary artery without angina pectoris: Secondary | ICD-10-CM

## 2012-03-18 DIAGNOSIS — I359 Nonrheumatic aortic valve disorder, unspecified: Secondary | ICD-10-CM

## 2012-03-18 DIAGNOSIS — I6350 Cerebral infarction due to unspecified occlusion or stenosis of unspecified cerebral artery: Secondary | ICD-10-CM

## 2012-03-18 DIAGNOSIS — I6340 Cerebral infarction due to embolism of unspecified cerebral artery: Principal | ICD-10-CM

## 2012-03-18 LAB — HEMOGLOBIN A1C: Hgb A1c MFr Bld: 6.1 % — ABNORMAL HIGH (ref <5.7)

## 2012-03-18 LAB — LIPID PANEL: LDL Cholesterol: 66 mg/dL (ref 0–99)

## 2012-03-18 MED ORDER — COUMADIN BOOK
1.0000 | Freq: Once | Status: DC
  Administered 2012-03-18: 1
  Filled 2012-03-18: qty 1

## 2012-03-18 MED ORDER — WARFARIN - PHARMACIST DOSING INPATIENT: Freq: Every day | Status: DC

## 2012-03-18 MED ORDER — DOCUSATE SODIUM 100 MG PO CAPS
200.0000 mg | ORAL_CAPSULE | Freq: Every day | ORAL | Status: DC
  Administered 2012-03-18 – 2012-03-19 (×2): 200 mg via ORAL
  Filled 2012-03-18 (×2): qty 2

## 2012-03-18 MED ORDER — EZETIMIBE 10 MG PO TABS
10.0000 mg | ORAL_TABLET | Freq: Every day | ORAL | Status: DC
  Administered 2012-03-18: 10 mg via ORAL
  Filled 2012-03-18 (×2): qty 1

## 2012-03-18 MED ORDER — FUROSEMIDE 40 MG PO TABS
40.0000 mg | ORAL_TABLET | Freq: Every morning | ORAL | Status: DC
  Administered 2012-03-19: 40 mg via ORAL
  Filled 2012-03-18: qty 1

## 2012-03-18 MED ORDER — SIMVASTATIN 10 MG PO TABS
10.0000 mg | ORAL_TABLET | Freq: Every day | ORAL | Status: DC
  Administered 2012-03-18: 10 mg via ORAL
  Filled 2012-03-18 (×2): qty 1

## 2012-03-18 MED ORDER — POTASSIUM CHLORIDE ER 10 MEQ PO TBCR
10.0000 meq | EXTENDED_RELEASE_TABLET | Freq: Every day | ORAL | Status: DC
  Administered 2012-03-18 – 2012-03-19 (×2): 10 meq via ORAL
  Filled 2012-03-18 (×2): qty 1

## 2012-03-18 MED ORDER — CEPHALEXIN 250 MG PO CAPS
250.0000 mg | ORAL_CAPSULE | Freq: Every day | ORAL | Status: DC
  Administered 2012-03-18 – 2012-03-19 (×2): 250 mg via ORAL
  Filled 2012-03-18 (×2): qty 1

## 2012-03-18 MED ORDER — INFLUENZA VIRUS VACC SPLIT PF IM SUSP
0.5000 mL | INTRAMUSCULAR | Status: AC
  Administered 2012-03-19: 0.5 mL via INTRAMUSCULAR
  Filled 2012-03-18: qty 0.5

## 2012-03-18 MED ORDER — ZOLPIDEM TARTRATE 5 MG PO TABS
2.5000 mg | ORAL_TABLET | Freq: Every day | ORAL | Status: DC
  Administered 2012-03-18: 2.5 mg via ORAL
  Filled 2012-03-18: qty 1

## 2012-03-18 MED ORDER — CALCITRIOL 0.25 MCG PO CAPS
0.2500 ug | ORAL_CAPSULE | ORAL | Status: DC
  Administered 2012-03-18: 0.25 ug via ORAL
  Filled 2012-03-18: qty 1

## 2012-03-18 MED ORDER — INSULIN GLARGINE 100 UNIT/ML ~~LOC~~ SOLN
16.0000 [IU] | Freq: Every morning | SUBCUTANEOUS | Status: DC
  Administered 2012-03-19: 16 [IU] via SUBCUTANEOUS

## 2012-03-18 MED ORDER — ASPIRIN EC 81 MG PO TBEC
81.0000 mg | DELAYED_RELEASE_TABLET | Freq: Every day | ORAL | Status: DC
  Administered 2012-03-18 – 2012-03-19 (×2): 81 mg via ORAL
  Filled 2012-03-18 (×2): qty 1

## 2012-03-18 MED ORDER — AMIODARONE HCL 200 MG PO TABS
200.0000 mg | ORAL_TABLET | Freq: Every day | ORAL | Status: DC
  Administered 2012-03-18 – 2012-03-19 (×2): 200 mg via ORAL
  Filled 2012-03-18 (×2): qty 1

## 2012-03-18 MED ORDER — INSULIN ASPART 100 UNIT/ML ~~LOC~~ SOLN
8.0000 [IU] | Freq: Three times a day (TID) | SUBCUTANEOUS | Status: DC
  Administered 2012-03-19 (×2): 8 [IU] via SUBCUTANEOUS

## 2012-03-18 MED ORDER — WARFARIN SODIUM 2.5 MG PO TABS
2.5000 mg | ORAL_TABLET | Freq: Once | ORAL | Status: AC
  Administered 2012-03-18: 2.5 mg via ORAL
  Filled 2012-03-18: qty 1

## 2012-03-18 MED ORDER — WARFARIN VIDEO
1.0000 | Freq: Once | Status: AC
  Administered 2012-03-19: 1

## 2012-03-18 MED ORDER — ENOXAPARIN SODIUM 40 MG/0.4ML ~~LOC~~ SOLN
40.0000 mg | SUBCUTANEOUS | Status: DC
  Administered 2012-03-18: 40 mg via SUBCUTANEOUS
  Filled 2012-03-18 (×2): qty 0.4

## 2012-03-18 MED ORDER — PNEUMOCOCCAL VAC POLYVALENT 25 MCG/0.5ML IJ INJ
0.5000 mL | INJECTION | INTRAMUSCULAR | Status: DC
Start: 2012-03-19 — End: 2012-03-19
  Filled 2012-03-18: qty 0.5

## 2012-03-18 MED ORDER — EZETIMIBE-SIMVASTATIN 10-10 MG PO TABS: 1.0000 | ORAL_TABLET | Freq: Every day | ORAL | Status: DC

## 2012-03-18 NOTE — Progress Notes (Signed)
VASCULAR LAB PRELIMINARY  PRELIMINARY  PRELIMINARY  PRELIMINARY  Carotid Dopplers completed.    Preliminary report:  There is 40-59% right ICA stenosis, mid range of scale.  There is no left ICA stenosis.  Bilateral vertebral artery flow is antegrade.  [name], 03/18/2012, 9:15 AM

## 2012-03-18 NOTE — Progress Notes (Addendum)
Stroke Team Progress Note  HISTORY [name] is an 78 y.o. female who was getting dressed 03/17/2012. She acutely became dizzy and when she went to grab the bedrail she noted that her right arm was weak. Patient was brought in by private vehicle. Code stroke was called. Initial CT was unremarkable. She received TPA. She was admitted to the neuro ICU for further evaluation and treatment.  SUBJECTIVE Her daughter, Vernona Rieger (NP for [facility]) is at the bedside.  Overall she feels her condition is gradually improving. Her short term memory waxes and wanes per daughter.  OBJECTIVE Most recent Vital Signs: Filed Vitals:   03/18/12 0400 03/18/12 0500 03/18/12 0600 03/18/12 0700  BP: 165/76 153/65 160/64 150/67  Pulse: 61 61 61 60  Temp: 98.4 F (36.9 C)     TempSrc: Oral     Resp: 16 13 22 18   Height:      Weight:      SpO2: 96% 96% 97% 96%   CBG (last 3)   Basename 03/17/12 1228  GLUCAP 226*    IV Fluid Intake:     . sodium chloride 50 mL/hr at 03/17/12 1839    MEDICATIONS    . sodium chloride  250 mL Intravenous Once  . alteplase  0.9 mg/kg Intravenous Once  . influenza  inactive virus vaccine  0.5 mL Intramuscular Tomorrow-1000  . lidocaine  1 patch Transdermal Q24H  . pantoprazole (PROTONIX) IV  40 mg Intravenous QHS  . pneumococcal 23 valent vaccine  0.5 mL Intramuscular Tomorrow-1000   PRN:  labetalol, ondansetron (ZOFRAN) IV, senna-docusate, traMADol  Diet:  Dysphagia 1 thin liquids Activity:  Bedrest DVT Prophylaxis:  SCDs   CLINICALLY SIGNIFICANT STUDIES Basic Metabolic Panel:  Lab 03/17/12 1610 03/17/12 1240  NA 137 135  K 4.2 4.1  CL 101 97  CO2 -- 25  GLUCOSE 228* 240*  BUN 55* 55*  CREATININE 2.30* 2.37*  CALCIUM -- 10.2  MG -- --  PHOS -- --   Liver Function Tests:  Lab 03/17/12 1240  AST 20  ALT 13  ALKPHOS 98  BILITOT 0.3  PROT 6.8  ALBUMIN 3.5   CBC:  Lab 03/17/12 1243 03/17/12 1240  WBC -- 6.7  NEUTROABS -- 4.2  HGB  10.5* 10.1*  HCT 31.0* 32.3*  MCV -- 96.7  PLT -- 247   Coagulation:  Lab 03/17/12 1240  LABPROT 12.7  INR 0.96   Cardiac Enzymes:  Lab 03/17/12 1240  CKTOTAL --  CKMB --  CKMBINDEX --  TROPONINI <0.30   Urinalysis: No results found for this basename: COLORURINE:2,APPERANCEUR:2,LABSPEC:2,PHURINE:2,GLUCOSEU:2,HGBUR:2,BILIRUBINUR:2,KETONESUR:2,PROTEINUR:2,UROBILINOGEN:2,NITRITE:2,LEUKOCYTESUR:2 in the last 168 hours Lipid Panel    Component Value Date/Time   CHOL 157 03/18/2012 0500   TRIG 146 03/18/2012 0500   HDL 62 03/18/2012 0500   CHOLHDL 2.5 03/18/2012 0500   VLDL 29 03/18/2012 0500   LDLCALC 66 03/18/2012 0500   HgbA1C  Lab Results  Component Value Date   HGBA1C 6.2* 12/13/2011    Urine Drug Screen:   No results found for this basename: labopia, cocainscrnur, labbenz, amphetmu, thcu, labbarb    Alcohol Level: No results found for this basename: ETH:2 in the last 168 hours   CT of the brain  03/17/2012    1.  No acute intracranial abnormality. 2.  Diffuse cerebral atrophy and extensive chronic small vessel disease.  Old right basal ganglia lacunar infarct.  MRI of the brain  pacer  MRA of the brain  pacer  2D Echocardiogram  Carotid Doppler    CXR    EKG  sinus tachycardia.   Therapy Recommendations PT - ; OT - ; ST -   Physical Exam  Pleasant obese elderly Caucasian lady currently not in distress.Awake alert. Afebrile. Head is nontraumatic. Neck is supple without bruit. Hearing is normal. Cardiac exam no murmur or gallop. Lungs are clear to auscultation. Distal pulses are well felt.  Neurological Exam : awake alert oriented to place and person. Follows simple one and q.2 step commands. Diminished attention, short-term memory, registration and recall. No aphasia apraxia or dysarthria. Speech is likely hesitant and nonfluent. Extraocular moments are full range without nystagmus. She blinks to threat bilaterally. Fundi were not visualized. Vision acuity and  fields appear normal. And mild right lower facial symmetry. Tongue is midline. Cough and gag appear adequate. Motor system exam reveals no upper or lower expected drift but fine finger movements are diminished on the right. Right grip is weaker than the left. She orbits left over right upper extremity. There is minimum right finger-to-nose dysmetria. Lower extremity strength is symmetric. Deep tendon reflexes are 1+ symmetric. Plantars are downgoing. Touch and pinprick sensation appear preserved bilaterally. Gait was not tested. ASSESSMENT [name] is a 78 y.o. female presenting with dizziness, right arm weakness. Status post IV t-PA 03/17/2012 at 1325. Left brain stroke suspected. Work up underway. On aspirin 81 mg orally every day prior to admission. Now on no antiplatelets secondary to within 24h period post tPA for secondary stroke prevention. Patient with resultant right arm hemiparesis.  atrial fibrillation Diabetes, HgbA1c pending Hyperlipidemia, LDL 66, on statin PTA, goal LDL < 70 Chronic kidney disease, stage 4 SSS with Pacemaker Aortic stenosis, moderate to severe Chronic UTI Chronic anemia due to renal disease  Hospital day # 1  TREATMENT/PLAN  Recommend anticoagulation for secondary stroke prevention. Will need first dose prior to 12 midnight tonight if imaging negative for hemorrhage. CT for 1300 today. No MRI secondary to pacer. No CTA secondary to creatinine.  OOB. Therapy evals.  F.u 2D, carotids, CT    Add TCD  Discussed with patient and daughter and answered questions This patient is critically ill and at significant risk of neurological worsening, death and care requires constant monitoring of vital signs, hemodynamics,respiratory and cardiac monitoring,review of multiple databases, neurological assessment, discussion with family, other specialists and medical decision making of high complexity. I spent 30 minutes of neurocritical care time  in the care of  this  patient.   Annie Main, MSN, RN, ANVP-BC, ANP-BC, [facility] Pager: [phone] 03/18/2012 8:24 AM  Scribe for Dr. Delia Heady, Stroke Center Medical Director, who has personally reviewed chart, pertinent data, examined the patient and developed the plan of care. Pager:  [phone]

## 2012-03-18 NOTE — Evaluation (Signed)
Speech Language Pathology Evaluation Patient Details Name: Shawntee Mainwaring MRN: [id number] DOB: Aug 20, 1924 Today's Date: 03/18/2012 Time: 6433-2951 SLP Time Calculation (min): 25 min  Problem List:  Patient Active Problem List  Diagnosis  . Aortic stenosis, severe  . Mitral regurgitation, moderate to severe  . PAF (paroxysmal atrial fibrillation)  . Diabetes mellitus, insulin dependent (IDDM), controlled  . History of drug-induced hemolytic anemia quinine  . SSS (sick sinus syndrome) with medtronic pacemaker, placed 6 years ago, with micro dislodgement of atrial lead, though still paces--for CHB and paf  . UTI (lower urinary tract infection), curent and history of UTIs on Keflex daily  . CAD (coronary artery disease), cardiac cath   . Anemia of chronic disease  . Near syncope with AF/AT  . CKD (chronic kidney disease) stage 5, GFR less than 15 ml/min  . CHF (congestive heart failure)  . Chronic combined systolic and diastolic CHF, NYHA class 2, in combination with AS, MR  . Cerebral embolism with cerebral infarction  . Hemiplegia, unspecified, affecting dominant side  . Hyperlipidemia LDL goal < 70   Past Medical History:  Past Medical History  Diagnosis Date  . Diabetes mellitus, insulin dependent (IDDM), controlled   . Chronic kidney disease, stage 4, severely decreased GFR     likely related to diabetic and hypertensive nephropathy, no evidence of renal artery stenosis by duplex US in 2008  . Mitral valve disorder   . Pacemaker November 2007    s/p placement complete heart block status  . Near syncope 12/12/2011  . Aortic stenosis, moderate to severe 12/12/2011  . Chronic degenerative aortic valve disease 12/12/2011  . PAF (paroxysmal atrial fibrillation) 12/12/2011  . H/O hemolytic anemia due to drugs 12/12/2011  . SSS (sick sinus syndrome) with medtronic pacemaker, placed 6 years ago, with micro dislodgement of atrial lead, though still paces--for CHB and paf 12/12/2011  . UTI (lower  urinary tract infection), curent and history of UTIs on Keflex daily 12/12/2011  . CAD (coronary artery disease), cardiac cath  12/12/2011  . Anemia, secondary to chronic disease 12/12/2011  . Pacemaker complications     Known chronic dysfunction of atrial lead with high pacing trhesholds but good sensing.  . Chronic combined systolic and diastolic CHF, NYHA class 2, in combination with AS, MR 12/15/2011  . Bleeding of eye retinal eye bleed   Past Surgical History:  Past Surgical History  Procedure Date  . Pacemaker insertion November 2007    Medtronic EnRhythm dual-chamber [id number]  . Cardiac catheterization 2005    60% stenosis of 1st diagnoal branch, 60-70% stenosis of posterolateral brach of distal RCA, preserved LV with EF 50-55%, diastolic dysfunction  . Eye surgery retinal eye bleed  . Abdominal hysterectomy    HPI:  78 y.o. female presenting with RUE weakness and dysmetria.  No further complaints of dizziness.  Ct shows calcification of vessels but no evidence of hemorrhage.  Due to the fact  That the right arm was affected so significantly it was determined that the patient would recieve tPA.  The risks and benefits were discussed with both the patient and the daughter.  Verbal consent was obtained and tPA was administered.   Assessment / Plan / Recommendation Clinical Impression  Pt presents with mild baseline cognitive deficits likely unchanged from baseline. Pt and daughter reprot memory deficits with appropriate supervision and assistance in the home to compensate. Pt also with impaired emergent awareness of new physical deficits (pt triying to get up to use the restroom  without help) but able to verbalize anticipatory awareness ("I think l should use the bedside commode next time").  Pt did demosntrate high level problem solving and reasoning, Divided attention intact. Do not feel pt will need any further SLP treatment at this time. Recommend pt f/u with PT/OT for improved safety  awareness and precuations and continue with full supervision at home with complex functional tasks.     SLP Assessment  Patient does not need any further Speech Lanaguage Pathology Services    Follow Up Recommendations       Frequency and Duration        Pertinent Vitals/Pain NA   SLP Goals     SLP Evaluation Prior Functioning  Cognitive/Linguistic Baseline: Baseline deficits Baseline deficit details: memory Type of Home: House Lives With: Family Available Help at Discharge: Family;Available PRN/intermittently Vocation: Retired   IT consultant  Overall Cognitive Status: Impaired Arousal/Alertness: Awake/alert Orientation Level: Oriented X4 Attention: Focused;Sustained;Selective;Alternating Focused Attention: Appears intact Sustained Attention: Appears intact Selective Attention: Appears intact Alternating Attention: Appears intact Memory: Impaired Memory Impairment: Decreased recall of new information;Other (comment) (mild baseline defcitis persist) Awareness: Impaired Awareness Impairment: Emergent impairment Problem Solving: Appears intact    Comprehension  Auditory Comprehension Overall Auditory Comprehension: Appears within functional limits for tasks assessed    Expression Verbal Expression Overall Verbal Expression: Appears within functional limits for tasks assessed Written Expression Dominant Hand: Right   Oral / Motor Oral Motor/Sensory Function Overall Oral Motor/Sensory Function: Appears within functional limits for tasks assessed Motor Speech Overall Motor Speech: Appears within functional limits for tasks assessed   GO    Harlon Ditty, MA CCC-SLP [phone]  Claudine Mouton 03/18/2012, 4:24 PM

## 2012-03-18 NOTE — Progress Notes (Signed)
  Echocardiogram 2D Echocardiogram has been performed.  [name] 03/18/2012, 10:25 AM

## 2012-03-18 NOTE — Progress Notes (Signed)
OT Cancellation Note  Patient Details Name: [name] MRN: [id number] DOB: [birth year]   Cancelled Treatment:    Reason Eval/Treat Not Completed: Patient not medically ready (s/p TPA bedrest)  Harrel Carina [facility] 03/18/2012, 7:46 AM

## 2012-03-18 NOTE — Evaluation (Signed)
Physical Therapy Evaluation Patient Details Name: [name] MRN: [id number] DOB: 01/29/1925 Today's Date: 03/18/2012 Time: 6213-0865 PT Time Calculation (min): 37 min  PT Assessment / Plan / Recommendation Clinical Impression  pt admitted with R sided weakness UE weaker than LE.  Mobility/gait limited by decr coordination and gait instability as well as continued  weakness.  Will see on acute.    PT Assessment  Patient needs continued PT services    Follow Up Recommendations  Outpatient PT;Supervision for mobility/OOB    Does the patient have the potential to tolerate intense rehabilitation      Barriers to Discharge        Equipment Recommendations  None recommended by PT    Recommendations for Other Services     Frequency Min 3X/week    Precautions / Restrictions Precautions Precautions: Fall   Pertinent Vitals/Pain       Mobility  Bed Mobility Bed Mobility: Supine to Sit;Sitting - Scoot to Edge of Bed Supine to Sit: 4: Min guard;With rails;HOB elevated Sitting - Scoot to Edge of Bed: 4: Min guard Details for Bed Mobility Assistance: needed rail and extra time Transfers Transfers: Sit to Stand;Stand to Sit Sit to Stand: 4: Min assist;With upper extremity assist;From bed Stand to Sit: 4: Min assist;With upper extremity assist;To chair/3-in-1 Details for Transfer Assistance: vc's for hand placement; minimal steadying assist Ambulation/Gait Ambulation/Gait Assistance: 1: +2 Total assist;3: Mod assist Ambulation/Gait: Patient Percentage: 60% Ambulation Distance (Feet): 70 Feet Assistive device: 1 person hand held assist;2 person hand held assist Ambulation/Gait Assistance Details: mildly unsteady throughout; short scissored steps, narrow BOS. mild list R and pulling against assist on the Left Gait Pattern: Step-through pattern;Decreased step length - right;Decreased step length - left;Decreased stride length;Scissoring;Narrow base of support Stairs:  No Wheelchair Mobility Wheelchair Mobility: No Modified Rankin (Stroke Patients Only) Pre-Morbid Rankin Score: No symptoms Modified Rankin: Moderately severe disability    Shoulder Instructions     Exercises     PT Diagnosis: Difficulty walking;Abnormality of gait;Acute pain  PT Problem List: Decreased strength;Decreased activity tolerance;Decreased balance;Decreased mobility;Decreased coordination;Decreased knowledge of use of DME PT Treatment Interventions: Gait training;DME instruction;Functional mobility training;Stair training;Therapeutic activities;Balance training;Patient/family education   PT Goals Acute Rehab PT Goals PT Goal Formulation: With patient Time For Goal Achievement: 04/01/12 Potential to Achieve Goals: Good Pt will go Supine/Side to Sit: with modified independence PT Goal: Supine/Side to Sit - Progress: Goal set today Pt will go Sit to Stand: with supervision PT Goal: Sit to Stand - Progress: Goal set today Pt will Transfer Bed to Chair/Chair to Bed: with supervision PT Transfer Goal: Bed to Chair/Chair to Bed - Progress: Goal set today Pt will Ambulate: 51 - 150 feet;with supervision;with least restrictive assistive device PT Goal: Ambulate - Progress: Goal set today Pt will Go Up / Down Stairs: 6-9 stairs;with supervision;with rail(s);with least restrictive assistive device PT Goal: Up/Down Stairs - Progress: Goal set today  Visit Information  Last PT Received On: 03/18/12 Assistance Needed: +2 PT/OT Co-Evaluation/Treatment: Yes    Subjective Data  Subjective: My walking is not as good as when I walked in. Patient Stated Goal: to be Independent at home   Prior Functioning  Home Living Lives With: Family Available Help at Discharge: Family;Available PRN/intermittently Type of Home: House Home Access: Stairs to enter [employer] of Steps: 11 Entrance Stairs-Rails: Can reach both Home Layout: One level Bathroom Shower/Tub: Tub/shower  unit;Curtain Bathroom Toilet: Handicapped height Bathroom Accessibility: Yes How Accessible: Accessible via walker Home Adaptive Equipment:  Walker - rolling;Straight cane;Bedside commode/3-in-1;Grab bars in shower;Shower chair with back;Walker - standard Prior Function Level of Independence: Independent;Independent with assistive device(s) Able to Take Stairs?: Reciprically Driving: No Vocation: Retired Musician: No difficulties Dominant Hand: Right    Cognition  Overall Cognitive Status: Appears within functional limits for tasks assessed/performed Arousal/Alertness: Awake/alert Orientation Level: Appears intact for tasks assessed Behavior During Session: [facility] for tasks performed    Extremity/Trunk Assessment Right Lower Extremity Assessment RLE ROM/Strength/Tone: [facility] for tasks assessed;Deficits RLE ROM/Strength/Tone Deficits: weak hamstrings at 3+/5 and pf 3+5 RLE Coordination: Deficits Left Lower Extremity Assessment LLE ROM/Strength/Tone: Within functional levels LLE Sensation: Deficits LLE Sensation Deficits: bil diminished sensation Trunk Assessment Trunk Assessment: Normal   Balance Balance Balance Assessed: Yes Dynamic Sitting Balance Dynamic Sitting - Balance Support: No upper extremity supported;Feet supported;During functional activity Dynamic Sitting - Level of Assistance: Other (comment) (min guard assist) Dynamic Sitting Balance - Compensations: pulls leg up onto bed to stabilize it for donning socks Static Standing Balance Static Standing - Balance Support: During functional activity;Right upper extremity supported;Left upper extremity supported;Bilateral upper extremity supported Static Standing - Level of Assistance: 4: Min assist  End of Session PT - End of Session Activity Tolerance: Patient tolerated treatment well Patient left: in chair;with call bell/phone within reach;with family/visitor present Nurse Communication: Mobility status    GP     Corky Blumstein, Eliseo Gum 03/18/2012, 3:46 PM  03/18/2012  [place] Bing, PT [phone] (pager)

## 2012-03-18 NOTE — Consult Note (Signed)
ANTICOAGULATION CONSULT NOTE - Initial Consult  Pharmacy Consult for Coumadin w/Lovenox bridging, s/p TPA infusion. Indication: CVA and PAF  Allergies: Allergies  Allergen Reactions  . Floxin (Ofloxacin) Other (See Comments)    nightmares  . Sulfa Antibiotics Other (See Comments)    From when younger  . Darvocet (Propoxyphene-Acetaminophen) Rash  . Penicillins Rash    Height/Weight: Height: 4\' 11"  (149.9 cm) Weight: 142 lb 6.7 oz (64.6 kg) IBW/kg (Calculated) : 43.2   Vital Signs: BP 152/64  Pulse 61  Temp 98.5 F (36.9 C) (Oral)  Resp 18  Ht 4\' 11"  (1.499 m)  Wt 142 lb 6.7 oz (64.6 kg)  BMI 28.76 kg/m2  SpO2 99%  Active Problems: Principal Problem:  *Cerebral embolism with cerebral infarction Active Problems:  PAF (paroxysmal atrial fibrillation)  Diabetes mellitus, insulin dependent (IDDM), controlled  UTI (lower urinary tract infection), curent and history of UTIs on Keflex daily  CKD (chronic kidney disease) stage 5, GFR less than 15 ml/min  Hemiplegia, unspecified, affecting dominant side  Hyperlipidemia LDL goal < 70  Labs:  Basename 03/17/12 1243 03/17/12 1240  HGB 10.5* 10.1*  HCT 31.0* 32.3*  PLT -- 247  APTT -- 31  LABPROT -- 12.7  INR -- 0.96  CREATININE 2.30* 2.37*  TROPONINI -- <0.30   Lab Results  Component Value Date   INR 0.96 03/17/2012   Estimated Creatinine Clearance: 14.6 ml/min (by C-G formula based on Cr of 2.3).  Medical / Surgical History: Past Medical History  Diagnosis Date  . Diabetes mellitus, insulin dependent (IDDM), controlled   . Chronic kidney disease, stage 4, severely decreased GFR     likely related to diabetic and hypertensive nephropathy, no evidence of renal artery stenosis by duplex US in 2008  . Mitral valve disorder   . Pacemaker November 2007    s/p placement complete heart block status  . Near syncope 12/12/2011  . Aortic stenosis, moderate to severe 12/12/2011  . Chronic degenerative aortic valve disease  12/12/2011  . PAF (paroxysmal atrial fibrillation) 12/12/2011  . H/O hemolytic anemia due to drugs 12/12/2011  . SSS (sick sinus syndrome) with medtronic pacemaker, placed 6 years ago, with micro dislodgement of atrial lead, though still paces--for CHB and paf 12/12/2011  . UTI (lower urinary tract infection), curent and history of UTIs on Keflex daily 12/12/2011  . CAD (coronary artery disease), cardiac cath  12/12/2011  . Anemia, secondary to chronic disease 12/12/2011  . Pacemaker complications     Known chronic dysfunction of atrial lead with high pacing trhesholds but good sensing.  . Chronic combined systolic and diastolic CHF, NYHA class 2, in combination with AS, MR 12/15/2011  . Bleeding of eye retinal eye bleed   Past Surgical History  Procedure Date  . Pacemaker insertion November 2007    Medtronic EnRhythm dual-chamber [id number]  . Cardiac catheterization 2005    60% stenosis of 1st diagnoal branch, 60-70% stenosis of posterolateral brach of distal RCA, preserved LV with EF 50-55%, diastolic dysfunction  . Eye surgery retinal eye bleed  . Abdominal hysterectomy     Medications:  Prescriptions prior to admission  Medication Sig Dispense Refill  . acetaminophen (TYLENOL) 500 MG tablet Take 1,000 mg by mouth 3 (three) times daily as needed. For pain      . amiodarone (PACERONE) 200 MG tablet Take 200 mg by mouth daily.       Marland Kitchen aspirin EC 81 MG tablet Take 81 mg by mouth daily.      Marland Kitchen  calcitRIOL (ROCALTROL) 0.25 MCG capsule Take 0.25 mcg by mouth every other day.       . cephALEXin (KEFLEX) 250 MG capsule Take 250 mg by mouth daily.      Marland Kitchen docusate sodium (COLACE) 100 MG capsule Take 200 mg by mouth daily.      . Estradiol (VAGIFEM) 10 MCG TABS Place 1 tablet vaginally once a week. Insert on Wednesdays.      Marland Kitchen ezetimibe-simvastatin (VYTORIN) 10-10 MG per tablet Take 1 tablet by mouth daily.      . furosemide (LASIX) 40 MG tablet Take 40 mg by mouth every morning.      . insulin aspart  (NOVOLOG) 100 UNIT/ML injection Inject 8 Units into the skin 3 (three) times daily before meals. Per sliding scale.      . insulin glargine (LANTUS) 100 UNIT/ML injection Inject 16 Units into the skin every morning.       . potassium chloride (K-DUR) 10 MEQ tablet Take 10 mEq by mouth daily.      . Probiotic Product (ALIGN) 4 MG CAPS Take 1 capsule by mouth daily.      Marland Kitchen zolpidem (AMBIEN) 5 MG tablet Take 2.5 mg by mouth at bedtime. Patient takes another one-half tablet in the middle of night if she wakes up.       Scheduled:    . amiodarone  200 mg Oral Daily  . aspirin EC  81 mg Oral Daily  . calcitRIOL  0.25 mcg Oral QODAY  . cephALEXin  250 mg Oral Daily  . coumadin book  1 each Does not apply Once  . docusate sodium  200 mg Oral Daily  . enoxaparin (LOVENOX) injection  40 mg Subcutaneous Q24H  . ezetimibe  10 mg Oral QPC supper  . furosemide  40 mg Oral q morning - 10a  . influenza  inactive virus vaccine  0.5 mL Intramuscular Tomorrow-1000  . insulin aspart  8 Units Subcutaneous TID AC  . insulin glargine  16 Units Subcutaneous q morning - 10a  . lidocaine  1 patch Transdermal Q24H  . pantoprazole (PROTONIX) IV  40 mg Intravenous QHS  . pneumococcal 23 valent vaccine  0.5 mL Intramuscular Tomorrow-1000  . potassium chloride  10 mEq Oral Daily  . simvastatin  10 mg Oral q1800  . warfarin  2.5 mg Oral Once  . warfarin  1 each Does not apply Once  . Warfarin - Pharmacist Dosing Inpatient   Does not apply q1800  . zolpidem  2.5 mg Oral QHS  . DISCONTD: sodium chloride  250 mL Intravenous Once  . DISCONTD: ezetimibe-simvastatin  1 tablet Oral Daily   Assessment:  78 y.o.female admitted for Cerebral embolism with cerebral infarction and atrial fibrillation, s/p TPA infusion.  Patient to be started on Coumadin with Lovenox bridging.  CT negative for Hemmorhage.  Goal of Therapy:   INR 2-3 with desired Target INR of ~ 2.5   Plan:   Coumadin 2.5 mg x 1.  Continue Lovenox  bridging 40 mg sq q 24 hours. Coumadin discharge education started.  Maleny Candy, Elisha Headland, Pharm.D. 03/18/2012,  4:47 PM

## 2012-03-19 ENCOUNTER — Inpatient Hospital Stay (HOSPITAL_COMMUNITY)
Admission: RE | Admit: 2012-03-19 | Discharge: 2012-03-28 | DRG: 945 | Disposition: A | Discharge status: Home Health | Payer: Medicare Other | Source: Ambulatory Visit | Attending: Physical Medicine & Rehabilitation | Admitting: Physical Medicine & Rehabilitation

## 2012-03-19 DIAGNOSIS — E1129 Type 2 diabetes mellitus with other diabetic kidney complication: Secondary | ICD-10-CM

## 2012-03-19 DIAGNOSIS — N390 Urinary tract infection, site not specified: Secondary | ICD-10-CM

## 2012-03-19 DIAGNOSIS — E1142 Type 2 diabetes mellitus with diabetic polyneuropathy: Secondary | ICD-10-CM

## 2012-03-19 DIAGNOSIS — Z7982 Long term (current) use of aspirin: Secondary | ICD-10-CM

## 2012-03-19 DIAGNOSIS — D638 Anemia in other chronic diseases classified elsewhere: Secondary | ICD-10-CM

## 2012-03-19 DIAGNOSIS — Z950 Presence of cardiac pacemaker: Secondary | ICD-10-CM

## 2012-03-19 DIAGNOSIS — I6340 Cerebral infarction due to embolism of unspecified cerebral artery: Secondary | ICD-10-CM

## 2012-03-19 DIAGNOSIS — N184 Chronic kidney disease, stage 4 (severe): Secondary | ICD-10-CM

## 2012-03-19 DIAGNOSIS — I129 Hypertensive chronic kidney disease with stage 1 through stage 4 chronic kidney disease, or unspecified chronic kidney disease: Secondary | ICD-10-CM

## 2012-03-19 DIAGNOSIS — E1149 Type 2 diabetes mellitus with other diabetic neurological complication: Secondary | ICD-10-CM

## 2012-03-19 DIAGNOSIS — Z79899 Other long term (current) drug therapy: Secondary | ICD-10-CM

## 2012-03-19 DIAGNOSIS — I080 Rheumatic disorders of both mitral and aortic valves: Secondary | ICD-10-CM

## 2012-03-19 DIAGNOSIS — Z87891 Personal history of nicotine dependence: Secondary | ICD-10-CM

## 2012-03-19 DIAGNOSIS — I4891 Unspecified atrial fibrillation: Secondary | ICD-10-CM

## 2012-03-19 DIAGNOSIS — I2510 Atherosclerotic heart disease of native coronary artery without angina pectoris: Secondary | ICD-10-CM

## 2012-03-19 DIAGNOSIS — I639 Cerebral infarction, unspecified: Secondary | ICD-10-CM

## 2012-03-19 DIAGNOSIS — I6350 Cerebral infarction due to unspecified occlusion or stenosis of unspecified cerebral artery: Secondary | ICD-10-CM

## 2012-03-19 DIAGNOSIS — I6330 Cerebral infarction due to thrombosis of unspecified cerebral artery: Secondary | ICD-10-CM

## 2012-03-19 DIAGNOSIS — G4700 Insomnia, unspecified: Secondary | ICD-10-CM

## 2012-03-19 DIAGNOSIS — Z5189 Encounter for other specified aftercare: Principal | ICD-10-CM

## 2012-03-19 DIAGNOSIS — Z794 Long term (current) use of insulin: Secondary | ICD-10-CM

## 2012-03-19 DIAGNOSIS — I5043 Acute on chronic combined systolic (congestive) and diastolic (congestive) heart failure: Secondary | ICD-10-CM

## 2012-03-19 DIAGNOSIS — I509 Heart failure, unspecified: Secondary | ICD-10-CM

## 2012-03-19 LAB — CBC
HCT: 27.9 % — ABNORMAL LOW (ref 36.0–46.0)
Hemoglobin: 8.8 g/dL — ABNORMAL LOW (ref 12.0–15.0)
MCV: 94.9 fL (ref 78.0–100.0)
RBC: 2.94 MIL/uL — ABNORMAL LOW (ref 3.87–5.11)
WBC: 8.8 10*3/uL (ref 4.0–10.5)

## 2012-03-19 LAB — GLUCOSE, CAPILLARY
Glucose-Capillary: 183 mg/dL — ABNORMAL HIGH (ref 70–99)
Glucose-Capillary: 164 mg/dL — ABNORMAL HIGH (ref 70–99)

## 2012-03-19 MED ORDER — SORBITOL 70 % SOLN: 30.0000 mL | Freq: Every day | Status: DC | PRN

## 2012-03-19 MED ORDER — ONDANSETRON HCL 4 MG PO TABS: 4.0000 mg | ORAL_TABLET | Freq: Four times a day (QID) | ORAL | Status: DC | PRN

## 2012-03-19 MED ORDER — TRAZODONE HCL 50 MG PO TABS
25.0000 mg | ORAL_TABLET | Freq: Every evening | ORAL | Status: DC | PRN
  Administered 2012-03-19: 50 mg via ORAL
  Filled 2012-03-19: qty 1

## 2012-03-19 MED ORDER — TRAMADOL HCL 50 MG PO TABS: 50.0000 mg | ORAL_TABLET | Freq: Two times a day (BID) | ORAL | Status: DC | PRN

## 2012-03-19 MED ORDER — EZETIMIBE 10 MG PO TABS
10.0000 mg | ORAL_TABLET | Freq: Every day | ORAL | Status: DC
  Administered 2012-03-19 – 2012-03-27 (×9): 10 mg via ORAL
  Filled 2012-03-19 (×10): qty 1

## 2012-03-19 MED ORDER — INSULIN ASPART 100 UNIT/ML ~~LOC~~ SOLN: 8.0000 [IU] | Freq: Three times a day (TID) | SUBCUTANEOUS | Status: DC

## 2012-03-19 MED ORDER — WARFARIN - PHARMACIST DOSING INPATIENT: Freq: Every day | Status: DC

## 2012-03-19 MED ORDER — LIDOCAINE 5 % EX PTCH
1.0000 | MEDICATED_PATCH | CUTANEOUS | Status: DC
  Filled 2012-03-19 (×4): qty 1

## 2012-03-19 MED ORDER — INSULIN GLARGINE 100 UNIT/ML ~~LOC~~ SOLN
16.0000 [IU] | Freq: Every morning | SUBCUTANEOUS | Status: DC
  Administered 2012-03-20 – 2012-03-28 (×9): 16 [IU] via SUBCUTANEOUS

## 2012-03-19 MED ORDER — ENOXAPARIN SODIUM 40 MG/0.4ML ~~LOC~~ SOLN: 40.0000 mg | SUBCUTANEOUS | Status: DC

## 2012-03-19 MED ORDER — COUMADIN BOOK
1.0000 | Freq: Once | Status: DC
  Filled 2012-03-19: qty 1

## 2012-03-19 MED ORDER — WARFARIN SODIUM 2.5 MG PO TABS
2.5000 mg | ORAL_TABLET | Freq: Once | ORAL | Status: DC
Start: 2012-03-19 — End: 2012-03-19
  Filled 2012-03-19: qty 1

## 2012-03-19 MED ORDER — CALCITRIOL 0.25 MCG PO CAPS
0.2500 ug | ORAL_CAPSULE | ORAL | Status: DC
  Administered 2012-03-20 – 2012-03-28 (×5): 0.25 ug via ORAL
  Filled 2012-03-19 (×5): qty 1

## 2012-03-19 MED ORDER — AMIODARONE HCL 200 MG PO TABS
200.0000 mg | ORAL_TABLET | Freq: Every day | ORAL | Status: DC
  Administered 2012-03-20 – 2012-03-28 (×9): 200 mg via ORAL
  Filled 2012-03-19 (×10): qty 1

## 2012-03-19 MED ORDER — CEPHALEXIN 250 MG PO CAPS
250.0000 mg | ORAL_CAPSULE | Freq: Every day | ORAL | Status: DC
  Administered 2012-03-20 – 2012-03-28 (×9): 250 mg via ORAL
  Filled 2012-03-19 (×10): qty 1

## 2012-03-19 MED ORDER — POLYETHYLENE GLYCOL 3350 17 G PO PACK
17.0000 g | PACK | Freq: Every day | ORAL | Status: DC | PRN
  Filled 2012-03-19: qty 1

## 2012-03-19 MED ORDER — WARFARIN SODIUM 2.5 MG PO TABS
2.5000 mg | ORAL_TABLET | Freq: Once | ORAL | Status: AC
  Administered 2012-03-19: 2.5 mg via ORAL
  Filled 2012-03-19: qty 1

## 2012-03-19 MED ORDER — ASPIRIN EC 81 MG PO TBEC
81.0000 mg | DELAYED_RELEASE_TABLET | Freq: Every day | ORAL | Status: DC
  Administered 2012-03-20 – 2012-03-28 (×9): 81 mg via ORAL
  Filled 2012-03-19 (×10): qty 1

## 2012-03-19 MED ORDER — ONDANSETRON HCL 4 MG/2ML IJ SOLN: 4.0000 mg | Freq: Four times a day (QID) | INTRAMUSCULAR | Status: DC | PRN

## 2012-03-19 MED ORDER — ACETAMINOPHEN 325 MG PO TABS
325.0000 mg | ORAL_TABLET | ORAL | Status: DC | PRN
  Administered 2012-03-19 – 2012-03-24 (×6): 650 mg via ORAL
  Administered 2012-03-24: 325 mg via ORAL
  Administered 2012-03-25 – 2012-03-27 (×5): 650 mg via ORAL
  Filled 2012-03-19 (×12): qty 2

## 2012-03-19 MED ORDER — FUROSEMIDE 40 MG PO TABS
40.0000 mg | ORAL_TABLET | Freq: Every morning | ORAL | Status: DC
  Administered 2012-03-20 – 2012-03-28 (×9): 40 mg via ORAL
  Filled 2012-03-19 (×9): qty 1

## 2012-03-19 MED ORDER — SIMVASTATIN 10 MG PO TABS
10.0000 mg | ORAL_TABLET | Freq: Every day | ORAL | Status: DC
  Administered 2012-03-19 – 2012-03-27 (×9): 10 mg via ORAL
  Filled 2012-03-19 (×10): qty 1

## 2012-03-19 MED ORDER — POTASSIUM CHLORIDE ER 10 MEQ PO TBCR
10.0000 meq | EXTENDED_RELEASE_TABLET | Freq: Every day | ORAL | Status: DC
  Administered 2012-03-20 – 2012-03-28 (×9): 10 meq via ORAL
  Filled 2012-03-19 (×11): qty 1

## 2012-03-19 NOTE — Progress Notes (Signed)
Would like to admit pt to CIR today.  Pt & daughter agreeable to CIR.  [phone]

## 2012-03-19 NOTE — Progress Notes (Signed)
ANTICOAGULATION CONSULT NOTE - Follow Up Consult  Pharmacy Consult for Coumadin w/Lovenox bridging, s/p TPA infusion. Indication: CVA and PAF   Allergies  Allergen Reactions  . Floxin (Ofloxacin) Other (See Comments)    nightmares  . Sulfa Antibiotics Other (See Comments)    From when younger  . Darvocet (Propoxyphene-Acetaminophen) Rash  . Penicillins Rash    Patient Measurements: Height: 4\' 11"  (149.9 cm) Weight: 142 lb 6.7 oz (64.6 kg) IBW/kg (Calculated) : 43.2  Heparin Dosing Weight:   Vital Signs: Temp: 98 F (36.7 C) (10/15 1009) Temp src: Oral (10/15 0540) BP: 147/47 mmHg (10/15 1009) Pulse Rate: 59  (10/15 1009)  Labs:  [facility] 03/19/12 0550 03/17/12 1243 03/17/12 1240  HGB 8.8* 10.5* --  HCT 27.9* 31.0* 32.3*  PLT 243 -- 247  APTT -- -- 31  LABPROT 14.2 -- 12.7  INR 1.11 -- 0.96  HEPARINUNFRC -- -- --  CREATININE -- 2.30* 2.37*  CKTOTAL -- -- --  CKMB -- -- --  TROPONINI -- -- <0.30    Estimated Creatinine Clearance: 14.6 ml/min (by C-G formula based on Cr of 2.3).  Assessment:78 y.o.female admitted for Cerebral embolism with cerebral infarction and atrial fibrillation, s/p TPA infusion. CT negative for hemorrhage  Anticoagulation: Lovenox 40mg /24h. INR 1.11 loading Coumadin. Hgb 8.8. Watch  Infectious Disease: UTIs on chronic Keflex  Cardiovascular: MV disorder, Chronic degenerative aortic valve disease , SSS with PPM, AS, PAF, CAD, CHF with EF 50-55%, HLD Meds: amio, ASA 81mg , Zetia, po Lasix, K, Zocor  Endocrinology: DM, CBGs 104-178 on SSI and Lantus  Gastrointestinal / Nutrition  Neurology: Peripheral neuropathy.  Lidoderm patch  Nephrology: CKD (baseline Scr 2.3)  Pulmonary  Hematology / Oncology: anemia  PTA Medication Issues: Estradiol vaginal tabbs, Align probiotic,    Goal of Therapy:  INR around 2.5 Monitor platelets by anticoagulation protocol: Yes   Plan:  Lovenox 40mg /24h Repeat Coumadin 2.5mg  po x 1  tonight.   Merilynn Finland, [employer] 03/19/2012,11:19 AM

## 2012-03-19 NOTE — Progress Notes (Signed)
Physical Therapy Treatment Patient Details Name: [name] MRN: [id number] DOB: [birth year] Today's Date: 03/19/2012 Time: 4098-1191 PT Time Calculation (min): 33 min  PT Assessment / Plan / Recommendation Comments on Treatment Session  Patient progressing very well with overall strength and mobility. Patient is planning on CIR stay this afternoon.     Follow Up Recommendations        Does the patient have the potential to tolerate intense rehabilitation     Barriers to Discharge        Equipment Recommendations  None recommended by PT;None recommended by OT    Recommendations for Other Services    Frequency Min 3X/week   Plan Discharge plan remains appropriate;Frequency remains appropriate    Precautions / Restrictions Precautions Precautions: Fall   Pertinent Vitals/Pain     Mobility  Bed Mobility Bed Mobility: Not assessed Supine to Sit: 4: Min guard;With rails;HOB elevated Sitting - Scoot to Edge of Bed: 4: Min guard Details for Bed Mobility Assistance: needed rail and extra time Transfers Sit to Stand: 4: Min guard;With upper extremity assist;Without upper extremity assist Stand to Sit: 4: Min guard;With upper extremity assist Details for Transfer Assistance: Cues for safe hand placement and positioning for sitting Ambulation/Gait Ambulation/Gait Assistance: 4: Min assist Ambulation Distance (Feet): 200 Feet Assistive device: Rolling walker Ambulation/Gait Assistance Details: Min A for initial truncal control with gait, progressed as patient ambulation increased. Cues and A for RW placement. Patient uses rollator at home Gait Pattern: Step-through pattern;Decreased stride length;Trunk flexed    Exercises     PT Diagnosis:    PT Problem List:   PT Treatment Interventions:     PT Goals Acute Rehab PT Goals PT Goal: Sit to Stand - Progress: Progressing toward goal PT Transfer Goal: Bed to Chair/Chair to Bed - Progress: Progressing toward goal PT Goal:  Ambulate - Progress: Progressing toward goal  Visit Information  Last PT Received On: 03/19/12 Assistance Needed: +1    Subjective Data      Cognition  Overall Cognitive Status: Appears within functional limits for tasks assessed/performed Arousal/Alertness: Awake/alert Orientation Level: Appears intact for tasks assessed Behavior During Session: [facility] for tasks performed Cognition - Other Comments: 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 Patient stood x 15 mins  End of Session PT - End of Session Equipment Utilized During Treatment: Gait belt Activity Tolerance: Patient tolerated treatment well Patient left: in chair;with call bell/phone within reach Nurse Communication: Mobility status   GP     Fredrich Birks 03/19/2012, 2:43 PM 03/19/2012 Fredrich Birks PTA [phone] pager [phone] office

## 2012-03-19 NOTE — Progress Notes (Signed)
Report called to 4000. Patient will be transported soon.

## 2012-03-19 NOTE — Progress Notes (Signed)
Occupational Therapy Treatment Patient Details Name: [name] MRN: [id number] DOB: Jul 12, 1924 Today's Date: 03/19/2012 Time: 0865-7846 OT Time Calculation (min): 25 min  OT Assessment / Plan / Recommendation Comments on Treatment Session This 78 yo making progress.    Follow Up Recommendations  Outpatient OT       Equipment Recommendations  None recommended by PT;None recommended by OT       Frequency Min 2X/week   Plan Discharge plan remains appropriate    Precautions / Restrictions Precautions Precautions: Fall       ADL  Grooming: Performed;Brushing hair;Wash/dry hands;Min guard Where Assessed - Grooming: Unsupported standing Lower Body Bathing: Simulated;Minimal assistance Where Assessed - Lower Body Bathing: Supported standing Where Assessed - Upper Body Dressing: Unsupported sitting Lower Body Dressing: Performed;Min guard Where Assessed - Lower Body Dressing: Unsupported sitting Toilet Transfer: Performed;Min guard Toilet Transfer Method: Sit to Barista: Comfort height toilet;Grab bars Toileting - Clothing Manipulation and Hygiene: Performed;Independent Where Assessed - Toileting Clothing Manipulation and Hygiene: Sit to stand from 3-in-1 or toilet Equipment Used: Gait belt Transfers/Ambulation Related to ADLs: Mod A with ambulation in room/hallways    OT Diagnosis: Generalized weakness;Acute pain;Paresis  OT Problem List: Decreased strength;Decreased range of motion;Impaired balance (sitting and/or standing);Decreased activity tolerance;Decreased coordination;Decreased knowledge of use of DME or AE;Decreased knowledge of precautions;Impaired sensation;Impaired UE functional use OT Treatment Interventions: Self-care/ADL training;DME and/or AE instruction;Therapeutic activities;Patient/family education;Balance training   OT Goals Acute Rehab OT Goals OT Goal Formulation: With patient Time For Goal Achievement: 03/25/12 Potential to  Achieve Goals: Good ADL Goals Pt Will Perform Grooming: Independently;Standing at sink ADL Goal: Grooming - Progress: Progressing toward goals Pt Will Perform Upper Body Dressing: Independently;Sitting, bed;Sitting, chair ADL Goal: Upper Body Dressing - Progress: Goal set today Pt Will Transfer to Toilet: with modified independence;Ambulation;with DME ADL Goal: Toilet Transfer - Progress: Progressing toward goals Pt Will Perform Toileting - Clothing Manipulation: Independently;Standing ADL Goal: Toileting - Clothing Manipulation - Progress: Progressing toward goals Pt Will Perform Toileting - Hygiene: Independently;Sit to stand from 3-in-1/toilet ADL Goal: Toileting - Hygiene - Progress: Met Pt Will Perform Tub/Shower Transfer: Tub transfer;with supervision;Ambulation;with DME ADL Goal: Tub/Shower Transfer - Progress: Goal set today Additional ADL Goal #1: Pt will be I with HEP for coordination and strength in prep for inc I with ADLs ADL Goal: Additional Goal #1 - Progress: Goal set today  Visit Information  Last OT Received On: 03/19/12 Assistance Needed: +1    Subjective Data  Subjective: I feel like I am doing quite a bit better today Patient Stated Goal: Return home   Prior Functioning  Home Living Lives With: Family Available Help at Discharge: Family;Available PRN/intermittently Type of Home: House Home Access: Stairs to enter [employer] of Steps: 11 Entrance Stairs-Rails: Can reach both Home Layout: One level Bathroom Shower/Tub: Tub/shower unit;Curtain Bathroom Toilet: Handicapped height Bathroom Accessibility: Yes How Accessible: Accessible via walker Home Adaptive Equipment: Walker - rolling;Straight cane;Bedside commode/3-in-1;Grab bars in shower;Shower chair with back;Walker - standard Prior Function Level of Independence: Independent;Independent with assistive device(s) Able to Take Stairs?: Reciprically Driving: No Vocation:  Retired Musician: No difficulties Dominant Hand: Right    Cognition  Overall Cognitive Status: Appears within functional limits for tasks assessed/performed Arousal/Alertness: Awake/alert Orientation Level: Appears intact for tasks assessed Behavior During Session: [name] for tasks performed Cognition - Other Comments: did not test higher level cog skills     Mobility   Bed Mobility Bed Mobility: Not assessed Supine to Sit: 4:  Min guard;With rails;HOB elevated Sitting - Scoot to Edge of Bed: 4: Min guard Details for Bed Mobility Assistance: needed rail and extra time Transfers Sit to Stand: 4: Min guard;With upper extremity assist;Without upper extremity assist Stand to Sit: 4: Min guard;With upper extremity assist Details for Transfer Assistance: Cues for safe hand placement and positioning for sitting       Exercises  Other Exercises Other Exercises: Pt  still reports RUE still isn't quite right. Had her stand at the dry erase board in the conference room and work on writing her name (pt has an upwards slant even though she has right shoulder flexion issues). Had her also practice wiping dry erase marker off of the board 1/2 with her right and 1/2 with her left hand (due to her saying her right arm was getting tired).  Pt using RUE equally as her right with washing her hands and combing her hair.   Balance Static Standing Balance Static Standing - Balance Support: No upper extremity supported Static Standing - Level of Assistance: 5: Stand by assistance Static Standing - Comment/# of Minutes:  for safety and balance while patient working on functional task with OT. Patient stood x 15 mins   End of Session OT - End of Session Equipment Utilized During Treatment: Gait belt Activity Tolerance: Patient tolerated treatment well Patient left: in chair;with call bell/phone within reach Nurse Communication: Mobility status       Evette Georges  [phone] 03/19/2012, 3:35 PM

## 2012-03-19 NOTE — H&P (Signed)
Physical Medicine and Rehabilitation Admission H&P  Chief Complaint   Patient presents with   .  Code stroke   :  HPI: [name] is a 78 y.o. right-handed female with history of diabetes mellitus with peripheral neuropathy, chronic kidney disease with baseline creatinine 2.3, PAF with pacemaker. Patient was independent prior to admission using a cane and sometimes a walker secondary to peripheral neuropathy and lives with her daughter who is a Publishing rights manager with a local cardiology group. Admitted 03/17/2012 with right-sided weakness and dizziness. Cranial CT scan was unremarkable for acute changes. She did receive TPA. MRI not completed secondary to pacemaker. Echocardiogram with normal systolic function and ejection fraction of 55%. Carotid Dopplers demonstrated 40-59% right ICA stenosis. EKG sinus tachycardia. Neurology services consulted for suspect left brain stroke and placed on Coumadin therapy with bridging of Lovenox. Subcutaneous Lovenox added for DVT prophylaxis. Patient remains on Keflex 250 mg daily for chronic urine tract infection. Currently on a dysphagia 1 family quit diet per speech therapy. Physical therapy evaluation completed. M.D. is requested physical medicine rehabilitation consult to consider inpatient rehabilitation services. Patient was felt to be dictated if her inpatient rehabilitation services and was admitted for comprehensive rehabilitation program  Review of Systems  Cardiovascular: Positive for palpitations.  Gastrointestinal: Positive for constipation.  Genitourinary: Positive for urgency.  Musculoskeletal: Positive for joint pain.  Neurological: Positive for dizziness.  Psychiatric/Behavioral:  Decreased short term memory  All other systems reviewed and are negative  Past Medical History   Diagnosis  Date   .  Diabetes mellitus, insulin dependent (IDDM), controlled    .  Chronic kidney disease, stage 4, severely decreased GFR      likely related to  diabetic and hypertensive nephropathy, no evidence of renal artery stenosis by duplex US in 2008   .  Mitral valve disorder    .  Pacemaker  November 2007     s/p placement complete heart block status   .  Near syncope  12/12/2011   .  Aortic stenosis, moderate to severe  12/12/2011   .  Chronic degenerative aortic valve disease  12/12/2011   .  PAF (paroxysmal atrial fibrillation)  12/12/2011   .  H/O hemolytic anemia due to drugs  12/12/2011   .  SSS (sick sinus syndrome) with medtronic pacemaker, placed 6 years ago, with micro dislodgement of atrial lead, though still paces--for CHB and paf  12/12/2011   .  UTI (lower urinary tract infection), curent and history of UTIs on Keflex daily  12/12/2011   .  CAD (coronary artery disease), cardiac cath  12/12/2011   .  Anemia, secondary to chronic disease  12/12/2011   .  Pacemaker complications      Known chronic dysfunction of atrial lead with high pacing trhesholds but good sensing.   .  Chronic combined systolic and diastolic CHF, NYHA class 2, in combination with AS, MR  12/15/2011   .  Bleeding of eye  retinal eye bleed    Past Surgical History   Procedure  Date   .  Pacemaker insertion  November 2007     Medtronic EnRhythm dual-chamber [id number]   .  Cardiac catheterization  2005     60% stenosis of 1st diagnoal branch, 60-70% stenosis of posterolateral brach of distal RCA, preserved LV with EF 50-55%, diastolic dysfunction   .  Eye surgery  retinal eye bleed   .  Abdominal hysterectomy     No family history on file.  Social History: reports that she quit smoking about 31 years ago. Her smoking use included Cigarettes. She has a 30 pack-year smoking history. She does not have any smokeless tobacco history on file. She reports that she does not drink alcohol or use illicit drugs.  Allergies:  Allergies   Allergen  Reactions   .  Floxin (Ofloxacin)  Other (See Comments)     nightmares   .  Sulfa Antibiotics  Other (See Comments)     From when younger    .  Darvocet (Propoxyphene-Acetaminophen)  Rash   .  Penicillins  Rash    Medications Prior to Admission   Medication  Sig  Dispense  Refill   .  acetaminophen (TYLENOL) 500 MG tablet  Take 1,000 mg by mouth 3 (three) times daily as needed. For pain     .  amiodarone (PACERONE) 200 MG tablet  Take 200 mg by mouth daily.     Marland Kitchen  aspirin EC 81 MG tablet  Take 81 mg by mouth daily.     .  calcitRIOL (ROCALTROL) 0.25 MCG capsule  Take 0.25 mcg by mouth every other day.     .  cephALEXin (KEFLEX) 250 MG capsule  Take 250 mg by mouth daily.     Marland Kitchen  docusate sodium (COLACE) 100 MG capsule  Take 200 mg by mouth daily.     .  Estradiol (VAGIFEM) 10 MCG TABS  Place 1 tablet vaginally once a week. Insert on Wednesdays.     Marland Kitchen  ezetimibe-simvastatin (VYTORIN) 10-10 MG per tablet  Take 1 tablet by mouth daily.     .  furosemide (LASIX) 40 MG tablet  Take 40 mg by mouth every morning.     .  insulin aspart (NOVOLOG) 100 UNIT/ML injection  Inject 8 Units into the skin 3 (three) times daily before meals. Per sliding scale.     .  insulin glargine (LANTUS) 100 UNIT/ML injection  Inject 16 Units into the skin every morning.     .  potassium chloride (K-DUR) 10 MEQ tablet  Take 10 mEq by mouth daily.     .  Probiotic Product (ALIGN) 4 MG CAPS  Take 1 capsule by mouth daily.     Marland Kitchen  zolpidem (AMBIEN) 5 MG tablet  Take 2.5 mg by mouth at bedtime. Patient takes another one-half tablet in the middle of night if she wakes up.      Home:  Home Living  Lives With: Family  Available Help at Discharge: Family;Available PRN/intermittently  Type of Home: House  Home Access: Stairs to enter  [employer] of Steps: 11  Entrance Stairs-Rails: Can reach both  Home Layout: One level  Bathroom Shower/Tub: Tub/shower unit;Curtain  Bathroom Toilet: Handicapped height  Bathroom Accessibility: Yes  How Accessible: Accessible via walker  Home Adaptive Equipment: Walker - rolling;Straight cane;Bedside  commode/3-in-1;Grab bars in shower;Shower chair with back;Walker - standard  Functional History:  Prior Function  Able to Take Stairs?: Reciprically  Driving: No  Vocation: Retired  Functional Status:  Mobility:  Bed Mobility  Bed Mobility: Supine to Sit;Sitting - Scoot to Edge of Bed  Supine to Sit: 4: Min guard;With rails;HOB elevated  Sitting - Scoot to Edge of Bed: 4: Min guard  Transfers  Transfers: Sit to Stand;Stand to Sit  Sit to Stand: 4: Min assist;With upper extremity assist;From bed  Stand to Sit: 4: Min assist;With upper extremity assist;To chair/3-in-1  Ambulation/Gait  Ambulation/Gait Assistance: 1: +2 Total assist;3: Mod assist  Ambulation/Gait: Patient Percentage: 60%  Ambulation Distance (Feet): 70 Feet  Assistive device: 1 person hand held assist;2 person hand held assist  Ambulation/Gait Assistance Details: mildly unsteady throughout; short scissored steps, narrow BOS. mild list R and pulling against assist on the Left  Gait Pattern: Step-through pattern;Decreased step length - right;Decreased step length - left;Decreased stride length;Scissoring;Narrow base of support  Stairs: No  Wheelchair Mobility  Wheelchair Mobility: No  ADL:  ADL  Grooming: Performed;Wash/dry face;Set up  Where Assessed - Grooming: Unsupported sitting  Lower Body Bathing: Simulated;Minimal assistance  Where Assessed - Lower Body Bathing: Supported standing  Upper Body Dressing: Simulated;Min guard  Where Assessed - Upper Body Dressing: Unsupported sitting  Lower Body Dressing: Performed;Min guard  Where Assessed - Lower Body Dressing: Unsupported sitting  Toilet Transfer: Simulated;Minimal assistance  Toilet Transfer Method: Sit to stand  Toilet Transfer Equipment: (bed to chair)  Equipment Used: Gait belt  Transfers/Ambulation Related to ADLs: Mod A with ambulation in room/hallways  Cognition:  Cognition  Overall Cognitive Status: Impaired  Arousal/Alertness: Awake/alert    Orientation Level: Oriented X4  Attention: Focused;Sustained;Selective;Alternating  Focused Attention: Appears intact  Sustained Attention: Appears intact  Selective Attention: Appears intact  Alternating Attention: Appears intact  Memory: Impaired  Memory Impairment: Decreased recall of new information;Other (comment) (mild baseline defcitis persist)  Awareness: Impaired  Awareness Impairment: Emergent impairment  Problem Solving: Appears intact  Cognition  Overall Cognitive Status: Appears within functional limits for tasks assessed/performed  Arousal/Alertness: Awake/alert  Orientation Level: Appears intact for tasks assessed  Behavior During Session: [facility] for tasks performed  Blood pressure 139/49, pulse 62, temperature 98.4 F (36.9 C), temperature source Oral, resp. rate 18, height 4\' 11"  (1.499 m), weight 64.6 kg (142 lb 6.7 oz), SpO2 100.00%.   Physical Exam  Vitals reviewed.  Constitutional: She appears well-developed. nad HENT: mucosa pink and moist Head: Normocephalic.  Eyes:  Pupils round and reactive to light  Neck: Neck supple. No thyromegaly present.  Cardiovascular:  Cardiac rate is controlled without murmur Pulmonary/Chest: Breath sounds normal. She has no wheezes.  Abdominal: Bowel sounds are normal. She exhibits no distension. There is no tenderness.  Neurological: She is alert.  Patient names person place and date of birth. She follows simple commands. She does have some decreased coordination and fine motor skills on the right. Right pronator dirft. RUE 4/5, RLE 4/5. Sensory exam grossly intact. Reasonable insight and awareness. Speech is generally clear. Cognitively with reasonable insight and awareness.  Skin: Skin is warm and dry.  Psychiatric: She has a normal mood and affect. Her behavior is normal. Judgment and thought content normal  Results for orders placed during the hospital encounter of 03/17/12 (from the past 48 hour(s))   GLUCOSE, CAPILLARY  Status: Abnormal    Collection Time    03/17/12 12:28 PM   Component  Value  Range  Comment    Glucose-Capillary  226 (*)  70 - 99 mg/dL    PROTIME-INR Status: Normal    Collection Time    03/17/12 12:40 PM   Component  Value  Range  Comment    Prothrombin Time  12.7  11.6 - 15.2 seconds     INR  0.96  0.00 - 1.49    APTT Status: Normal    Collection Time    03/17/12 12:40 PM   Component  Value  Range  Comment    aPTT  31  24 - 37 seconds    CBC Status: Abnormal  Collection Time    03/17/12 12:40 PM   Component  Value  Range  Comment    WBC  6.7  4.0 - 10.5 K/uL     RBC  3.34 (*)  3.87 - 5.11 MIL/uL     Hemoglobin  10.1 (*)  12.0 - 15.0 g/dL     HCT  08.6 (*)  57.8 - 46.0 %     MCV  96.7  78.0 - 100.0 fL     MCH  30.2  26.0 - 34.0 pg     MCHC  31.3  30.0 - 36.0 g/dL     RDW  46.9  62.9 - 52.8 %     Platelets  247  150 - 400 K/uL    DIFFERENTIAL Status: Normal    Collection Time    03/17/12 12:40 PM   Component  Value  Range  Comment    Neutrophils Relative  62  43 - 77 %     Neutro Abs  4.2  1.7 - 7.7 K/uL     Lymphocytes Relative  25  12 - 46 %     Lymphs Abs  1.7  0.7 - 4.0 K/uL     Monocytes Relative  8  3 - 12 %     Monocytes Absolute  0.6  0.1 - 1.0 K/uL     Eosinophils Relative  4  0 - 5 %     Eosinophils Absolute  0.3  0.0 - 0.7 K/uL     Basophils Relative  0  0 - 1 %     Basophils Absolute  0.0  0.0 - 0.1 K/uL    COMPREHENSIVE METABOLIC PANEL Status: Abnormal    Collection Time    03/17/12 12:40 PM   Component  Value  Range  Comment    Sodium  135  135 - 145 mEq/L     Potassium  4.1  3.5 - 5.1 mEq/L     Chloride  97  96 - 112 mEq/L     CO2  25  19 - 32 mEq/L     Glucose, Bld  240 (*)  70 - 99 mg/dL     BUN  55 (*)  6 - 23 mg/dL     Creatinine, Ser  4.13 (*)  0.50 - 1.10 mg/dL     Calcium  24.4  8.4 - 10.5 mg/dL     Total Protein  6.8  6.0 - 8.3 g/dL     Albumin  3.5  3.5 - 5.2 g/dL     AST  20  0 - 37 U/L     ALT  13  0 - 35 U/L     Alkaline  Phosphatase  98  39 - 117 U/L     Total Bilirubin  0.3  0.3 - 1.2 mg/dL     GFR calc non Af Amer  18 (*)  >90 mL/min     GFR calc Af Amer  20 (*)  >90 mL/min    TROPONIN I Status: Normal    Collection Time    03/17/12 12:40 PM   Component  Value  Range  Comment    Troponin I  <0.30  <0.30 ng/mL    POCT I-STAT TROPONIN I Status: Normal    Collection Time    03/17/12 12:42 PM   Component  Value  Range  Comment    Troponin i, poc  0.02  0.00 - 0.08 ng/mL     Comment 3  POCT I-STAT, CHEM 8 Status: Abnormal    Collection Time    03/17/12 12:43 PM   Component  Value  Range  Comment    Sodium  137  135 - 145 mEq/L     Potassium  4.2  3.5 - 5.1 mEq/L     Chloride  101  96 - 112 mEq/L     BUN  55 (*)  6 - 23 mg/dL     Creatinine, Ser  1.61 (*)  0.50 - 1.10 mg/dL     Glucose, Bld  096 (*)  70 - 99 mg/dL     Calcium, Ion  0.45  1.13 - 1.30 mmol/L     TCO2  25  0 - 100 mmol/L     Hemoglobin  10.5 (*)  12.0 - 15.0 g/dL     HCT  40.9 (*)  81.1 - 46.0 %    MRSA PCR SCREENING Status: Normal    Collection Time    03/17/12 10:30 PM   Component  Value  Range  Comment    MRSA by PCR  NEGATIVE  NEGATIVE    HEMOGLOBIN A1C Status: Abnormal    Collection Time    03/18/12 5:00 AM   Component  Value  Range  Comment    Hemoglobin A1C  6.1 (*)  <5.7 %     Mean Plasma Glucose  128 (*)  <117 mg/dL    LIPID PANEL Status: Normal    Collection Time    03/18/12 5:00 AM   Component  Value  Range  Comment    Cholesterol  157  0 - 200 mg/dL     Triglycerides  914  <150 mg/dL     HDL  62  >78 mg/dL     Total CHOL/HDL Ratio  2.5      VLDL  29  0 - 40 mg/dL     LDL Cholesterol  66  0 - 99 mg/dL    GLUCOSE, CAPILLARY Status: Abnormal    Collection Time    03/18/12 7:55 AM   Component  Value  Range  Comment    Glucose-Capillary  104 (*)  70 - 99 mg/dL    GLUCOSE, CAPILLARY Status: Abnormal    Collection Time    03/18/12 5:04 PM   Component  Value  Range  Comment    Glucose-Capillary  142 (*)  70  - 99 mg/dL    GLUCOSE, CAPILLARY Status: Abnormal    Collection Time    03/18/12 10:15 PM   Component  Value  Range  Comment    Glucose-Capillary  178 (*)  70 - 99 mg/dL    PROTIME-INR Status: Normal    Collection Time    03/19/12 5:50 AM   Component  Value  Range  Comment    Prothrombin Time  14.2  11.6 - 15.2 seconds     INR  1.11  0.00 - 1.49    CBC Status: Abnormal    Collection Time    03/19/12 5:50 AM   Component  Value  Range  Comment    WBC  8.8  4.0 - 10.5 K/uL     RBC  2.94 (*)  3.87 - 5.11 MIL/uL     Hemoglobin  8.8 (*)  12.0 - 15.0 g/dL     HCT  29.5 (*)  62.1 - 46.0 %     MCV  94.9  78.0 - 100.0 fL     MCH  29.9  26.0 -  34.0 pg     MCHC  31.5  30.0 - 36.0 g/dL     RDW  86.5  78.4 - 69.6 %     Platelets  243  150 - 400 K/uL    GLUCOSE, CAPILLARY Status: Abnormal    Collection Time    03/19/12 6:35 AM   Component  Value  Range  Comment    Glucose-Capillary  145 (*)  70 - 99 mg/dL     Dg Chest 2 View  29/52/8413 *RADIOLOGY REPORT* Clinical Data: Shortness of breath. Stroke. CHEST - 2 VIEW Comparison: 12/13/2011 Findings: The heart size and vascularity are normal and the lungs are clear. The multiple old healed right rib fractures. No effusions or infiltrates. Dual lead pacer in place. IMPRESSION: No acute abnormality. Original Report Authenticated By: Gwynn Burly, M.D.  Ct Head Wo Contrast  03/18/2012 *RADIOLOGY REPORT* Clinical Data: Right arm weakness post t-PA. CT HEAD WITHOUT CONTRAST Technique: Contiguous axial images were obtained from the base of the skull through the vertex without contrast. Comparison: 03/17/2012. Findings: No intracranial hemorrhage. Prominent small vessel disease type changes limit evaluation for development of an acute small infarct. No CT evidence of large acute infarct. Remote right caudate head infarct. Global atrophy without hydrocephalus. No intracranial mass lesion detected on this unenhanced exam. Vascular calcifications.  IMPRESSION: No intracranial hemorrhage or CT evidence of large acute infarct. Prominent small vessel disease type changes and remote right caudate head infarct. Global atrophy. Original Report Authenticated By: Fuller Canada, M.D.  Ct Head Wo Contrast  03/17/2012 *RADIOLOGY REPORT* Clinical Data: Code stroke. Acute onset right arm weakness. CT HEAD WITHOUT CONTRAST Technique: Contiguous axial images were obtained from the base of the skull through the vertex without contrast. Comparison: None. Findings: There is no evidence of intracranial hemorrhage, brain edema or other signs of acute infarction. There is no evidence of intracranial mass lesion or mass effect. No abnormal extra-axial fluid collections are identified. Moderate diffuse cerebral atrophy is seen. Extensive chronic white matter disease is also demonstrated. Old right caudate lacunar infarct is noted. No evidence of hydrocephalus. No skull fracture or other bone lesions identified. IMPRESSION: 1. No acute intracranial abnormality. 2. Diffuse cerebral atrophy and extensive chronic small vessel disease. Old right basal ganglia lacunar infarct. These results were called by telephone on 03/17/2012 at 1245 hours to Dr. Radford Pax in the emergency department, who verbally acknowledged these results. Original Report Authenticated By: Danae Orleans, M.D.   Post Admission Physician Evaluation:  1. Functional deficits secondary to subcortical left brain infarct. 2. Patient is admitted to receive collaborative, interdisciplinary care between the physiatrist, rehab nursing staff, and therapy team. 3. Patient's level of medical complexity and substantial therapy needs in context of that medical necessity cannot be provided at a lesser intensity of care such as a SNF. 4. Patient has experienced substantial functional loss from his/her baseline which was documented above under the "Functional History" and "Functional Status" headings. Judging by the patient's  diagnosis, physical exam, and functional history, the patient has potential for functional progress which will result in measurable gains while on inpatient rehab. These gains will be of substantial and practical use upon discharge in facilitating mobility and self-care at the household level. 5. Physiatrist will provide 24 hour management of medical needs as well as oversight of the therapy plan/treatment and provide guidance as appropriate regarding the interaction of the two. 6. 24 hour rehab nursing will assist with bladder management, bowel management, safety, skin/wound care, disease  management, medication administration, pain management and patient education and help integrate therapy concepts, techniques,education, etc. 7. PT will assess and treat for: fxnl mobility, NMR, safety, adaptive equipment, . Goals are: mod I. 8. OT will assess and treat for: fxnl mobility, ADl'S, SAFETY, adaptive technique and education. Goals are: mod I. 9. SLP will assess and treat for: n/a. Goals are: n/a. 10. Case Management and Social Worker will assess and treat for psychological issues and discharge planning. 11. Team conference will be held weekly to assess progress toward goals and to determine barriers to discharge. 12. Patient will receive at least 3 hours of therapy per day at least 5 days per week. 13. ELOS: 7-10 days Prognosis: excellent Medical Problem List and Plan:  1. Subcortical left brain infarcts  2. DVT Prophylaxis/Anticoagulation: Chronic Coumadin therapy with Lovenox for bridging until INR greater than 2.00  3. Pain Management: Ultram 50 mg every 12 hours as needed, Lidoderm patch. Monitor with increased mobility  4. Neuropsych: This patient is capable of making decisions on his/her own behalf.  5. PAF/pacemaker. Amiodarone 200 mg daily. Cardiac rate control  6. Chronic kidney disease. Baseline creatinine 2.3. Continue Lasix 40 mg daily as well as Calcitrol 0.25 mcg every other day. Followup  chemistries  7. Diabetes mellitus with peripheral neuropathy. Hemoglobin A1c 6.1. Continue Lantus insulin 16 units daily and NovoLog 8 units 3 times a day. Check blood sugars a.c. and at bedtime  8. CHF. Continue Lasix 40 mg daily and monitor for any signs of fluid overload  9. Chronic urinary tract infection. Keflex 200 mg daily prophylactically  10. Hyperlipidemia. Zetia/Zocor  11. Anemia of chronic disease. Latest hemoglobin 8.8. Monitor for any bleeding episodes and followup CBC   Ivory Broad, MD

## 2012-03-19 NOTE — Progress Notes (Signed)
Stroke Team Progress Note  HISTORY Susan Bleich is an 78 y.o. female who was getting dressed 03/17/2012. She acutely became dizzy and when she went to grab the bedrail she noted that her right arm was weak. Patient was brought in by private vehicle. Code stroke was called. Initial CT was unremarkable. She received TPA. She was admitted to the neuro ICU for further evaluation and treatment.  SUBJECTIVE Her daughter, Vernona Rieger (NP for [facility]) is at the bedside. I spoke to her on the phone.  Overall she feels her mothers condition is gradually improving as does the patient, although she is still generally weak.   OBJECTIVE Most recent Vital Signs: Filed Vitals:   03/18/12 1848 03/18/12 2145 03/19/12 0109 03/19/12 0540  BP: 166/62 133/51 136/60 139/49  Pulse: 68 60 64 62  Temp: 98 F (36.7 C) 98.9 F (37.2 C) 98.1 F (36.7 C) 98.4 F (36.9 C)  TempSrc: Oral Oral Oral Oral  Resp: 20 18 18 18   Height:      Weight:      SpO2: 98% 100% 94% 100%   CBG (last 3)   Basename 03/19/12 0635 03/18/12 2215 03/18/12 1704  GLUCAP 145* 178* 142*    IV Fluid Intake:      . DISCONTD: sodium chloride 50 mL/hr at 03/17/12 1839    MEDICATIONS     . amiodarone  200 mg Oral Daily  . aspirin EC  81 mg Oral Daily  . calcitRIOL  0.25 mcg Oral QODAY  . cephALEXin  250 mg Oral Daily  . coumadin book  1 each Does not apply Once  . docusate sodium  200 mg Oral Daily  . enoxaparin (LOVENOX) injection  40 mg Subcutaneous Q24H  . ezetimibe  10 mg Oral QPC supper  . furosemide  40 mg Oral q morning - 10a  . influenza  inactive virus vaccine  0.5 mL Intramuscular Tomorrow-1000  . insulin aspart  8 Units Subcutaneous TID AC  . insulin glargine  16 Units Subcutaneous q morning - 10a  . lidocaine  1 patch Transdermal Q24H  . pneumococcal 23 valent vaccine  0.5 mL Intramuscular Tomorrow-1000  . potassium chloride  10 mEq Oral Daily  . simvastatin  10 mg Oral q1800  . warfarin  2.5 mg Oral  Once  . warfarin  1 each Does not apply Once  . Warfarin - Pharmacist Dosing Inpatient   Does not apply q1800  . zolpidem  2.5 mg Oral QHS  . DISCONTD: ezetimibe-simvastatin  1 tablet Oral Daily  . DISCONTD: pantoprazole (PROTONIX) IV  40 mg Intravenous QHS   PRN:  labetalol, senna-docusate, traMADol, DISCONTD: ondansetron (ZOFRAN) IV  Diet:  Dysphagia 1-thin Activity:  Activity as tolerated DVT Prophylaxis:  SCDs   CLINICALLY SIGNIFICANT STUDIES Basic Metabolic Panel:   Lab 03/17/12 1243 03/17/12 1240  NA 137 135  K 4.2 4.1  CL 101 97  CO2 -- 25  GLUCOSE 228* 240*  BUN 55* 55*  CREATININE 2.30* 2.37*  CALCIUM -- 10.2  MG -- --  PHOS -- --   Liver Function Tests:   Lab 03/17/12 1240  AST 20  ALT 13  ALKPHOS 98  BILITOT 0.3  PROT 6.8  ALBUMIN 3.5   CBC:   Lab 03/19/12 0550 03/17/12 1243 03/17/12 1240  WBC 8.8 -- 6.7  NEUTROABS -- -- 4.2  HGB 8.8* 10.5* --  HCT 27.9* 31.0* --  MCV 94.9 -- 96.7  PLT 243 -- 247   Coagulation:  Lab 03/19/12 0550 03/17/12 1240  LABPROT 14.2 12.7  INR 1.11 0.96   Cardiac Enzymes:   Lab 03/17/12 1240  CKTOTAL --  CKMB --  CKMBINDEX --  TROPONINI <0.30   Lipid Panel    Component Value Date/Time   CHOL 157 03/18/2012 0500   TRIG 146 03/18/2012 0500   HDL 62 03/18/2012 0500   CHOLHDL 2.5 03/18/2012 0500   VLDL 29 03/18/2012 0500   LDLCALC 66 03/18/2012 0500   HgbA1C  Lab Results  Component Value Date   HGBA1C 6.1* 03/18/2012   CT of the brain  03/17/2012    1.  No acute intracranial abnormality. 2.  Diffuse cerebral atrophy and extensive chronic small vessel disease.  Old right basal ganglia lacunar infarct.  CT Brain 03/17/2012 No intracranial hemorrhage or CT evidence of large acute infarct. Prominent small vessel disease type changes and remote right caudate head infarct  MRI of the brain  pacer  MRA of the brain  pacer  2D Echocardiogram  EF 50-55%, Mod-severe AS (valve area 0.89cm^2), LA dilated, right  ventricular pacing  Carotid Doppler There is 40-59% right ICA stenosis, mid range of scale. There is no left ICA stenosis. Bilateral vertebral artery flow is antegrade.   Transcranial Dopplers  CXR  No acute abnormality  EKG  sinus tachycardia.   Therapy Recommendations PT - ; OT - ; ST - CIR consulted  Physical Exam  Pleasant obese elderly Caucasian lady currently not in distress.Awake alert. Afebrile. Head is nontraumatic. Neck is supple without bruit. Hearing is normal. Cardiac exam no murmur or gallop. Lungs are clear to auscultation. Distal pulses are well felt.   Neurological Exam : awake alert oriented to place and person. Follows simple one and q.2 step commands. Diminished attention, short-term memory, registration and recall. No aphasia apraxia or dysarthria. Speech is likely hesitant and nonfluent. Extraocular moments are full range without nystagmus. She blinks to threat bilaterally. Fundi were not visualized. Vision acuity and fields appear normal. And mild right lower facial symmetry. Tongue is midline. Cough and gag appear adequate. Motor system exam reveals no upper or lower expected drift but fine finger movements are diminished on the right. Right grip is weaker than the left. She orbits left over right upper extremity. There is minimum right finger-to-nose dysmetria. Lower extremity strength is symmetric. Deep tendon reflexes are 1+ symmetric. Plantars are downgoing. Touch and pinprick sensation appear preserved bilaterally. Gait was not tested.  ASSESSMENT Ms. Keilany Burnette is a 78 y.o. female presenting with dizziness, right arm weakness. Status post IV t-PA 03/17/2012 at 1325. Left brain stroke suspected. Work up underway. On aspirin 81 mg orally every day prior to admission. Now on no antiplatelets secondary to within 24h period post tPA for secondary stroke prevention. Patient with resultant right arm hemiparesis.  atrial fibrillation Diabetes, 6.1 Hyperlipidemia, LDL 66,  on statin PTA, goal LDL < 70 Chronic kidney disease, stage 4 SSS with Pacemaker Aortic stenosis, moderate to severe Chronic UTI, on keflex Chronic anemia due to renal disease Paroxsymal atrial fibrillation  Hospital day # 2  TREATMENT/PLAN  Recommend anticoagulation for secondary stroke prevention. No MRI secondary to pacer. No CTA secondary to creatinine. Currently on baby aspirin 81mg  daily + coumadin load. Stop aspirin once INR > 2.0  CIR candidate  TCD pending  Risk factor modification, goal for ultimate BP as outpatient 130/80.  Discussed with patient and daughter and answered questions  Medically ready for discharge to rehab once bed available  Followup  with dr. Pearlean Brownie in 2 mos      Guy Franco, [facility],  MBA, MHA [facility] Pager: [phone] 03/19/2012 9:23 AM  Scribe for Dr. Delia Heady, Stroke Center Medical Director. He has personally reviewed chart, pertinent data, examined the patient and developed the plan of care. Pager:  [phone]

## 2012-03-19 NOTE — Progress Notes (Signed)
VASCULAR LAB   TCD completed.      Carmichael Burdette, RVT 03/19/2012, 12:42 PM

## 2012-03-19 NOTE — Progress Notes (Signed)
Stroke Discharge Summary  Patient ID: [name]   MRN: [id number]      DOB: [birth year]  Date of Admission: 03/17/2012 Date of Discharge: 03/19/2012  Attending Physician:  Darcella Cheshire, MD, Stroke MD  Consulting Physician(s):  Treatment Team:  Md Stroke, MD rehabilitation medicine Dr.Swartz  Patient's PCP:  Ginette Otto, MD  Discharge Diagnoses:  Principal Problem:  *Cerebral embolism with cerebral infarction Active Problems:  PAF (paroxysmal atrial fibrillation)  Diabetes mellitus, insulin dependent (IDDM), controlled  UTI (lower urinary tract infection), curent and history of UTIs on Keflex daily  CKD (chronic kidney disease) stage 5, GFR less than 15 ml/min  Hemiplegia, unspecified, affecting dominant side  Hyperlipidemia LDL goal < 70  BMI  Body mass index is 28.76 kg/(m^2).  Past Medical History  Diagnosis Date  . Diabetes mellitus, insulin dependent (IDDM), controlled   . Chronic kidney disease, stage 4, severely decreased GFR     likely related to diabetic and hypertensive nephropathy, no evidence of renal artery stenosis by duplex US in 2008  . Mitral valve disorder   . Pacemaker November 2007    s/p placement complete heart block status  . Near syncope 12/12/2011  . Aortic stenosis, moderate to severe 12/12/2011  . Chronic degenerative aortic valve disease 12/12/2011  . PAF (paroxysmal atrial fibrillation) 12/12/2011  . H/O hemolytic anemia due to drugs 12/12/2011  . SSS (sick sinus syndrome) with medtronic pacemaker, placed 6 years ago, with micro dislodgement of atrial lead, though still paces--for CHB and paf 12/12/2011  . UTI (lower urinary tract infection), curent and history of UTIs on Keflex daily 12/12/2011  . CAD (coronary artery disease), cardiac cath  12/12/2011  . Anemia, secondary to chronic disease 12/12/2011  . Pacemaker complications     Known chronic dysfunction of atrial lead with high pacing trhesholds but good sensing.  . Chronic combined  systolic and diastolic CHF, NYHA class 2, in combination with AS, MR 12/15/2011  . Bleeding of eye retinal eye bleed   Past Surgical History  Procedure Date  . Pacemaker insertion November 2007    Medtronic EnRhythm dual-chamber [id number]  . Cardiac catheterization 2005    60% stenosis of 1st diagnoal branch, 60-70% stenosis of posterolateral brach of distal RCA, preserved LV with EF 50-55%, diastolic dysfunction  . Eye surgery retinal eye bleed  . Abdominal hysterectomy     Medications to be continued on Rehab    . amiodarone  200 mg Oral Daily  . aspirin EC  81 mg Oral Daily  . calcitRIOL  0.25 mcg Oral QODAY  . cephALEXin  250 mg Oral Daily  . coumadin book  1 each Does not apply Once  . docusate sodium  200 mg Oral Daily  . enoxaparin (LOVENOX) injection  40 mg Subcutaneous Q24H  . ezetimibe  10 mg Oral QPC supper  . furosemide  40 mg Oral q morning - 10a  . influenza  inactive virus vaccine  0.5 mL Intramuscular Tomorrow-1000  . insulin aspart  8 Units Subcutaneous TID AC  . insulin glargine  16 Units Subcutaneous q morning - 10a  . lidocaine  1 patch Transdermal Q24H  . pneumococcal 23 valent vaccine  0.5 mL Intramuscular Tomorrow-1000  . potassium chloride  10 mEq Oral Daily  . simvastatin  10 mg Oral q1800  . warfarin  2.5 mg Oral Once  . warfarin  2.5 mg Oral ONCE-1800  . warfarin  1 each Does not apply Once  .  Warfarin - Pharmacist Dosing Inpatient   Does not apply q1800  . zolpidem  2.5 mg Oral QHS  . DISCONTD: ezetimibe-simvastatin  1 tablet Oral Daily  . DISCONTD: pantoprazole (PROTONIX) IV  40 mg Intravenous QHS    LABORATORY STUDIES CBC    Component Value Date/Time   WBC 8.8 03/19/2012 0550   RBC 2.94* 03/19/2012 0550   HGB 8.8* 03/19/2012 0550   HCT 27.9* 03/19/2012 0550   PLT 243 03/19/2012 0550   MCV 94.9 03/19/2012 0550   MCH 29.9 03/19/2012 0550   MCHC 31.5 03/19/2012 0550   RDW 13.9 03/19/2012 0550   LYMPHSABS 1.7 03/17/2012 1240   MONOABS 0.6  03/17/2012 1240   EOSABS 0.3 03/17/2012 1240   BASOSABS 0.0 03/17/2012 1240   CMP    Component Value Date/Time   NA 137 03/17/2012 1243   K 4.2 03/17/2012 1243   CL 101 03/17/2012 1243   CO2 25 03/17/2012 1240   GLUCOSE 228* 03/17/2012 1243   BUN 55* 03/17/2012 1243   CREATININE 2.30* 03/17/2012 1243   CALCIUM 10.2 03/17/2012 1240   PROT 6.8 03/17/2012 1240   ALBUMIN 3.5 03/17/2012 1240   AST 20 03/17/2012 1240   ALT 13 03/17/2012 1240   ALKPHOS 98 03/17/2012 1240   BILITOT 0.3 03/17/2012 1240   GFRNONAA 18* 03/17/2012 1240   GFRAA 20* 03/17/2012 1240   COAGS Lab Results  Component Value Date   INR 1.11 03/19/2012   INR 0.96 03/17/2012   Lipid Panel    Component Value Date/Time   CHOL 157 03/18/2012 0500   TRIG 146 03/18/2012 0500   HDL 62 03/18/2012 0500   CHOLHDL 2.5 03/18/2012 0500   VLDL 29 03/18/2012 0500   LDLCALC 66 03/18/2012 0500   HgbA1C  Lab Results  Component Value Date   HGBA1C 6.1* 03/18/2012   Cardiac Panel (last 3 results)  Basename 03/17/12 1240  CKTOTAL --  CKMB --  TROPONINI <0.30  RELINDX --   Urinalysis    Component Value Date/Time   COLORURINE YELLOW 12/12/2011 1323   APPEARANCEUR CLOUDY* 12/12/2011 1323   LABSPEC 1.012 12/12/2011 1323   PHURINE 6.0 12/12/2011 1323   GLUCOSEU NEGATIVE 12/12/2011 1323   HGBUR TRACE* 12/12/2011 1323   BILIRUBINUR NEGATIVE 12/12/2011 1323   KETONESUR NEGATIVE 12/12/2011 1323   PROTEINUR 30* 12/12/2011 1323   UROBILINOGEN 0.2 12/12/2011 1323   NITRITE NEGATIVE 12/12/2011 1323   LEUKOCYTESUR LARGE* 12/12/2011 1323   SIGNIFICANT DIAGNOSTIC STUDIES  CT of the brain 03/17/2012 1. No acute intracranial abnormality. 2. Diffuse cerebral atrophy and extensive chronic small vessel disease. Old right basal ganglia lacunar infarct.   CT Brain 03/17/2012 No intracranial hemorrhage or CT evidence of large acute infarct. Prominent small vessel disease type changes and remote right caudate head infarct   MRI of the brain pacer    MRA of the brain pacer   2D Echocardiogram EF 50-55%, Mod-severe AS (valve area 0.89cm^2), LA dilated, right ventricular pacing   Carotid Doppler There is 40-59% right ICA stenosis, mid range of scale. There is no left ICA stenosis. Bilateral vertebral artery flow is antegrade.   Transcranial Dopplers   CXR No acute abnormality   EKG sinus tachycardia   History of Present Illness    [name] is an 78 y.o. female who was getting dressed 03/17/2012. She acutely became dizzy and when she went to grab the bedrail she noted that her right arm was weak. Patient was brought in by private vehicle. Code stroke  was called. NIHSS=2. Initial CT was unremarkable. She received TPA. She was admitted to the neuro ICU for further evaluation and treatment.  Hospital Course   She was admitted to neuro ICU. She was unable to get followup with MRI due to pacemaker, but followup CT did not show large infarct or hemorrhage. Patient with resultant right arm hemiparesis. Recommend anticoagulation for secondary stroke prevention. No MRI secondary to pacer. No CTA secondary to creatinine. Currently on baby aspirin 81mg  daily + coumadin load. Stop aspirin once INR > 2.0.  TCD has been ordered but are remaining pending.   Patient has Stroke risk factors of atrial fibrillation, diabetes mellitus, hyperlipidemia and hypertension  Physical therapy, occupational therapy and speech therapy evaluated patient. All agreed inpatient rehab is needed. Patient's daughter (with whom she lives with is supportive and can provide care at discharge. CIR bed is available today and patient will be transferred there.  Discharge Exam  Blood pressure 147/47, pulse 59, temperature 98 F (36.7 C), temperature source Oral, resp. rate 20, height 4\' 11"  (1.499 m), weight 64.6 kg (142 lb 6.7 oz), SpO2 99.00%.   Physical Exam Pleasant obese elderly Caucasian lady currently not in distress.Awake alert. Afebrile. Head is nontraumatic.  Neck is supple without bruit. Hearing is normal. Cardiac exam no murmur or gallop. Lungs are clear to auscultation. Distal pulses are well felt.  Neurological Exam : awake alert oriented to place and person. Follows simple one and q.2 step commands. Diminished attention, short-term memory, registration and recall. No aphasia apraxia or dysarthria. Speech is likely hesitant and nonfluent. Extraocular moments are full range without nystagmus. She blinks to threat bilaterally. Fundi were not visualized. Vision acuity and fields appear normal. And mild right lower facial symmetry. Tongue is midline. Cough and gag appear adequate. Motor system exam reveals no upper or lower expected drift but fine finger movements are diminished on the right. Right grip is weaker than the left. She orbits left over right upper extremity. There is minimum right finger-to-nose dysmetria. Lower extremity strength is symmetric. Deep tendon reflexes are 1+ symmetric. Plantars are downgoing. Touch and pinprick sensation appear preserved bilaterally. Gait was not tested.   Discharge Diet  Dysphagia thin liquids  Discharge Plan  Disposition:  Transfer to [facility] for ongoing PT, OT and ST  aspirin 81 mg orally every day and coumadin load for secondary stroke prevention. Once INR >2.0, stop baby aspirin.  Ongoing risk factor control by Primary Care Physician. Risk factor recommendations:  Hypertension target range 130-140/70-80 Lipid range - LDL < 100 and checked every 6 months, fasting Diabetes - HgB A1C <7   Follow-up Ginette Otto, MD in 1 month.  Follow-up with Dr. Delia Heady in 2 months.  Signed  Guy Franco, PAC,  MBA, MHA [facility] Pager: [phone] 03/19/2012 11:56 AM  Scribe for Dr. Delia Heady, Stroke Center Medical Director. He has personally reviewed chart, pertinent data, examined the patient and developed the plan of care. Pager:  [phone]

## 2012-03-19 NOTE — Progress Notes (Signed)
Patient information reviewed and entered into eRehab system by Burt Piatek, RN, CRRN, PPS Coordinator.  Information including medical coding and functional independence measure will be reviewed and updated through discharge.     Per nursing patient was given "Data Collection Information Summary for Patients in Inpatient Rehabilitation Facilities with attached "Privacy Act Statement-Health Care Records" upon admission.  

## 2012-03-19 NOTE — Consult Note (Signed)
Physical Medicine and Rehabilitation Consult Reason for Consult: CVA Referring Physician: Dr. Pearlean Brownie   HPI: [name] is a 78 y.o. right-handed female with history of diabetes mellitus with peripheral neuropathy, chronic kidney disease with baseline creatinine 2.3, PAF with pacemaker. Patient was independent prior to admission using a cane and sometimes a walker secondary to peripheral neuropathy and lives with her daughter who is a Publishing rights manager with a local cardiology group. Admitted 03/17/2012 with right-sided weakness and dizziness. Cranial CT scan was unremarkable for acute changes. She did receive TPA. MRI not completed secondary to pacemaker. Echocardiogram with normal systolic function and ejection fraction of 55%. Carotid Dopplers are pending. EKG sinus tachycardia. Neurology services consulted for suspect left brain stroke and placed on Coumadin therapy with bridging of Lovenox. Subcutaneous Lovenox added for DVT prophylaxis. Patient remains on  Keflex 250 mg daily for chronic urine tract infection. Currently on a dysphagia 1 family quit diet per speech therapy. Physical therapy evaluation completed. M.D. is requested physical medicine rehabilitation consult to consider inpatient rehabilitation services   Review of Systems  Cardiovascular: Positive for palpitations.  Gastrointestinal: Positive for constipation.  Genitourinary: Positive for urgency.  Musculoskeletal: Positive for joint pain.  Neurological: Positive for dizziness.  Psychiatric/Behavioral:       Decreased short term memory  All other systems reviewed and are negative.   Past Medical History  Diagnosis Date  . Diabetes mellitus, insulin dependent (IDDM), controlled   . Chronic kidney disease, stage 4, severely decreased GFR     likely related to diabetic and hypertensive nephropathy, no evidence of renal artery stenosis by duplex US in 2008  . Mitral valve disorder   . Pacemaker November 2007    s/p placement  complete heart block status  . Near syncope 12/12/2011  . Aortic stenosis, moderate to severe 12/12/2011  . Chronic degenerative aortic valve disease 12/12/2011  . PAF (paroxysmal atrial fibrillation) 12/12/2011  . H/O hemolytic anemia due to drugs 12/12/2011  . SSS (sick sinus syndrome) with medtronic pacemaker, placed 6 years ago, with micro dislodgement of atrial lead, though still paces--for CHB and paf 12/12/2011  . UTI (lower urinary tract infection), curent and history of UTIs on Keflex daily 12/12/2011  . CAD (coronary artery disease), cardiac cath  12/12/2011  . Anemia, secondary to chronic disease 12/12/2011  . Pacemaker complications     Known chronic dysfunction of atrial lead with high pacing trhesholds but good sensing.  . Chronic combined systolic and diastolic CHF, NYHA class 2, in combination with AS, MR 12/15/2011  . Bleeding of eye retinal eye bleed   Past Surgical History  Procedure Date  . Pacemaker insertion November 2007    Medtronic EnRhythm dual-chamber [id number]  . Cardiac catheterization 2005    60% stenosis of 1st diagnoal branch, 60-70% stenosis of posterolateral brach of distal RCA, preserved LV with EF 50-55%, diastolic dysfunction  . Eye surgery retinal eye bleed  . Abdominal hysterectomy    No family history on file. Social History:  reports that she quit smoking about 31 years ago. Her smoking use included Cigarettes. She has a 30 pack-year smoking history. She does not have any smokeless tobacco history on file. She reports that she does not drink alcohol or use illicit drugs. Allergies:  Allergies  Allergen Reactions  . Floxin (Ofloxacin) Other (See Comments)    nightmares  . Sulfa Antibiotics Other (See Comments)    From when younger  . Darvocet (Propoxyphene-Acetaminophen) Rash  . Penicillins Rash   Medications  Prior to Admission  Medication Sig Dispense Refill  . acetaminophen (TYLENOL) 500 MG tablet Take 1,000 mg by mouth 3 (three) times daily as needed. For  pain      . amiodarone (PACERONE) 200 MG tablet Take 200 mg by mouth daily.       Marland Kitchen aspirin EC 81 MG tablet Take 81 mg by mouth daily.      . calcitRIOL (ROCALTROL) 0.25 MCG capsule Take 0.25 mcg by mouth every other day.       . cephALEXin (KEFLEX) 250 MG capsule Take 250 mg by mouth daily.      Marland Kitchen docusate sodium (COLACE) 100 MG capsule Take 200 mg by mouth daily.      . Estradiol (VAGIFEM) 10 MCG TABS Place 1 tablet vaginally once a week. Insert on Wednesdays.      Marland Kitchen ezetimibe-simvastatin (VYTORIN) 10-10 MG per tablet Take 1 tablet by mouth daily.      . furosemide (LASIX) 40 MG tablet Take 40 mg by mouth every morning.      . insulin aspart (NOVOLOG) 100 UNIT/ML injection Inject 8 Units into the skin 3 (three) times daily before meals. Per sliding scale.      . insulin glargine (LANTUS) 100 UNIT/ML injection Inject 16 Units into the skin every morning.       . potassium chloride (K-DUR) 10 MEQ tablet Take 10 mEq by mouth daily.      . Probiotic Product (ALIGN) 4 MG CAPS Take 1 capsule by mouth daily.      Marland Kitchen zolpidem (AMBIEN) 5 MG tablet Take 2.5 mg by mouth at bedtime. Patient takes another one-half tablet in the middle of night if she wakes up.        Home: Home Living Lives With: Family Available Help at Discharge: Family;Available PRN/intermittently Type of Home: House Home Access: Stairs to enter [employer] of Steps: 11 Entrance Stairs-Rails: Can reach both Home Layout: One level Bathroom Shower/Tub: Tub/shower unit;Curtain Bathroom Toilet: Handicapped height Bathroom Accessibility: Yes How Accessible: Accessible via walker Home Adaptive Equipment: Walker - rolling;Straight cane;Bedside commode/3-in-1;Grab bars in shower;Shower chair with back;Walker - standard  Functional History: Prior Function Able to Take Stairs?: Reciprically Driving: No Vocation: Retired Functional Status:  Mobility: Bed Mobility Bed Mobility: Supine to Sit;Sitting - Scoot to Edge of  Bed Supine to Sit: 4: Min guard;With rails;HOB elevated Sitting - Scoot to Edge of Bed: 4: Min guard Transfers Transfers: Sit to Stand;Stand to Sit Sit to Stand: 4: Min assist;With upper extremity assist;From bed Stand to Sit: 4: Min assist;With upper extremity assist;To chair/3-in-1 Ambulation/Gait Ambulation/Gait Assistance: 1: +2 Total assist;3: Mod assist Ambulation/Gait: Patient Percentage: 60% Ambulation Distance (Feet): 70 Feet Assistive device: 1 person hand held assist;2 person hand held assist Ambulation/Gait Assistance Details: mildly unsteady throughout; short scissored steps, narrow BOS. mild list R and pulling against assist on the Left Gait Pattern: Step-through pattern;Decreased step length - right;Decreased step length - left;Decreased stride length;Scissoring;Narrow base of support Stairs: No Wheelchair Mobility Wheelchair Mobility: No  ADL: ADL Grooming: Performed;Wash/dry face;Set up Where Assessed - Grooming: Unsupported sitting Lower Body Bathing: Simulated;Minimal assistance Where Assessed - Lower Body Bathing: Supported standing Upper Body Dressing: Simulated;Min guard Where Assessed - Upper Body Dressing: Unsupported sitting Lower Body Dressing: Performed;Min guard Where Assessed - Lower Body Dressing: Unsupported sitting Toilet Transfer: Simulated;Minimal assistance Toilet Transfer Method: Sit to stand Toilet Transfer Equipment:  (bed to chair) Equipment Used: Gait belt Transfers/Ambulation Related to ADLs: Mod A with ambulation in room/hallways  Cognition: Cognition Overall Cognitive Status: Impaired Arousal/Alertness: Awake/alert Orientation Level: Oriented X4 Attention: Focused;Sustained;Selective;Alternating Focused Attention: Appears intact Sustained Attention: Appears intact Selective Attention: Appears intact Alternating Attention: Appears intact Memory: Impaired Memory Impairment: Decreased recall of new information;Other (comment) (mild  baseline defcitis persist) Awareness: Impaired Awareness Impairment: Emergent impairment Problem Solving: Appears intact Cognition Overall Cognitive Status: Appears within functional limits for tasks assessed/performed Arousal/Alertness: Awake/alert Orientation Level: Appears intact for tasks assessed Behavior During Session: [facility] for tasks performed  Blood pressure 139/49, pulse 62, temperature 98.4 F (36.9 C), temperature source Oral, resp. rate 18, height 4\' 11"  (1.499 m), weight 64.6 kg (142 lb 6.7 oz), SpO2 100.00%. Physical Exam  Vitals reviewed. Constitutional: She appears well-developed.  HENT:  Head: Normocephalic.  Eyes:       Pupils round and reactive to light  Neck: Neck supple. No thyromegaly present.  Cardiovascular:       Cardiac rate is controlled  Pulmonary/Chest: Breath sounds normal. She has no wheezes.  Abdominal: Bowel sounds are normal. She exhibits no distension. There is no tenderness.  Neurological: She is alert.       Patient names person place and date of birth. She follows simple commands. She does have some decreased coordination and fine motor skills on the right. Right pronator dirft.  RUE 4/5, RLE 4/5. Sensory exam grossly intact. Reasonable insight and awareness.  Skin: Skin is warm and dry.  Psychiatric: She has a normal mood and affect. Her behavior is normal. Judgment and thought content normal.    Results for orders placed during the hospital encounter of 03/17/12 (from the past 24 hour(s))  GLUCOSE, CAPILLARY     Status: Abnormal   Collection Time   03/18/12  7:55 AM      Component Value Range   Glucose-Capillary 104 (*) 70 - 99 mg/dL  GLUCOSE, CAPILLARY     Status: Abnormal   Collection Time   03/18/12  5:04 PM      Component Value Range   Glucose-Capillary 142 (*) 70 - 99 mg/dL  GLUCOSE, CAPILLARY     Status: Abnormal   Collection Time   03/18/12 10:15 PM      Component Value Range   Glucose-Capillary 178 (*) 70 - 99 mg/dL   Dg  Chest 2 View  14/78/2956  *RADIOLOGY REPORT*  Clinical Data: Shortness of breath.  Stroke.  CHEST - 2 VIEW  Comparison: 12/13/2011  Findings: The heart size and vascularity are normal and the lungs are clear.   The multiple old healed right rib fractures.  No effusions or infiltrates.  Dual lead pacer in place.  IMPRESSION: No acute abnormality.   Original Report Authenticated By: Gwynn Burly, M.D.    Ct Head Wo Contrast  03/18/2012  *RADIOLOGY REPORT*  Clinical Data: Right arm weakness post t-PA.  CT HEAD WITHOUT CONTRAST  Technique:  Contiguous axial images were obtained from the base of the skull through the vertex without contrast.  Comparison: 03/17/2012.  Findings: No intracranial hemorrhage.  Prominent small vessel disease type changes limit evaluation for development of an acute small infarct.  No CT evidence of large acute infarct.  Remote right caudate head infarct.  Global atrophy without hydrocephalus.  No intracranial mass lesion detected on this unenhanced exam.  Vascular calcifications.  IMPRESSION: No intracranial hemorrhage or CT evidence of large acute infarct.  Prominent small vessel disease type changes and remote right caudate head infarct.  Global atrophy.   Original Report Authenticated By: Fuller Canada, M.D.  Ct Head Wo Contrast  03/17/2012  *RADIOLOGY REPORT*  Clinical Data: Code stroke. Acute onset right arm weakness.  CT HEAD WITHOUT CONTRAST  Technique:  Contiguous axial images were obtained from the base of the skull through the vertex without contrast.  Comparison: None.  Findings: There is no evidence of intracranial hemorrhage, brain edema or other signs of acute infarction.  There is no evidence of intracranial mass lesion or mass effect.  No abnormal extra-axial fluid collections are identified.  Moderate diffuse cerebral atrophy is seen.  Extensive chronic white matter disease is also demonstrated.  Old right caudate lacunar infarct is noted.  No evidence of  hydrocephalus.  No skull fracture or other bone lesions identified.  IMPRESSION:  1.  No acute intracranial abnormality. 2.  Diffuse cerebral atrophy and extensive chronic small vessel disease.  Old right basal ganglia lacunar infarct.  These results were called by telephone on 03/17/2012 at 1245 hours to Dr. Radford Pax in the emergency department, who verbally acknowledged these results.   Original Report Authenticated By: Danae Orleans, M.D.     Assessment/Plan: Diagnosis: subcortical left brain infarct 1. Does the need for close, 24 hr/day medical supervision in concert with the patient's rehab needs make it unreasonable for this patient to be served in a less intensive setting? Yes 2. Co-Morbidities requiring supervision/potential complications: paf, uti, ckd 3. Due to bladder management, bowel management, safety, skin/wound care, disease management, medication administration and patient education, does the patient require 24 hr/day rehab nursing? Yes 4. Does the patient require coordinated care of a physician, rehab nurse, PT (1-2 hrs/day, 5 days/week) and OT (1-2 hrs/day, 5 days/week) to address physical and functional deficits in the context of the above medical diagnosis(es)? Yes Addressing deficits in the following areas: balance, endurance, locomotion, strength, transferring, bowel/bladder control, bathing, dressing, feeding, grooming, toileting and psychosocial support 5. Can the patient actively participate in an intensive therapy program of at least 3 hrs of therapy per day at least 5 days per week? Yes 6. The potential for patient to make measurable gains while on inpatient rehab is excellent 7. Anticipated functional outcomes upon discharge from inpatient rehab are mod I with PT, mod I with OT, n/a with SLP. 8. Estimated rehab length of stay to reach the above functional goals is: 7-10 days 9. Does the patient have adequate social supports to accommodate these discharge functional goals?  Yes 10. Anticipated D/C setting: Home 11. Anticipated post D/C treatments: Outpt therapy 12. Overall Rehab/Functional Prognosis: excellent  RECOMMENDATIONS: This patient's condition is appropriate for continued rehabilitative care in the following setting: CIR Patient has agreed to participate in recommended program. Yes Note that insurance prior authorization may be required for reimbursement for recommended care.  Comment:Rehab RN to follow up.   Ivory Broad, MD     03/19/2012

## 2012-03-19 NOTE — Evaluation (Addendum)
Occupational Therapy Evaluation **late entry for 10/14** Patient Details Name: [name] MRN: [id number] DOB: Jul 21, 1924 Today's Date: 03/19/2012 Time: 4098-1191 OT Time Calculation (min): 37 min  OT Assessment / Plan / Recommendation Clinical Impression  Pt admitted with RUE weakness and dysmetria. Pt will benefit from skilled OT in the acute setting to maximize I with ADL and ADL mobility prior to d/c home.    OT Assessment  Patient needs continued OT Services    Follow Up Recommendations  Outpatient OT;Supervision/Assistance - 24 hour    Barriers to Discharge      Equipment Recommendations  None recommended by OT;None recommended by PT    Recommendations for Other Services    Frequency  Min 2X/week    Precautions / Restrictions Precautions Precautions: Fall   Pertinent Vitals/Pain Pt with no c/o pain    ADL  Grooming: Performed;Wash/dry face;Set up Where Assessed - Grooming: Unsupported sitting Lower Body Bathing: Simulated;Minimal assistance Where Assessed - Lower Body Bathing: Supported standing Where Assessed - Upper Body Dressing: Unsupported sitting Lower Body Dressing: Performed;Min guard Where Assessed - Lower Body Dressing: Unsupported sitting Toilet Transfer: Simulated;Minimal assistance Toilet Transfer Method: Sit to stand Toilet Transfer Equipment:  (bed to chair) Toileting - Clothing Manipulation and Hygiene: Simulated;Minimal assistance Where Assessed - Glass blower/designer Manipulation and Hygiene: Sit on 3-in-1 or toilet Equipment Used: Gait belt Transfers/Ambulation Related to ADLs: Mod A with ambulation in room/hallways    OT Diagnosis: Generalized weakness;Acute pain;Paresis  OT Problem List: Decreased strength;Decreased range of motion;Impaired balance (sitting and/or standing);Decreased activity tolerance;Decreased coordination;Decreased knowledge of use of DME or AE;Decreased knowledge of precautions;Impaired sensation;Impaired UE functional  use OT Treatment Interventions: Self-care/ADL training;DME and/or AE instruction;Therapeutic activities;Patient/family education;Balance training   OT Goals Acute Rehab OT Goals OT Goal Formulation: With patient Time For Goal Achievement: 03/25/12 Potential to Achieve Goals: Good ADL Goals Pt Will Perform Grooming: Independently;Standing at sink ADL Goal: Grooming - Progress: Goal set today Pt Will Perform Upper Body Dressing: Independently;Sitting, bed;Sitting, chair ADL Goal: Upper Body Dressing - Progress: Goal set today Pt Will Transfer to Toilet: with modified independence;Ambulation;with DME ADL Goal: Toilet Transfer - Progress: Goal set today Pt Will Perform Toileting - Clothing Manipulation: Independently;Standing ADL Goal: Toileting - Clothing Manipulation - Progress: Goal set today Pt Will Perform Toileting - Hygiene: Independently;Sit to stand from 3-in-1/toilet ADL Goal: Toileting - Hygiene - Progress: Goal set today Pt Will Perform Tub/Shower Transfer: Tub transfer;with supervision;Ambulation;with DME ADL Goal: Tub/Shower Transfer - Progress: Goal set today Additional ADL Goal #1: Pt will be I with HEP for coordination and strength in prep for inc I with ADLs ADL Goal: Additional Goal #1 - Progress: Goal set today  Visit Information  Last OT Received On: 03/18/12 Assistance Needed: +2    Subjective Data  Subjective: My hand's not quite right Patient Stated Goal: Return home   Prior Functioning     Home Living Lives With: Family Available Help at Discharge: Family;Available PRN/intermittently Type of Home: House Home Access: Stairs to enter [employer] of Steps: 11 Entrance Stairs-Rails: Can reach both Home Layout: One level Bathroom Shower/Tub: Tub/shower unit;Curtain Bathroom Toilet: Handicapped height Bathroom Accessibility: Yes How Accessible: Accessible via walker Home Adaptive Equipment: Walker - rolling;Straight cane;Bedside  commode/3-in-1;Grab bars in shower;Shower chair with back;Walker - standard Prior Function Level of Independence: Independent;Independent with assistive device(s) Able to Take Stairs?: Reciprically Driving: No Vocation: Retired Musician: No difficulties Dominant Hand: Right         Vision/Perception Vision - Assessment  Eye Alignment: Impaired (comment) Additional Comments: appears to have intermittent right eye esophoria. Will continue to assess   Cognition  Overall Cognitive Status: Appears within functional limits for tasks assessed/performed Arousal/Alertness: Awake/alert Orientation Level: Appears intact for tasks assessed Behavior During Session: [facility] for tasks performed Cognition - Other Comments: did not test higher level cog skills     Extremity/Trunk Assessment Right Upper Extremity Assessment RUE ROM/Strength/Tone: Deficits RUE ROM/Strength/Tone Deficits: grossly 3+/5; pt with little 4th and 5th digit control RUE Sensation: Deficits RUE Sensation Deficits: dec sensation 4th and 5th digits RUE Coordination: Deficits RUE Coordination Deficits: difficulty with grasp and grading reach Left Upper Extremity Assessment LUE ROM/Strength/Tone: WFL for tasks assessed LUE Sensation: WFL - Light Touch LUE Coordination: WFL - gross/fine motor     Mobility Bed Mobility Supine to Sit: 4: Min guard;With rails;HOB elevated Sitting - Scoot to Edge of Bed: 4: Min guard Details for Bed Mobility Assistance: needed rail and extra time Transfers Sit to Stand: 4: Min assist;With upper extremity assist;From bed Stand to Sit: 4: Min assist;With upper extremity assist;To chair/3-in-1 Details for Transfer Assistance: vc's for hand placement; minimal steadying assist     Shoulder Instructions     Exercise     Balance     End of Session OT - End of Session Activity Tolerance: Patient limited by fatigue Patient left: in chair;with call bell/phone within  reach;with family/visitor present Nurse Communication: Mobility status  GO     [name] 03/19/2012, 2:09 PM

## 2012-03-19 NOTE — PMR Pre-admission (Signed)
PMR Admission Coordinator Pre-Admission Assessment  Patient: [name] is an 78 y.o., female MRN: [id number] DOB: 07-Dec-1924 Height: 4\' 11"  (149.9 cm) Weight: 64.6 kg (142 lb 6.7 oz)  Insurance Information HMO:     PPO:       PCP:       IPA:       80/20: yes     OTHER:   PRIMARY: Medicare      Policy#: [id number]      Subscriber: pt CM Name:        Phone#:       Fax#:   Pre-Cert#:        Employer: Retired Benefits:  Phone #:       Name: Armed forces training and education officer. Date: A & B 02/04/92     Deduct: $1184.00      Out of Pocket Max: none      Life Max: none CIR: 100%      SNF: 20 days 100% days 21-100 copay $148.00/day  Outpatient: 80%     Co-Pay: 20% Home Health: 100%      Co-Pay: 20% DME used DME: 80%     Co-Pay: 20% Providers: pt's choice  SECONDARY: none      Policy#:        Subscriber:       Emergency Contact Information Contact Information    Name Relation Home Work Mobile   [place] Daughter [phone]  [phone]     Current Medical History  Patient Admitting Diagnosis: subcortical left brain infarct  History of Present Illness: 78 y.o. right-handed female with history of diabetes mellitus with peripheral neuropathy, chronic kidney disease with baseline creatinine 2.3, PAF with pacemaker. Patient was independent prior to admission using a cane and sometimes a walker secondary to peripheral neuropathy and lives with her daughter who is a Publishing rights manager with a local cardiology group. Admitted 03/17/2012 with right-sided weakness and dizziness. Cranial CT scan was unremarkable for acute changes. She did receive TPA. MRI not completed secondary to pacemaker. Echocardiogram with normal systolic function and ejection fraction of 55%. Carotid Dopplers are pending. EKG sinus tachycardia. Neurology services consulted for suspect left brain stroke and placed on Coumadin therapy with bridging of Lovenox. Subcutaneous Lovenox added for DVT prophylaxis. Patient remains on Keflex 250 mg  daily for chronic urine tract infection. Currently on a dysphagia 1 family quit diet per speech therapy. Physical therapy evaluation completed.   Total: 0     Past Medical History  Past Medical History  Diagnosis Date  . Diabetes mellitus, insulin dependent (IDDM), controlled   . Chronic kidney disease, stage 4, severely decreased GFR     likely related to diabetic and hypertensive nephropathy, no evidence of renal artery stenosis by duplex US in 2008  . Mitral valve disorder   . Pacemaker November 2007    s/p placement complete Parker block status  . Near syncope 12/12/2011  . Aortic stenosis, moderate to severe 12/12/2011  . Chronic degenerative aortic valve disease 12/12/2011  . PAF (paroxysmal atrial fibrillation) 12/12/2011  . H/O hemolytic anemia due to drugs 12/12/2011  . SSS (sick sinus syndrome) with medtronic pacemaker, placed 6 years ago, with micro dislodgement of atrial lead, though still paces--for CHB and paf 12/12/2011  . UTI (lower urinary tract infection), curent and history of UTIs on Keflex daily 12/12/2011  . CAD (coronary artery disease), cardiac cath  12/12/2011  . Anemia, secondary to chronic disease 12/12/2011  . Pacemaker complications     Known chronic dysfunction of  atrial lead with high pacing trhesholds but good sensing.  . Chronic combined systolic and diastolic CHF, NYHA class 2, in combination with AS, MR 12/15/2011  . Bleeding of eye retinal eye bleed    Family History  family history is not on file.  Prior Rehab/Hospitalizations:     Current Medications  Current facility-administered medications:amiodarone (PACERONE) tablet 200 mg, 200 mg, Oral, Daily, Layne Benton, NP, 200 mg at 03/19/12 0931;  aspirin EC tablet 81 mg, 81 mg, Oral, Daily, Layne Benton, NP, 81 mg at 03/19/12 0931;  calcitRIOL (ROCALTROL) capsule 0.25 mcg, 0.25 mcg, Oral, QODAY, Layne Benton, NP, 0.25 mcg at 03/18/12 1804;  cephALEXin (KEFLEX) capsule 250 mg, 250 mg, Oral, Daily, Layne Benton, NP,  250 mg at 03/19/12 0931 coumadin book 1 each, 1 each, Does not apply, Once, Micki Riley, MD, 1 each at 03/18/12 1700;  docusate sodium (COLACE) capsule 200 mg, 200 mg, Oral, Daily, Layne Benton, NP, 200 mg at 03/19/12 0932;  enoxaparin (LOVENOX) injection 40 mg, 40 mg, Subcutaneous, Q24H, Layne Benton, NP, 40 mg at 03/18/12 1711;  ezetimibe (ZETIA) tablet 10 mg, 10 mg, Oral, QPC supper, Micki Riley, MD, 10 mg at 03/18/12 1720 furosemide (LASIX) tablet 40 mg, 40 mg, Oral, q morning - 10a, Layne Benton, NP, 40 mg at 03/19/12 0931;  influenza  inactive virus vaccine (FLUZONE/FLUARIX) injection 0.5 mL, 0.5 mL, Intramuscular, Tomorrow-1000, Micki Riley, MD, 0.5 mL at 03/19/12 0935;  insulin aspart (novoLOG) injection 8 Units, 8 Units, Subcutaneous, TID AC, Layne Benton, NP, 8 Units at 03/19/12 1212 insulin glargine (LANTUS) injection 16 Units, 16 Units, Subcutaneous, q morning - 10a, Layne Benton, NP, 16 Units at 03/19/12 0932;  labetalol (NORMODYNE,TRANDATE) injection 10 mg, 10 mg, Intravenous, Q10 min PRN, Thana Farr, MD;  lidocaine (LIDODERM) 5 % 1 patch, 1 patch, Transdermal, Q24H, Beryle Beams, MD, 1 patch at 03/17/12 2321 pneumococcal 23 valent vaccine (PNU-IMMUNE) injection 0.5 mL, 0.5 mL, Intramuscular, Tomorrow-1000, Micki Riley, MD;  potassium chloride (K-DUR) CR tablet 10 mEq, 10 mEq, Oral, Daily, Layne Benton, NP, 10 mEq at 03/19/12 0931;  senna-docusate (Senokot-S) tablet 1 tablet, 1 tablet, Oral, QHS PRN, Thana Farr, MD;  simvastatin (ZOCOR) tablet 10 mg, 10 mg, Oral, q1800, Micki Riley, MD, 10 mg at 03/18/12 1712 traMADol (ULTRAM) tablet 50 mg, 50 mg, Oral, Q12H PRN, Beryle Beams, MD, 50 mg at 03/17/12 2321;  warfarin (COUMADIN) tablet 2.5 mg, 2.5 mg, Oral, Once, Micki Riley, MD, 2.5 mg at 03/18/12 2316;  warfarin (COUMADIN) tablet 2.5 mg, 2.5 mg, Oral, ONCE-1800, Crystal Roy, PHARMD;  warfarin (COUMADIN) video 1 each, 1 each, Does not apply,  Once, Micki Riley, MD, 1 each at 03/19/12 1200 Warfarin - Pharmacist Dosing Inpatient, , Does not apply, q1800, Micki Riley, MD;  zolpidem (AMBIEN) tablet 2.5 mg, 2.5 mg, Oral, QHS, Layne Benton, NP, 2.5 mg at 03/18/12 2316;  DISCONTD: 0.9 %  sodium chloride infusion, , Intravenous, Continuous, Thana Farr, MD, Last Rate: 50 mL/hr at 03/17/12 1839;  DISCONTD: ezetimibe-simvastatin (VYTORIN) 10-10 MG per tablet 1 tablet, 1 tablet, Oral, Daily, Layne Benton, NP DISCONTD: ondansetron (ZOFRAN) injection 4 mg, 4 mg, Intravenous, Q6H PRN, Thana Farr, MD;  DISCONTD: pantoprazole (PROTONIX) injection 40 mg, 40 mg, Intravenous, QHS, Thana Farr, MD, 40 mg at 03/17/12 2106  Patients Current Diet: Dysphagia  Precautions / Restrictions Precautions Precautions: Fall   Prior Activity Level  Modified  Independent Home Assistive Devices / Equipment Home Assistive Devices/Equipment: Cane (specify quad or straight);Walker (specify type) Home Adaptive Equipment: Walker - rolling;Straight cane;Bedside commode/3-in-1;Grab bars in shower;Shower chair with back;Walker - standard  Prior Functional Level Prior Function Level of Independence: Independent;Independent with assistive device(s) Able to Take Stairs?: Reciprically Driving: No Vocation: Retired  Current Functional Level Cognition  Arousal/Alertness: Awake/alert Overall Cognitive Status: Impaired Overall Cognitive Status: Appears within functional limits for tasks assessed/performed Orientation Level: Oriented X4 Attention: Focused;Sustained;Selective;Alternating Focused Attention: Appears intact Sustained Attention: Appears intact Selective Attention: Appears intact Alternating Attention: Appears intact Memory: Impaired Memory Impairment: Decreased recall of new information;Other (comment) (mild baseline defcitis persist) Awareness: Impaired Awareness Impairment: Emergent impairment Problem Solving: Appears intact      Extremity Assessment (includes Sensation/Coordination)  RUE ROM/Strength/Tone: Deficits RUE ROM/Strength/Tone Deficits: grossly 3+/5; pt with little 4th and 5th digit control RUE Sensation: Deficits RUE Sensation Deficits: dec sensation 4th and 5th digits RUE Coordination: Deficits RUE Coordination Deficits: difficulty with grasp and grading reach  RLE ROM/Strength/Tone: [facility] for tasks assessed;Deficits RLE ROM/Strength/Tone Deficits: weak hamstrings at 3+/5 and pf 3+5 RLE Coordination: Deficits    ADLs  Grooming: Performed;Wash/dry face;Set up Where Assessed - Grooming: Unsupported sitting Lower Body Bathing: Simulated;Minimal assistance Where Assessed - Lower Body Bathing: Supported standing Upper Body Dressing: Simulated;Min guard Where Assessed - Upper Body Dressing: Unsupported sitting Lower Body Dressing: Performed;Min guard Where Assessed - Lower Body Dressing: Unsupported sitting Toilet Transfer: Simulated;Minimal assistance Toilet Transfer Method: Sit to Barista:  (bed to chair) Toileting - Clothing Manipulation and Hygiene: Simulated;Minimal assistance Where Assessed - Engineer, mining and Hygiene: Sit on 3-in-1 or toilet Equipment Used: Gait belt Transfers/Ambulation Related to ADLs: Mod A with ambulation in room/hallways    Mobility  Bed Mobility: Supine to Sit;Sitting - Scoot to Edge of Bed Supine to Sit: 4: Min guard;With rails;HOB elevated Sitting - Scoot to Edge of Bed: 4: Min guard    Transfers  Transfers: Sit to Stand;Stand to Sit Sit to Stand: 4: Min assist;With upper extremity assist;From bed Stand to Sit: 4: Min assist;With upper extremity assist;To chair/3-in-1    Ambulation / Gait / Stairs / Psychologist, prison and probation services  Ambulation/Gait Ambulation/Gait Assistance: 1: +2 Total assist;3: Mod assist Ambulation/Gait: Patient Percentage: 60% Ambulation Distance (Feet): 70 Feet Assistive device: 1 person hand held assist;2  person hand held assist Ambulation/Gait Assistance Details: mildly unsteady throughout; short scissored steps, narrow BOS. mild list R and pulling against assist on the Left Gait Pattern: Step-through pattern;Decreased step length - right;Decreased step length - left;Decreased stride length;Scissoring;Narrow base of support Stairs: No Wheelchair Mobility Wheelchair Mobility: No    Posture / Balance Dynamic Sitting Balance Dynamic Sitting - Balance Support: No upper extremity supported;Feet supported;During functional activity Dynamic Sitting - Level of Assistance: Other (comment) (min guard assist) Dynamic Sitting Balance - Compensations: pulls leg up onto bed to stabilize it for donning socks Static Standing Balance Static Standing - Balance Support: During functional activity;Right upper extremity supported;Left upper extremity supported;Bilateral upper extremity supported Static Standing - Level of Assistance: 4: Min assist     Previous Home Environment Living Arrangements: Children Lives With: Family Available Help at Discharge: Family;Available PRN/intermittently Type of Home: House Home Layout: One level Home Access: Stairs to enter Entrance Stairs-Rails: Can reach both Entrance Stairs-Number of Steps: 11 Bathroom Shower/Tub: Tub/shower unit;Curtain Firefighter: Handicapped height Bathroom Accessibility: Yes How Accessible: Accessible via walker Home Care Services: No  Discharge Living Setting Plans for Discharge Living Setting: Patient's home  Do you have any problems obtaining your medications?: No  Social/Family/Support Systems Patient Roles: Parent Contact Information: [phone] Anticipated Caregiver: daughter/possibly, some cousins Anticipated Caregiver's Contact Information: daughter's cell# [phone] Ability/Limitations of Caregiver: Supervision-Mod I Caregiver Availability: Intermittent (daughter works-might have a cousin come if needs 24/7  S) Discharge Plan Discussed with Primary Caregiver: Yes Is Caregiver In Agreement with Plan?: Yes Does Caregiver/Family have Issues with Lodging/Transportation while Pt is in Rehab?: No  Goals/Additional Needs Patient/Family Goal for Rehab: Mod I Expected length of stay: 7-10 days Pt/Family Agrees to Admission and willing to participate: Yes Program Orientation Provided & Reviewed with Pt/Caregiver Including Roles  & Responsibilities: Yes  Patient Condition: This patient's condition remains as documented in the Consult dated 03/19/12, in which the Rehabilitation Physician determined and documented that the patient's condition is appropriate for intensive rehabilitative care in an inpatient rehabilitation facility.  Preadmission Screen Completed By:  Brock Ra, 03/19/2012 12:40 PM ______________________________________________________________________   Discussed status with Dr. Riley Kill on 03/19/12 at 12:39 and received telephone approval for admission today.  Admission Coordinator:  Brock Ra, time 12:40/Date 03/19/12

## 2012-03-19 NOTE — Progress Notes (Signed)
Patient and caregiver has watched the coumadin video as ordered. All questions answered.

## 2012-03-19 NOTE — Progress Notes (Signed)
Pt admitted to room 4033, pt oriented to room, rehab schedule, and safety plan, pt verbalized an understanding, pt resting in bed with call bell in reach

## 2012-03-19 NOTE — Plan of Care (Addendum)
Overall Plan of Care [facility]) Patient Details Name: [name] MRN: [id number] DOB: Mar 25, 1925  Diagnosis:  Rehabilitation for CVA  Primary Diagnosis:    Probable L pontine infarct Co-morbidities: Diabetes with neuropathy, Aortic stenosis, afib, diastolic CHF, CKD stage 5  Functional Problem List  Patient demonstrates impairments in the following areas: Bowel, Safety and Skin Integrity, balance, motor, activity tolerance, coordination  Basic ADL's: eating, grooming, bathing, dressing and toileting Advanced ADL's: simple meal preparation  Transfers:  bed mobility, bed to chair, toilet, tub/shower and car, furniture Locomotion:  ambulation and stairs  Additional Impairments:  Functional use of upper extremity  Anticipated Outcomes Item Anticipated Outcome  Eating/Swallowing    Basic self-care  Modified independent  Tolieting  Modified independent  Bowel/Bladder  Pt continent of bowel and bladder  Transfers  Modified independent basic, furniture  Supervision car  Locomotion  Modified independent gait controlled env x 150', home env x 50', supervision stairs 12  Communication    Cognition    Pain  Pt denies pain  Safety/Judgment  Pt will remain free from falls during admission  Other  Skin will remain intact free from breakdown during admission   Therapy Plan: PT Frequency: 1-2 X/day, 60-90 minutes;5 out of 7 days OT Frequency: 1-2 X/day, 60-90 minutes     Team Interventions: Item RN PT OT SLP SW TR Other  Self Care/Advanced ADL Retraining   x      Neuromuscular Re-Education  x x      Therapeutic Activities  x x      UE/LE Strength Training/ROM  x x      UE/LE Coordination Activities  x x      Visual/Perceptual Remediation/Compensation   x      DME/Adaptive Equipment Instruction  x x      Therapeutic Exercise  x x      Balance/Vestibular Training  x x      Patient/Family Education  x x      Cognitive Remediation/Compensation         Functional Mobility  Training  x x      Ambulation/Gait Training  x       Museum/gallery curator  x       Wheelchair Propulsion/Positioning  x       Functional Statistician  x       Community Reintegration   x      Dysphagia/Aspiration Film/video editor         Bladder Management x        Bowel Management x        Disease Management/Prevention         Pain Management x  x      Medication Management x        Skin Care/Wound Management         Splinting/Orthotics  x       Discharge Planning  x x      Psychosocial Support x  x                         Team Discharge Planning: Destination:  Home Projected Follow-up:  Nursing, PT HH transitioning to OP, OT and Home Health Projected Equipment Needs:  Walker, tub bench Patient/family involved in discharge planning:  Yes  MD ELOS: 2 wks Medical Rehab Prognosis:  Good Assessment: 78 yo female admitted for R HP due to CVA, now requiring 24/7 Rehab  RN /MD, CIR level PT, OT.

## 2012-03-20 ENCOUNTER — Inpatient Hospital Stay (HOSPITAL_COMMUNITY): Payer: Medicare Other | Admitting: Occupational Therapy

## 2012-03-20 ENCOUNTER — Inpatient Hospital Stay (HOSPITAL_COMMUNITY): Payer: Medicare Other | Admitting: Speech Pathology

## 2012-03-20 ENCOUNTER — Inpatient Hospital Stay (HOSPITAL_COMMUNITY): Payer: Medicare Other

## 2012-03-20 DIAGNOSIS — I6330 Cerebral infarction due to thrombosis of unspecified cerebral artery: Secondary | ICD-10-CM

## 2012-03-20 DIAGNOSIS — Z5189 Encounter for other specified aftercare: Secondary | ICD-10-CM

## 2012-03-20 DIAGNOSIS — G8110 Spastic hemiplegia affecting unspecified side: Secondary | ICD-10-CM

## 2012-03-20 DIAGNOSIS — I639 Cerebral infarction, unspecified: Secondary | ICD-10-CM

## 2012-03-20 LAB — CBC WITH DIFFERENTIAL/PLATELET
Basophils Absolute: 0 10*3/uL (ref 0.0–0.1)
Basophils Relative: 0 % (ref 0–1)
Eosinophils Absolute: 0.4 10*3/uL (ref 0.0–0.7)
Lymphs Abs: 1.2 10*3/uL (ref 0.7–4.0)
MCH: 30.4 pg (ref 26.0–34.0)
Neutrophils Relative %: 66 % (ref 43–77)
Platelets: 213 10*3/uL (ref 150–400)
RBC: 2.63 MIL/uL — ABNORMAL LOW (ref 3.87–5.11)
RDW: 14.1 % (ref 11.5–15.5)

## 2012-03-20 LAB — COMPREHENSIVE METABOLIC PANEL
ALT: 10 U/L (ref 0–35)
AST: 19 U/L (ref 0–37)
Albumin: 2.7 g/dL — ABNORMAL LOW (ref 3.5–5.2)
Alkaline Phosphatase: 72 U/L (ref 39–117)
Glucose, Bld: 101 mg/dL — ABNORMAL HIGH (ref 70–99)
Potassium: 3.9 mEq/L (ref 3.5–5.1)
Sodium: 138 mEq/L (ref 135–145)
Total Protein: 5.6 g/dL — ABNORMAL LOW (ref 6.0–8.3)

## 2012-03-20 LAB — GLUCOSE, CAPILLARY
Glucose-Capillary: 105 mg/dL — ABNORMAL HIGH (ref 70–99)
Glucose-Capillary: 160 mg/dL — ABNORMAL HIGH (ref 70–99)
Glucose-Capillary: 87 mg/dL (ref 70–99)

## 2012-03-20 MED ORDER — INSULIN ASPART 100 UNIT/ML ~~LOC~~ SOLN
0.0000 [IU] | Freq: Three times a day (TID) | SUBCUTANEOUS | Status: DC
  Administered 2012-03-20 (×2): 3 [IU] via SUBCUTANEOUS
  Administered 2012-03-21: 5 [IU] via SUBCUTANEOUS
  Administered 2012-03-22: 3 [IU] via SUBCUTANEOUS
  Administered 2012-03-23 – 2012-03-24 (×2): 8 [IU] via SUBCUTANEOUS
  Administered 2012-03-24: 2 [IU] via SUBCUTANEOUS
  Administered 2012-03-25: 3 [IU] via SUBCUTANEOUS
  Administered 2012-03-26 – 2012-03-27 (×3): 2 [IU] via SUBCUTANEOUS

## 2012-03-20 MED ORDER — ZOLPIDEM TARTRATE 5 MG PO TABS
5.0000 mg | ORAL_TABLET | Freq: Every evening | ORAL | Status: DC | PRN
  Administered 2012-03-22: 5 mg via ORAL
  Filled 2012-03-20 (×3): qty 1

## 2012-03-20 MED ORDER — ZOLPIDEM TARTRATE 5 MG PO TABS
2.5000 mg | ORAL_TABLET | Freq: Every evening | ORAL | Status: DC | PRN
  Administered 2012-03-20 – 2012-03-27 (×11): 2.5 mg via ORAL
  Filled 2012-03-20 (×7): qty 1

## 2012-03-20 MED ORDER — BACID PO TABS
2.0000 | ORAL_TABLET | Freq: Three times a day (TID) | ORAL | Status: DC
  Filled 2012-03-20 (×4): qty 2

## 2012-03-20 MED ORDER — RISAQUAD PO CAPS
2.0000 | ORAL_CAPSULE | Freq: Three times a day (TID) | ORAL | Status: DC
  Administered 2012-03-20 – 2012-03-28 (×25): 2 via ORAL
  Filled 2012-03-20 (×28): qty 2

## 2012-03-20 MED ORDER — WARFARIN SODIUM 4 MG PO TABS
4.0000 mg | ORAL_TABLET | Freq: Once | ORAL | Status: AC
  Administered 2012-03-20: 4 mg via ORAL
  Filled 2012-03-20: qty 1

## 2012-03-20 NOTE — Progress Notes (Signed)
Social Work Patient ID: [name], female   DOB: [birth year], 78 y.o.   MRN: [id number] Met with pt to inform of team conference goals-mod/i-supervision level and discharge 10/25.  Main issue is 11 steps to get into the home, will begin  To work on this.  Pt agreeable and not sure if will have cousin come at discharge.  Have left a message for daughter, await return call.  Continue to  Work on discharge plans.

## 2012-03-20 NOTE — Care Management Note (Signed)
Inpatient Rehabilitation Center Individual Statement of Services  Patient Name:  [name]  Date:  03/20/2012  Welcome to the Inpatient Rehabilitation Center.  Our goal is to provide you with an individualized program based on your diagnosis and situation, designed to meet your specific needs.  With this comprehensive rehabilitation program, you will be expected to participate in at least 3 hours of rehabilitation therapies Monday-Friday, with modified therapy programming on the weekends.  Your rehabilitation program will include the following services:  Physical Therapy (PT), Occupational Therapy (OT), Speech Therapy (ST), 24 hour per day rehabilitation nursing, Therapeutic Recreaction (TR), Case Management (RN and Child psychotherapist), Rehabilitation Medicine, Nutrition Services and Pharmacy Services  Weekly team conferences will be held on Wednesday to discuss your progress.  Your RN Case Designer, television/film set will talk with you frequently to get your input and to update you on team discussions.  Team conferences with you and your family in attendance may also be held.  Expected length of stay: 7-10 days  Overall anticipated outcome: mod/i-supervision level  Depending on your progress and recovery, your program may change.  Your RN Case Estate agent will coordinate services and will keep you informed of any changes.  Your RN Sports coach and SW names and contact numbers are listed  below.  The following services may also be recommended but are not provided by the Inpatient Rehabilitation Center:   Driving Evaluations  Home Health Rehabiltiation Services  Outpatient Rehabilitatation [facility]  Vocational Rehabilitation   Arrangements will be made to provide these services after discharge if needed.  Arrangements include referral to agencies that provide these services.  Your insurance has been verified to be:  Medicare & AARP Your primary doctor is:  Dr Merlene Laughter  Pertinent information will be shared with your doctor and your insurance company.   Social Worker:  Dossie Der, [place] [phone]  Information discussed with and copy given to patient by: Lucy Chris, 03/20/2012, 8:35 AM

## 2012-03-20 NOTE — Progress Notes (Signed)
ANTICOAGULATION CONSULT NOTE - Follow Up Consult  Pharmacy Consult for Coumadin, s/p TPA infusion. Indication: CVA and PAF  Vital Signs: Temp: 97.9 F (36.6 C) (10/16 0628) Temp src: Oral (10/16 0628) BP: 129/79 mmHg (10/16 0628) Pulse Rate: 63  (10/16 0919)  Labs:  Basename 03/20/12 0455 03/19/12 0550 03/17/12 1243 03/17/12 1240  HGB 8.0* 8.8* -- --  HCT 24.9* 27.9* 31.0* --  PLT 213 243 -- 247  APTT -- -- -- 31  LABPROT 13.9 14.2 -- 12.7  INR 1.08 1.11 -- 0.96  HEPARINUNFRC -- -- -- --  CREATININE 2.19* -- 2.30* 2.37*  CKTOTAL -- -- -- --  CKMB -- -- -- --  TROPONINI -- -- -- <0.30    Estimated Creatinine Clearance: 15.2 ml/min (by C-G formula based on Cr of 2.19).  Assessment:78 y.o.female admitted for Cerebral embolism with cerebral infarction and atrial fibrillation, s/p TPA infusion. CT negative for hemorrhage. INR remains subtherapeutic at 1.08 today after 2 dose of coumadin 2.5mg . No bleeding noted. CBC is stable.   Goal of Therapy:  INR around 2.5 Monitor platelets by anticoagulation protocol: Yes   Plan:  1. Coumadin 4mg  PO x 1 tonight 2. F/u AM INR  Dejuan Elman, Drake Leach 03/20/2012,10:49 AM

## 2012-03-20 NOTE — Evaluation (Signed)
Speech Language Pathology Assessment and Plan  Patient Details  Name: [name] MRN: [id number] Date of Birth: Mar 17, 1925  SLP Diagnosis: Cognitive Impairments;Other (comment) (mild baseline memory deficits)  Rehab Potential: Good ELOS: anticipated d/c 10/25   Today's Date: 03/20/2012 Time: 4098-1191 Time Calculation (min): 60 min  Problem List:  Patient Active Problem List  Diagnosis  . Aortic stenosis, severe  . Mitral regurgitation, moderate to severe  . PAF (paroxysmal atrial fibrillation)  . Diabetes mellitus, insulin dependent (IDDM), controlled  . History of drug-induced hemolytic anemia quinine  . SSS (sick sinus syndrome) with medtronic pacemaker, placed 6 years ago, with micro dislodgement of atrial lead, though still paces--for CHB and paf  . UTI (lower urinary tract infection), curent and history of UTIs on Keflex daily  . CAD (coronary artery disease), cardiac cath   . Anemia of chronic disease  . Near syncope with AF/AT  . CKD (chronic kidney disease) stage 5, GFR less than 15 ml/min  . CHF (congestive heart failure)  . Chronic combined systolic and diastolic CHF, NYHA class 2, in combination with AS, MR  . Cerebral embolism with cerebral infarction  . Hemiplegia, unspecified, affecting dominant side  . Hyperlipidemia LDL goal < 70  . CVA (cerebral infarction)   Past Medical History:  Past Medical History  Diagnosis Date  . Diabetes mellitus, insulin dependent (IDDM), controlled   . Chronic kidney disease, stage 4, severely decreased GFR     likely related to diabetic and hypertensive nephropathy, no evidence of renal artery stenosis by duplex US in 2008  . Mitral valve disorder   . Pacemaker November 2007    s/p placement complete heart block status  . Near syncope 12/12/2011  . Aortic stenosis, moderate to severe 12/12/2011  . Chronic degenerative aortic valve disease 12/12/2011  . PAF (paroxysmal atrial fibrillation) 12/12/2011  . H/O hemolytic anemia due  to drugs 12/12/2011  . SSS (sick sinus syndrome) with medtronic pacemaker, placed 6 years ago, with micro dislodgement of atrial lead, though still paces--for CHB and paf 12/12/2011  . UTI (lower urinary tract infection), curent and history of UTIs on Keflex daily 12/12/2011  . CAD (coronary artery disease), cardiac cath  12/12/2011  . Anemia, secondary to chronic disease 12/12/2011  . Pacemaker complications     Known chronic dysfunction of atrial lead with high pacing trhesholds but good sensing.  . Chronic combined systolic and diastolic CHF, NYHA class 2, in combination with AS, MR 12/15/2011  . Bleeding of eye retinal eye bleed   Past Surgical History:  Past Surgical History  Procedure Date  . Pacemaker insertion November 2007    Medtronic EnRhythm dual-chamber [id number]  . Cardiac catheterization 2005    60% stenosis of 1st diagnoal branch, 60-70% stenosis of posterolateral brach of distal RCA, preserved LV with EF 50-55%, diastolic dysfunction  . Eye surgery retinal eye bleed  . Abdominal hysterectomy     Assessment / Plan / Recommendation Clinical Impression  [name] is an 78 y.o. right-handed female with history of diabetes mellitus with peripheral neuropathy, chronic kidney disease with baseline creatinine 2.3, PAF with pacemaker. Patient was independent prior to admission using a cane and sometimes a walker secondary to peripheral neuropathy and lives with her daughter who is a Publishing rights manager with a local cardiology group. Admitted 03/17/2012 with right-sided weakness and dizziness. Cranial CT scan was unremarkable for acute changes. She did receive TPA. MRI not completed secondary to pacemaker. Neurology services consulted for suspect left brain stroke.  Currently on dysphagia 1 textures, thin liquids.  Physical therapy evaluation completed.  M.D. requested physical medicine rehabilitation consult to consider inpatient rehabilitation services. Patient was felt to be a good candidate  for inpatient rehabilitation services and was admitted for a comprehensive rehabilitation program.   Patient admitted to [facility] 03/19/2012.  SLP evaluation revealed mildly decreased recall of new information that patient and family both report to be baseline which does not appear to impact her safety.  While patient demonstrates a slight emergent awareness impairment, she demonstrated anticipatory awareness during the session by initiating compensations in anticipation of future problems.   Patient exhibited no overt signs or symptoms of aspiration while self-feeding thin liquids via cup and straw, puree, and solids, as well as adequate mastication of solids with modified independence. As a result, patient's diet was upgraded to regular textures and thin liquids.   No further CIR level SLP services recommended at this time.      SLP Assessment  Patient will not need skilled Speech Lanaguage Pathology Services during CIR admission    Recommendations  Follow up Recommendations: None Equipment Recommended: None recommended by SLP       Pain Pain Assessment Pain Assessment: No/denies pain Prior Functioning Cognitive/Linguistic Baseline: Baseline deficits Baseline deficit details: memory  Type of Home: House Lives With: Family Available Help at Discharge: Family;Available PRN/intermittently Vocation: Retired   See FIM for current functional status Refer to Care Plan for Long Term Goals  Recommendations for other services: None  Discharge Criteria: Patient will be discharged from SLP if patient refuses treatment 3 consecutive times without medical reason, if treatment goals not met, if there is a change in medical status, if patient makes no progress towards goals or if patient is discharged from hospital.  The above assessment, treatment plan, treatment alternatives and goals were discussed and mutually agreed upon: by patient and by family  [name] 03/20/2012, 4:19 PM

## 2012-03-20 NOTE — Progress Notes (Signed)
Patient ID: [name], female   DOB: [birth year], 78 y.o.   MRN: [id number] [name] is a 78 y.o. right-handed female with history of diabetes mellitus with peripheral neuropathy, chronic kidney disease with baseline creatinine 2.3, PAF with pacemaker. Patient was independent prior to admission using a cane and sometimes a walker secondary to peripheral neuropathy and lives with her daughter who is a Publishing rights manager with a local cardiology group. Admitted 03/17/2012 with right-sided weakness and dizziness. Cranial CT scan was unremarkable for acute changes. She did receive TPA. MRI not completed secondary to pacemaker. Echocardiogram with normal systolic function and ejection fraction of 55%. Carotid Dopplers demonstrated 40-59% right ICA stenosis. EKG sinus tachycardia. Neurology services consulted for suspect left brain stroke and placed on Coumadin therapy with bridging of Lovenox. Subcutaneous Lovenox added for DVT prophylaxis.  Subjective/Complaints: Slept poorly but no specific reason.  Takes 2.5 mg ambien at home and sometimes repeats x 1  Objective: Vital Signs: Blood pressure 129/79, pulse 69, temperature 97.9 F (36.6 C), temperature source Oral, resp. rate 18, height 4\' 11"  (1.499 m), weight 63.8 kg (140 lb 10.5 oz), SpO2 95.00%. Dg Chest 2 View  03/18/2012  *RADIOLOGY REPORT*  Clinical Data: Shortness of breath.  Stroke.  CHEST - 2 VIEW  Comparison: 12/13/2011  Findings: The heart size and vascularity are normal and the lungs are clear.   The multiple old healed right rib fractures.  No effusions or infiltrates.  Dual lead pacer in place.  IMPRESSION: No acute abnormality.   Original Report Authenticated By: Gwynn Burly, M.D.    Ct Head Wo Contrast  03/18/2012  *RADIOLOGY REPORT*  Clinical Data: Right arm weakness post t-PA.  CT HEAD WITHOUT CONTRAST  Technique:  Contiguous axial images were obtained from the base of the skull through the vertex without contrast.   Comparison: 03/17/2012.  Findings: No intracranial hemorrhage.  Prominent small vessel disease type changes limit evaluation for development of an acute small infarct.  No CT evidence of large acute infarct.  Remote right caudate head infarct.  Global atrophy without hydrocephalus.  No intracranial mass lesion detected on this unenhanced exam.  Vascular calcifications.  IMPRESSION: No intracranial hemorrhage or CT evidence of large acute infarct.  Prominent small vessel disease type changes and remote right caudate head infarct.  Global atrophy.   Original Report Authenticated By: Fuller Canada, M.D.    Results for orders placed during the hospital encounter of 03/19/12 (from the past 72 hour(s))  GLUCOSE, CAPILLARY     Status: Normal   Collection Time   03/19/12  4:53 PM      Component Value Range Comment   Glucose-Capillary 76  70 - 99 mg/dL   GLUCOSE, CAPILLARY     Status: Abnormal   Collection Time   03/19/12  9:00 PM      Component Value Range Comment   Glucose-Capillary 164 (*) 70 - 99 mg/dL   CBC WITH DIFFERENTIAL     Status: Abnormal   Collection Time   03/20/12  4:55 AM      Component Value Range Comment   WBC 6.8  4.0 - 10.5 K/uL    RBC 2.63 (*) 3.87 - 5.11 MIL/uL    Hemoglobin 8.0 (*) 12.0 - 15.0 g/dL    HCT 40.9 (*) 81.1 - 46.0 %    MCV 94.7  78.0 - 100.0 fL    MCH 30.4  26.0 - 34.0 pg    MCHC 32.1  30.0 - 36.0 g/dL  RDW 14.1  11.5 - 15.5 %    Platelets 213  150 - 400 K/uL    Neutrophils Relative 66  43 - 77 %    Neutro Abs 4.5  1.7 - 7.7 K/uL    Lymphocytes Relative 18  12 - 46 %    Lymphs Abs 1.2  0.7 - 4.0 K/uL    Monocytes Relative 11  3 - 12 %    Monocytes Absolute 0.7  0.1 - 1.0 K/uL    Eosinophils Relative 5  0 - 5 %    Eosinophils Absolute 0.4  0.0 - 0.7 K/uL    Basophils Relative 0  0 - 1 %    Basophils Absolute 0.0  0.0 - 0.1 K/uL   COMPREHENSIVE METABOLIC PANEL     Status: Abnormal   Collection Time   03/20/12  4:55 AM      Component Value Range  Comment   Sodium 138  135 - 145 mEq/L    Potassium 3.9  3.5 - 5.1 mEq/L    Chloride 103  96 - 112 mEq/L    CO2 25  19 - 32 mEq/L    Glucose, Bld 101 (*) 70 - 99 mg/dL    BUN 54 (*) 6 - 23 mg/dL    Creatinine, Ser 1.61 (*) 0.50 - 1.10 mg/dL    Calcium 9.1  8.4 - 09.6 mg/dL    Total Protein 5.6 (*) 6.0 - 8.3 g/dL    Albumin 2.7 (*) 3.5 - 5.2 g/dL    AST 19  0 - 37 U/L    ALT 10  0 - 35 U/L    Alkaline Phosphatase 72  39 - 117 U/L    Total Bilirubin 0.2 (*) 0.3 - 1.2 mg/dL    GFR calc non Af Amer 19 (*) >90 mL/min    GFR calc Af Amer 22 (*) >90 mL/min   PROTIME-INR     Status: Normal   Collection Time   03/20/12  4:55 AM      Component Value Range Comment   Prothrombin Time 13.9  11.6 - 15.2 seconds    INR 1.08  0.00 - 1.49      HEENT: normal Cardio: RRR Resp: CTA B/L GI: BS positive Extremity:  Pulses positive Skin:   Intact Neuro: Alert/Oriented, Cranial Nerve II-XII normal, Abnormal Sensory reduced sensation in bilateral feet, Abnormal Motor 4-/5 strength in the R delt,bi,tri,grip and Abnormal FMC Ataxic/ dec FMC Musc/Skel:  Other Hallux valgus GEN: NAD   Assessment/Plan: 1. Functional deficits secondary to L pontine embolic infarct not visualized on CT which require 3+ hours per day of interdisciplinary therapy in a comprehensive inpatient rehab setting. Physiatrist is providing close team supervision and 24 hour management of active medical problems listed below. Physiatrist and rehab team continue to assess barriers to discharge/monitor patient progress toward functional and medical goals. FIM:                   Comprehension Comprehension Mode: Auditory Comprehension: 7-Follows complex conversation/direction: With no assist  Expression Expression Mode: Verbal Expression: 7-Expresses complex ideas: With no assist     Problem Solving Problem Solving: 6-Solves complex problems: With extra time  Memory Memory: 5-Recognizes or recalls 90% of the  time/requires cueing < 10% of the time Medical Problem List and Plan:  1. Subcortical left brain infarcts  2. DVT Prophylaxis/Anticoagulation: Chronic Coumadin therapy with Lovenox for bridging until INR greater than 2.00  3. Pain Management: Ultram 50 mg  every 12 hours as needed, Lidoderm patch. Monitor with increased mobility  4. Neuropsych: This patient is capable of making decisions on his/her own behalf.  5. PAF/pacemaker. Amiodarone 200 mg daily. Cardiac rate control  6. Chronic kidney disease. Baseline creatinine 2.3. Continue Lasix 40 mg daily as well as Calcitrol 0.25 mcg every other day. Followup chemistries  7. Diabetes mellitus with peripheral neuropathy. Hemoglobin A1c 6.1. Continue Lantus insulin 16 units daily and NovoLog 8 units 3 times a day. Check blood sugars a.c. and at bedtime  8. CHF. Continue Lasix 40 mg daily and monitor for any signs of fluid overload  9. Chronic urinary tract infection. Keflex 200 mg daily prophylactically  10. Hyperlipidemia. Zetia/Zocor  11. Anemia of chronic disease. Latest hemoglobin 8.8. Monitor for any bleeding episodes and followup CBC 12.  Insomia, resume home dose ambien  LOS (Days) 1 A FACE TO FACE EVALUATION WAS PERFORMED  KIRSTEINS,ANDREW E 03/20/2012, 7:01 AM

## 2012-03-20 NOTE — Evaluation (Addendum)
Physical Therapy Assessment and Plan  Patient Details  Name: [name] MRN: [id number] Date of Birth: February 18, 1925  PT Diagnosis: Difficulty walking, Hemiparesis dominant, Impaired sensation and Muscle weakness Rehab Potential: Good ELOS: 10-14 days   Today's Date: 03/20/2012 Time: 0905-1000 Time Calculation (min): 55 min  Problem List:  Patient Active Problem List  Diagnosis  . Aortic stenosis, severe  . Mitral regurgitation, moderate to severe  . PAF (paroxysmal atrial fibrillation)  . Diabetes mellitus, insulin dependent (IDDM), controlled  . History of drug-induced hemolytic anemia quinine  . SSS (sick sinus syndrome) with medtronic pacemaker, placed 6 years ago, with micro dislodgement of atrial lead, though still paces--for CHB and paf  . UTI (lower urinary tract infection), curent and history of UTIs on Keflex daily  . CAD (coronary artery disease), cardiac cath   . Anemia of chronic disease  . Near syncope with AF/AT  . CKD (chronic kidney disease) stage 5, GFR less than 15 ml/min  . CHF (congestive heart failure)  . Chronic combined systolic and diastolic CHF, NYHA class 2, in combination with AS, MR  . Cerebral embolism with cerebral infarction  . Hemiplegia, unspecified, affecting dominant side  . Hyperlipidemia LDL goal < 70  . CVA (cerebral infarction)    Past Medical History:  Past Medical History  Diagnosis Date  . Diabetes mellitus, insulin dependent (IDDM), controlled   . Chronic kidney disease, stage 4, severely decreased GFR     likely related to diabetic and hypertensive nephropathy, no evidence of renal artery stenosis by duplex US in 2008  . Mitral valve disorder   . Pacemaker November 2007    s/p placement complete heart block status  . Near syncope 12/12/2011  . Aortic stenosis, moderate to severe 12/12/2011  . Chronic degenerative aortic valve disease 12/12/2011  . PAF (paroxysmal atrial fibrillation) 12/12/2011  . H/O hemolytic anemia due to drugs  12/12/2011  . SSS (sick sinus syndrome) with medtronic pacemaker, placed 6 years ago, with micro dislodgement of atrial lead, though still paces--for CHB and paf 12/12/2011  . UTI (lower urinary tract infection), curent and history of UTIs on Keflex daily 12/12/2011  . CAD (coronary artery disease), cardiac cath  12/12/2011  . Anemia, secondary to chronic disease 12/12/2011  . Pacemaker complications     Known chronic dysfunction of atrial lead with high pacing trhesholds but good sensing.  . Chronic combined systolic and diastolic CHF, NYHA class 2, in combination with AS, MR 12/15/2011  . Bleeding of eye retinal eye bleed   Past Surgical History:  Past Surgical History  Procedure Date  . Pacemaker insertion November 2007    Medtronic EnRhythm dual-chamber [id number]  . Cardiac catheterization 2005    60% stenosis of 1st diagnoal branch, 60-70% stenosis of posterolateral brach of distal RCA, preserved LV with EF 50-55%, diastolic dysfunction  . Eye surgery retinal eye bleed  . Abdominal hysterectomy     Assessment & Plan Clinical Impression: [name] is a 78 y.o. right-handed female with history of diabetes mellitus with peripheral neuropathy, chronic kidney disease with baseline creatinine 2.3, PAF with pacemaker. Patient was independent prior to admission using a cane and sometimes a walker secondary to peripheral neuropathy and lives with her daughter who is a Publishing rights manager with a local cardiology group. Admitted 03/17/2012 with right-sided weakness and dizziness. Cranial CT scan was unremarkable for acute changes. She did receive TPA. MRI not completed secondary to pacemaker. Echocardiogram with normal systolic function and ejection fraction of 55%. Carotid  Dopplers demonstrated 40-59% right ICA stenosis. EKG sinus tachycardia. Neurology services consulted for suspect left brain stroke and placed on Coumadin therapy with bridging of Lovenox. Patient transferred to CIR on 03/19/2012 .    Patient currently requires mod with mobility secondary to decreased cardiorespiratoy endurance and impaired timing and sequencing, unbalanced muscle activation and decreased coordination.  Prior to hospitalization, patient was independent with mobility and lived with daughter who works,    in a single level    home.  Home access is 11 steps total,( 7 steps, then a landing, then 4 more steps), with 2 rails reachable at same time.  Patient will benefit from skilled PT intervention to maximize safe functional mobility, minimize fall risk and decrease caregiver burden for planned discharge home with intermittent assist.  Anticipate patient will benefit from follow up HH at discharge, transitioning to OPPT.  PT - End of Session Activity Tolerance: Tolerates 10 - 20 min activity with multiple rests Endurance Deficit: Yes Endurance Deficit Description: dyspnea 3/4  after gait on 5 steps PT Assessment Rehab Potential: Good PT Plan PT Frequency: 1-2 X/day, 60-90 minutes;5 out of 7 days Estimated Length of Stay: 10-14 days PT Treatment/Interventions: Ambulation/gait training;Balance/vestibular training;Discharge planning;Functional electrical stimulation;DME/adaptive equipment instruction;Functional mobility training;Neuromuscular re-education;Patient/family education;Splinting/orthotics;Stair training;UE/LE Strength taining/ROM;Therapeutic Exercise;Therapeutic Activities;UE/LE Coordination activities;Wheelchair propulsion/positioning PT Recommendation Follow Up Recommendations: Home health PT Equipment Recommended: Tub/shower bench Equipment Details: pt owns a 5HQ  PT Evaluation Precautions/Restrictions Precautions Precautions: Fall Precaution Comments: has broken L ankle x 2 Restrictions Weight Bearing Restrictions: No  Vital Signs Therapy Vitals Pulse Rate: 63  Patient Position, if appropriate: Sitting Oxygen Therapy SpO2: 98 % O2 Device: None (Room air) Pulse Oximetry Type:  Intermittent Pain Pain Assessment Pain Assessment: No/denies pain Home Living/Prior Functioning Home Living Entrance Stairs-Number of Steps: 7 steps, then a landing, then 4 more steps Entrance Stairs-Rails: Left;Right;Can reach both Prior Function Level of Independence:  (used RW for longer distances indoors; SPC otherwise) Able to Take Stairs?: Reciprically Driving: No Vision/Perception  Vision - History Baseline Vision: Wears glasses all the time Visual History: Corrective eye surgery;Other (comment) (pt reports history of blood on her eye?) Patient Visual Report: No change from baseline Vision - Assessment Eye Alignment: Within Functional Limits Vision Assessment: Vision tested Additional Comments: No noted visual deficits with gross testing.  Will continue to assess in the context of treatment. Perception Perception: Within Functional Limits Praxis Praxis: Intact  Cognition Overall Cognitive Status: Appears within functional limits for tasks assessed Arousal/Alertness: Awake/alert Orientation Level: Oriented X4 Attention: Selective Focused Attention: Appears intact Sustained Attention: Appears intact Selective Attention: Appears intact Sensation Sensation Light Touch: Impaired Detail Light Touch Impaired Details: Impaired RLE;Impaired LLE (absent great toes bil feet) Stereognosis: Appears Intact Hot/Cold: Appears Intact Proprioception: Appears Intact Coordination Gross Motor Movements are Fluid and Coordinated: No Fine Motor Movements are Fluid and Coordinated: No Coordination and Movement Description: Pt with dysmetria with finger to nose testing in the RUE.  Slight decreased FM coordination as well. Heel Shin Test: decreased speed, accuracy RLE Motor  Motor Motor: Hemiplegia  Mobility Bed Mobility Bed Mobility: Scooting to HOB;Sitting - Scoot to [place] of Bed;Sit to Supine Sitting - Scoot to Edge of Bed: 5: Supervision Sit to Supine: 5: Supervision Scooting to  [facility] [place]: 5: Set up Transfers Sit to Stand: 5: Supervision Stand to Sit: 4: Min assist;With upper extremity assist Stand to Sit Details (indicate cue type and reason): Verbal cues for precautions/safety Stand to Sit Details: poor eccentric control Locomotion  Ambulation  Ambulation: Yes Ambulation/Gait Assistance: 3: Mod assist Ambulation Distance (Feet): 60 Feet Ambulation/Gait Assistance Details: Manual facilitation for weight shifting Gait Gait: Yes Gait Pattern: Impaired Gait Pattern: Trendelenburg; narrow BOS, limited R hip and knee flexion and ankle DF Gait velocity: slowed Stairs / Additional Locomotion Stairs: Yes Stairs Assistance: 4: Min assist Stairs Assistance Details: Verbal cues for gait pattern Stair Management Technique: Two rails Number of Stairs: 5  Height of Stairs: 7  Wheelchair Mobility Wheelchair Mobility: Yes Wheelchair Assistance: 4: Min Education officer, museum: Right upper extremity;Left upper extremity Wheelchair Parts Management: Needs assistance Distance: 40  Trunk/Postural Assessment  Cervical Assessment Cervical Assessment: Within Functional Limits Thoracic Assessment Thoracic Assessment: Within Functional Limits Thoracic Strength Overall Thoracic Strength Comments: slight kyphotic posturing in sitting and standing Lumbar Assessment Lumbar Assessment: Within Functional Limits Postural Control Postural Control: Within Functional Limits  Balance Balance Balance Assessed: Yes Dynamic Sitting Balance Dynamic Sitting - Balance Support: Feet supported Dynamic Sitting - Level of Assistance: 5: Stand by assistance Static Standing Balance Static Standing - Balance Support: Right upper extremity supported;Left upper extremity supported Static Standing - Level of Assistance: 5: Stand by assistance Extremity Assessment  RUE Assessment RUE Assessment: Exceptions to [facility] RUE Strength RUE Overall Strength Comments: Pt with history of rotator cuff  injury prior to CVA.  Now with increased weakness and decreased coordination compared to baseline.  Elbow flexion and extension 3+/5, grip 3+5, decresed ability to isolate and extend the 4th and 5th digits. LUE Assessment LUE Assessment: Within Functional Limits RLE Assessment RLE Assessment: Exceptions to [facility] RLE Strength RLE Overall Strength Comments: grossly 4/5 LLE Assessment LLE Assessment: Within Functional Limits (adductors and hip flexors 4/5 grossly)  See FIM for current functional status Refer to Care Plan for Long Term Goals  Recommendations for other services: None  Discharge Criteria: Patient will be discharged from PT if patient refuses treatment 3 consecutive times without medical reason, if treatment goals not met, if there is a change in medical status, if patient makes no progress towards goals or if patient is discharged from hospital.  Treatment today: neuromuscular re-education via VCs, demo for R trunk and LE for bed mobility, scooting forward in armchair; dynamic sitting balance with close supervision for sock and shoe management.    The above assessment, treatment plan, treatment alternatives and goals were discussed and mutually agreed upon: by patient  Teckla Christiansen 03/20/2012, 10:33 AM

## 2012-03-20 NOTE — Care Management Note (Signed)
    Page 1 of 1   03/20/2012     8:04:21 AM   CARE MANAGEMENT NOTE 03/20/2012  Patient:  [name], [name]   Account Number:  [id number]  Date Initiated:  03/19/2012  Documentation initiated by:  Jacquelynn Cree  Subjective/Objective Assessment:   Admitted with cerebral embolism, cerebral infarction.     Action/Plan:   PT/OT evals  inpt rehab consult   Anticipated DC Date:  03/22/2012   Anticipated DC Plan:  IP REHAB FACILITY      DC Planning Services  CM consult      Choice offered to / List presented to:             Status of service:  Completed, signed off Medicare Important Message given?   (If response is "NO", the following Medicare IM given date fields will be blank) Date Medicare IM given:   Date Additional Medicare IM given:    Discharge Disposition:  IP REHAB FACILITY  Per UR Regulation:  Reviewed for med. necessity/level of care/duration of stay  If discussed at Long Length of Stay Meetings, dates discussed:    Comments:  03/20/12 Discharged to inpatient rehab.

## 2012-03-20 NOTE — Progress Notes (Signed)
Social Work Assessment and Plan Social Work Assessment and Plan  Patient Details  Name: [name] MRN: [id number] Date of Birth: 1925-01-27  Today's Date: 03/20/2012  Problem List:  Patient Active Problem List  Diagnosis  . Aortic stenosis, severe  . Mitral regurgitation, moderate to severe  . PAF (paroxysmal atrial fibrillation)  . Diabetes mellitus, insulin dependent (IDDM), controlled  . History of drug-induced hemolytic anemia quinine  . SSS (sick sinus syndrome) with medtronic pacemaker, placed 6 years ago, with micro dislodgement of atrial lead, though still paces--for CHB and paf  . UTI (lower urinary tract infection), curent and history of UTIs on Keflex daily  . CAD (coronary artery disease), cardiac cath   . Anemia of chronic disease  . Near syncope with AF/AT  . CKD (chronic kidney disease) stage 5, GFR less than 15 ml/min  . CHF (congestive heart failure)  . Chronic combined systolic and diastolic CHF, NYHA class 2, in combination with AS, MR  . Cerebral embolism with cerebral infarction  . Hemiplegia, unspecified, affecting dominant side  . Hyperlipidemia LDL goal < 70  . CVA (cerebral infarction)   Past Medical History:  Past Medical History  Diagnosis Date  . Diabetes mellitus, insulin dependent (IDDM), controlled   . Chronic kidney disease, stage 4, severely decreased GFR     likely related to diabetic and hypertensive nephropathy, no evidence of renal artery stenosis by duplex US in 2008  . Mitral valve disorder   . Pacemaker November 2007    s/p placement complete heart block status  . Near syncope 12/12/2011  . Aortic stenosis, moderate to severe 12/12/2011  . Chronic degenerative aortic valve disease 12/12/2011  . PAF (paroxysmal atrial fibrillation) 12/12/2011  . H/O hemolytic anemia due to drugs 12/12/2011  . SSS (sick sinus syndrome) with medtronic pacemaker, placed 6 years ago, with micro dislodgement of atrial lead, though still paces--for CHB and paf  12/12/2011  . UTI (lower urinary tract infection), curent and history of UTIs on Keflex daily 12/12/2011  . CAD (coronary artery disease), cardiac cath  12/12/2011  . Anemia, secondary to chronic disease 12/12/2011  . Pacemaker complications     Known chronic dysfunction of atrial lead with high pacing trhesholds but good sensing.  . Chronic combined systolic and diastolic CHF, NYHA class 2, in combination with AS, MR 12/15/2011  . Bleeding of eye retinal eye bleed   Past Surgical History:  Past Surgical History  Procedure Date  . Pacemaker insertion November 2007    Medtronic EnRhythm dual-chamber [id number]  . Cardiac catheterization 2005    60% stenosis of 1st diagnoal branch, 60-70% stenosis of posterolateral brach of distal RCA, preserved LV with EF 50-55%, diastolic dysfunction  . Eye surgery retinal eye bleed  . Abdominal hysterectomy    Social History:  reports that she quit smoking about 31 years ago. Her smoking use included Cigarettes. She has a 30 pack-year smoking history. She does not have any smokeless tobacco history on file. She reports that she does not drink alcohol or use illicit drugs.  Family / Support Systems Marital Status: Widow/Widower Patient Roles: Parent Children: Vernona Rieger -daughter  [phone]  [phone] Other Supports: Two sons in [place] Anticipated Caregiver: Daughter and possible cousin Ability/Limitations of Caregiver: May only have intermittent care during day and daughter there in the evenings. Caregiver Availability: Evenings only (May have cousin come for short time-daughter works days) Family Dynamics: Close knit family pt has lived with daughter since 2006.  Pt did well prior  to admission while daughter worked.  She wants to be independent again.  Social History Preferred language: English Religion: [employer] Cultural Background: No issues Education: Pt is an Retired Higher education careers adviser Read: Yes Write: Yes Employment Status: Retired Insurance account manager Issues: No issues Guardian/Conservator: None-according to MD pt is capable of making her own decisions   Abuse/Neglect Physical Abuse: Denies Verbal Abuse: Denies Sexual Abuse: Denies Exploitation of patient/patient's resources: Denies Self-Neglect: Denies  Emotional Status Pt's affect, behavior adn adjustment status: Pt is motivated and ready to work hard in therapies.  She wants to get back to her prior level of functioning.  She has always been independent. Recent Psychosocial Issues: Other medical issues Pyschiatric History: No history-deferred depression screen due to pt felt not necessary.  She reports: " I am doing ok with all of this, sometimes you expect this when you are old like me." Substance Abuse History: No issues  Patient / Family Perceptions, Expectations & Goals Pt/Family understanding of illness & functional limitations: Pt has a good understanding of her condition and deficits.  Her daughter is a Development worker, international aid NP and has assisted with pt's medical care since she moved here with her.  Pt is encouraged by the progress she has made thus far. Premorbid pt/family roles/activities: Mom, Retiree, grandmother, Church member, etc Anticipated changes in roles/activities/participation: Resume Pt/family expectations/goals: Pt states: " I want to take care of myself, I will get there."    [employer]: None Premorbid Home Care/DME Agencies: None Transportation available at discharge: [employer] referrals recommended: Support group (specify) ([facility] Support group)  Discharge Planning Living Arrangements: Children Support Systems: Children;Other relatives;Friends/neighbors;Church/faith community Type of Residence: Private residence Insurance Resources: Administrator (specify) Youth worker) Financial Resources: Tree surgeon;Family Support Financial Screen Referred: No Living Expenses: Lives with family Money  Management: Family Do you have any problems obtaining your medications?: No Home Management: Both Patient/Family Preliminary Plans: Return to daughter's home, unsure if cousin will come to stay with for a short time.  Daughter does work daytime.  One of the issue sis the number of stairs to access the home.  Need to begin working on this. Social Work Anticipated Follow Up Needs: HH/OP;Support Group  Clinical Impression Pleasant female who is motivated and willing to work.  Supportive family who is committed to pt.  Should be short length of stay here.  Lucy Chris 03/20/2012, 2:48 PM

## 2012-03-20 NOTE — Progress Notes (Signed)
Occupational Therapy Session Note  Patient Details  Name: Tillie Viverette MRN: [id number] Date of Birth: Oct 13, 1924  Today's Date: 03/20/2012 Time: 1130-1200 Time Calculation (min): 30 min  Short Term Goals: Week 1:  OT Short Term Goal 1 (Week 1): Short term goals equal LTGs secondary to estimated length of stay.  Skilled Therapeutic Interventions/Progress Updates:  Pt seen for 1:1 OT with focus on [facility] with focus on coordination and strengthening with use of yellow theraputty.  Provided pt with HEP handout with exercises with theraputty and [facility] and pt return demonstrated each activity.  Pt required increased time with in-hand manipulation and tip to tip pinch with removing beads from theraputty.  Pt reports increased function and control with first and second digits, but continued difficulty with third and fourth digits.  Encouraged pt to perform activities on HEP handout 2-3x/day to continue to focus on [facility].  Therapy Documentation Precautions:  Precautions Precautions: Fall Precaution Comments: has broken L ankle x 2 Restrictions Weight Bearing Restrictions: No General:   Vital Signs: Therapy Vitals Pulse Rate: 63  Patient Position, if appropriate: Sitting Oxygen Therapy SpO2: 98 % O2 Device: None (Room air) Pulse Oximetry Type: Intermittent Pain: Pain Assessment Pain Assessment: No/denies pain  See FIM for current functional status  Therapy/Group: Individual Therapy  Leonette Monarch 03/20/2012, 12:18 PM

## 2012-03-20 NOTE — Evaluation (Signed)
The assessment and plan has been reviewed and SLP is in agreement. Effa Yarrow, M.A., CCC-SLP [phone]  

## 2012-03-20 NOTE — Evaluation (Signed)
Occupational Therapy Assessment and Plan  Patient Details  Name: [name] MRN: [id number] Date of Birth: 02/20/1925  OT Diagnosis: abnormal posture, hemiplegia affecting dominant side and muscle weakness (generalized) Rehab Potential: Rehab Potential: Excellent ELOS: 7-10 days   Today's Date: 03/20/2012 Time: 0800-0900 Time Calculation (min): 60 min  Problem List:  Patient Active Problem List  Diagnosis  . Aortic stenosis, severe  . Mitral regurgitation, moderate to severe  . PAF (paroxysmal atrial fibrillation)  . Diabetes mellitus, insulin dependent (IDDM), controlled  . History of drug-induced hemolytic anemia quinine  . SSS (sick sinus syndrome) with medtronic pacemaker, placed 6 years ago, with micro dislodgement of atrial lead, though still paces--for CHB and paf  . UTI (lower urinary tract infection), curent and history of UTIs on Keflex daily  . CAD (coronary artery disease), cardiac cath   . Anemia of chronic disease  . Near syncope with AF/AT  . CKD (chronic kidney disease) stage 5, GFR less than 15 ml/min  . CHF (congestive heart failure)  . Chronic combined systolic and diastolic CHF, NYHA class 2, in combination with AS, MR  . Cerebral embolism with cerebral infarction  . Hemiplegia, unspecified, affecting dominant side  . Hyperlipidemia LDL goal < 70  . CVA (cerebral infarction)    Past Medical History:  Past Medical History  Diagnosis Date  . Diabetes mellitus, insulin dependent (IDDM), controlled   . Chronic kidney disease, stage 4, severely decreased GFR     likely related to diabetic and hypertensive nephropathy, no evidence of renal artery stenosis by duplex US in 2008  . Mitral valve disorder   . Pacemaker November 2007    s/p placement complete heart block status  . Near syncope 12/12/2011  . Aortic stenosis, moderate to severe 12/12/2011  . Chronic degenerative aortic valve disease 12/12/2011  . PAF (paroxysmal atrial fibrillation) 12/12/2011  . H/O  hemolytic anemia due to drugs 12/12/2011  . SSS (sick sinus syndrome) with medtronic pacemaker, placed 6 years ago, with micro dislodgement of atrial lead, though still paces--for CHB and paf 12/12/2011  . UTI (lower urinary tract infection), curent and history of UTIs on Keflex daily 12/12/2011  . CAD (coronary artery disease), cardiac cath  12/12/2011  . Anemia, secondary to chronic disease 12/12/2011  . Pacemaker complications     Known chronic dysfunction of atrial lead with high pacing trhesholds but good sensing.  . Chronic combined systolic and diastolic CHF, NYHA class 2, in combination with AS, MR 12/15/2011  . Bleeding of eye retinal eye bleed   Past Surgical History:  Past Surgical History  Procedure Date  . Pacemaker insertion November 2007    Medtronic EnRhythm dual-chamber [id number]  . Cardiac catheterization 2005    60% stenosis of 1st diagnoal branch, 60-70% stenosis of posterolateral brach of distal RCA, preserved LV with EF 50-55%, diastolic dysfunction  . Eye surgery retinal eye bleed  . Abdominal hysterectomy     Assessment & Plan Clinical Impression: Patient is a 78 y.o. year old female with recent admission to the hospital on 03/17/2012 with right-sided weakness and dizziness. Cranial CT scan was unremarkable for acute changes. She did receive TPA. MRI not completed secondary to pacemaker. Echocardiogram with normal systolic function and ejection fraction of 55%. Carotid Dopplers demonstrated 40-59% right ICA stenosis. EKG sinus tachycardia. Neurology services consulted for suspect left brain stroke and placed on Coumadin therapy with bridging of Lovenox.  Patient transferred to CIR on 03/19/2012 .    Patient currently requires min  with basic self-care skills and IADL secondary to muscle weakness and unbalanced muscle activation and decreased coordination.  Prior to hospitalization, patient could complete ADLS and IADLS with modified independence using her cane or  rollator..  Patient will benefit from skilled intervention to decrease level of assist with basic self-care skills, increase independence with basic self-care skills and increase level of independence with iADL prior to discharge home with daughter and intermittent supervision..  Anticipate patient will require intermittent supervision and follow up outpatient.  OT - End of Session Activity Tolerance: Tolerates 30+ min activity with multiple rests Endurance Deficit: Yes Endurance Deficit Description: pt with dyspnea of 2/4 with bathing and dressing.  Needed 3 rest breaks to complet the tasks.  She also voiced being fatigued as well. OT Assessment Rehab Potential: Excellent Barriers to Discharge: Decreased caregiver support Barriers to Discharge Comments: She needs to be modified independent since daughter is working. OT Plan OT Frequency: 1-2 X/day, 60-90 minutes Estimated Length of Stay: 7-10 days OT Treatment/Interventions: Balance/vestibular training;Community reintegration;Discharge planning;Pain management;Neuromuscular re-education;Functional mobility training;DME/adaptive equipment instruction;Patient/family education;Self Care/advanced ADL retraining;Psychosocial support;Therapeutic Activities;Visual/perceptual remediation/compensation;UE/LE Coordination activities;UE/LE Strength taining/ROM;Therapeutic Exercise OT Recommendation Follow Up Recommendations: Outpatient OT Equipment Recommended: Tub/shower bench  OT Evaluation Precautions/Restrictions  Precautions Precautions: Fall Precaution Comments: has broken L ankle x 2 Restrictions Weight Bearing Restrictions: No General   Vital Signs Therapy Vitals Temp: 97.9 F (36.6 C) Temp src: Oral Pulse Rate: 81  Resp: 18  BP: 129/79 mmHg Patient Position, if appropriate: Sitting Oxygen Therapy SpO2: 98 % O2 Device: None (Room air) Pulse Oximetry Type: Intermittent Pain Pain Assessment Pain Assessment: No/denies pain Home  Living/Prior Functioning Home Living Entrance Stairs-Number of Steps: 7 steps, then a landing, then 4 more steps Entrance Stairs-Rails: Left;Right;Can reach both Prior Function Level of Independence:  (used RW for longer distances indoors; SPC otherwise) Able to Take Stairs?: Reciprically Driving: No ADL ADL Eating: Supervision/safety Where Assessed-Eating: Edge of bed Grooming: Setup Where Assessed-Grooming: Chair;Sitting at sink;Standing at sink Upper Body Bathing: Setup Where Assessed-Upper Body Bathing: Shower Lower Body Bathing: Contact guard Where Assessed-Lower Body Bathing: Shower Upper Body Dressing: Setup Where Assessed-Upper Body Dressing: Chair;Sitting at sink;Standing at sink Lower Body Dressing: Contact guard Where Assessed-Lower Body Dressing: Standing at sink;Sitting at sink;Chair Toileting: Contact guard Where Assessed-Toileting: Teacher, adult education: Curator Method: Proofreader: Chiropractor Transfer: International aid/development worker Method: Ship broker: Insurance underwriter: Insurance underwriter Method: Designer, industrial/product: Emergency planning/management officer ADL Comments: Pt making great progress.  Demonstrates decreased FM control and strength in the RUE but uses functionally in a diminished fashion.  Needs min guard assist for sit to stand during selfcare tasks. Vision/Perception  Vision - History Baseline Vision: Wears glasses all the time Visual History: Corrective eye surgery;Other (comment) (pt reports history of blood on her eye?) Patient Visual Report: No change from baseline Vision - Assessment Eye Alignment: Within Functional Limits Vision Assessment: Vision tested Additional Comments: No noted visual deficits with gross testing.  Will continue to assess in the context of treatment. Perception Perception: Within  Functional Limits Praxis Praxis: Intact  Cognition Overall Cognitive Status: Appears within functional limits for tasks assessed Arousal/Alertness: Awake/alert Orientation Level: Oriented X4 Attention: Selective Focused Attention: Appears intact Sustained Attention: Appears intact Selective Attention: Appears intact Sensation Sensation Light Touch: Appears Intact (For bilateral UEs) Stereognosis: Appears Intact Hot/Cold: Appears Intact Proprioception: Appears Intact Coordination Gross Motor Movements are Fluid and  Coordinated: No Fine Motor Movements are Fluid and Coordinated: No Coordination and Movement Description: Pt with dysmetria with finger to nose testing in the RUE.  Slight decreased FM coordination as well. Motor  Motor Motor: Abnormal postural alignment and control Mobility  Transfers Sit to Stand: 4: Min guard;From bed;With upper extremity assist  Trunk/Postural Assessment  Cervical Assessment Cervical Assessment: Within Functional Limits Thoracic Assessment Thoracic Assessment: Exceptions to [facility] Thoracic Strength Overall Thoracic Strength Comments: slight kyphotic posturing in sitting and standing  Balance Balance Balance Assessed: Yes Static Standing Balance Static Standing - Balance Support: Right upper extremity supported;Left upper extremity supported Static Standing - Level of Assistance: 5: Stand by assistance Extremity/Trunk Assessment RUE Assessment RUE Assessment: Exceptions to [facility] RUE Strength RUE Overall Strength Comments: Pt with history of rotator cuff injury prior to CVA.  Now with increased weakness and decreased coordination compared to baseline.  Elbow flexion and extension 3+/5, grip 3+5, decresed ability to isolate and extend the 4th and 5th digits. LUE Assessment LUE Assessment: Within Functional Limits  See FIM for current functional status Refer to Care Plan for Long Term Goals  Recommendations for other services: None  Discharge  Criteria: Patient will be discharged from OT if patient refuses treatment 3 consecutive times without medical reason, if treatment goals not met, if there is a change in medical status, if patient makes no progress towards goals or if patient is discharged from hospital.  The above assessment, treatment plan, treatment alternatives and goals were discussed and mutually agreed upon: by patient  During session began working on selfcare retraining, balance, use of DME, and RUE coordination.  Pt needing 3 rest breaks to complete bathing and dressing tasks.  Utilized cane for transfers to the shower and back to the sink.  She attempted to hold onto the sink and doorway with the opposite hand.  Almin Livingstone OTR/L 03/20/2012, 9:18 AM

## 2012-03-21 ENCOUNTER — Inpatient Hospital Stay (HOSPITAL_COMMUNITY): Payer: Medicare Other

## 2012-03-21 ENCOUNTER — Inpatient Hospital Stay (HOSPITAL_COMMUNITY): Payer: Medicare Other | Admitting: Occupational Therapy

## 2012-03-21 ENCOUNTER — Inpatient Hospital Stay (HOSPITAL_COMMUNITY): Payer: Medicare Other | Admitting: Physical Therapy

## 2012-03-21 DIAGNOSIS — I6330 Cerebral infarction due to thrombosis of unspecified cerebral artery: Secondary | ICD-10-CM

## 2012-03-21 DIAGNOSIS — Z5189 Encounter for other specified aftercare: Secondary | ICD-10-CM

## 2012-03-21 DIAGNOSIS — G8110 Spastic hemiplegia affecting unspecified side: Secondary | ICD-10-CM

## 2012-03-21 LAB — GLUCOSE, CAPILLARY: Glucose-Capillary: 96 mg/dL (ref 70–99)

## 2012-03-21 LAB — HEMOGLOBIN A1C: Hgb A1c MFr Bld: 6 % — ABNORMAL HIGH (ref <5.7)

## 2012-03-21 MED ORDER — WARFARIN SODIUM 4 MG PO TABS
4.0000 mg | ORAL_TABLET | Freq: Once | ORAL | Status: AC
  Administered 2012-03-21: 4 mg via ORAL
  Filled 2012-03-21: qty 1

## 2012-03-21 NOTE — Progress Notes (Signed)
Patient ID: [name], female   DOB: 11/13/1924, 78 y.o.   MRN: [id number] [name] is a 78 y.o. right-handed female with history of diabetes mellitus with peripheral neuropathy, chronic kidney disease with baseline creatinine 2.3, PAF with pacemaker. Patient was independent prior to admission using a cane and sometimes a walker secondary to peripheral neuropathy and lives with her daughter who is a Publishing rights manager with a local cardiology group. Admitted 03/17/2012 with right-sided weakness and dizziness. Cranial CT scan was unremarkable for acute changes. She did receive TPA. MRI not completed secondary to pacemaker. Echocardiogram with normal systolic function and ejection fraction of 55%. Carotid Dopplers demonstrated 40-59% right ICA stenosis. EKG sinus tachycardia. Neurology services consulted for suspect left brain stroke and placed on Coumadin therapy with bridging of Lovenox. Subcutaneous Lovenox added for DVT prophylaxis.  Subjective/Complaints: Slept poorly but no specific reason.  Takes 2.5 mg ambien at home and sometimes repeats x 1  Objective: Vital Signs: Blood pressure 121/70, pulse 63, temperature 98.8 F (37.1 C), temperature source Oral, resp. rate 19, height 4\' 11"  (1.499 m), weight 63.8 kg (140 lb 10.5 oz), SpO2 96.00%. No results found. Results for orders placed during the hospital encounter of 03/19/12 (from the past 72 hour(s))  GLUCOSE, CAPILLARY     Status: Normal   Collection Time   03/19/12  4:53 PM      Component Value Range Comment   Glucose-Capillary 76  70 - 99 mg/dL   GLUCOSE, CAPILLARY     Status: Abnormal   Collection Time   03/19/12  9:00 PM      Component Value Range Comment   Glucose-Capillary 164 (*) 70 - 99 mg/dL   CBC WITH DIFFERENTIAL     Status: Abnormal   Collection Time   03/20/12  4:55 AM      Component Value Range Comment   WBC 6.8  4.0 - 10.5 K/uL    RBC 2.63 (*) 3.87 - 5.11 MIL/uL    Hemoglobin 8.0 (*) 12.0 - 15.0 g/dL    HCT 62.1  (*) 30.8 - 46.0 %    MCV 94.7  78.0 - 100.0 fL    MCH 30.4  26.0 - 34.0 pg    MCHC 32.1  30.0 - 36.0 g/dL    RDW 65.7  84.6 - 96.2 %    Platelets 213  150 - 400 K/uL    Neutrophils Relative 66  43 - 77 %    Neutro Abs 4.5  1.7 - 7.7 K/uL    Lymphocytes Relative 18  12 - 46 %    Lymphs Abs 1.2  0.7 - 4.0 K/uL    Monocytes Relative 11  3 - 12 %    Monocytes Absolute 0.7  0.1 - 1.0 K/uL    Eosinophils Relative 5  0 - 5 %    Eosinophils Absolute 0.4  0.0 - 0.7 K/uL    Basophils Relative 0  0 - 1 %    Basophils Absolute 0.0  0.0 - 0.1 K/uL   COMPREHENSIVE METABOLIC PANEL     Status: Abnormal   Collection Time   03/20/12  4:55 AM      Component Value Range Comment   Sodium 138  135 - 145 mEq/L    Potassium 3.9  3.5 - 5.1 mEq/L    Chloride 103  96 - 112 mEq/L    CO2 25  19 - 32 mEq/L    Glucose, Bld 101 (*) 70 - 99 mg/dL  BUN 54 (*) 6 - 23 mg/dL    Creatinine, Ser 4.54 (*) 0.50 - 1.10 mg/dL    Calcium 9.1  8.4 - 09.8 mg/dL    Total Protein 5.6 (*) 6.0 - 8.3 g/dL    Albumin 2.7 (*) 3.5 - 5.2 g/dL    AST 19  0 - 37 U/L    ALT 10  0 - 35 U/L    Alkaline Phosphatase 72  39 - 117 U/L    Total Bilirubin 0.2 (*) 0.3 - 1.2 mg/dL    GFR calc non Af Amer 19 (*) >90 mL/min    GFR calc Af Amer 22 (*) >90 mL/min   PROTIME-INR     Status: Normal   Collection Time   03/20/12  4:55 AM      Component Value Range Comment   Prothrombin Time 13.9  11.6 - 15.2 seconds    INR 1.08  0.00 - 1.49   HEMOGLOBIN A1C     Status: Abnormal   Collection Time   03/20/12  4:55 AM      Component Value Range Comment   Hemoglobin A1C 6.0 (*) <5.7 %    Mean Plasma Glucose 126 (*) <117 mg/dL   GLUCOSE, CAPILLARY     Status: Abnormal   Collection Time   03/20/12  7:17 AM      Component Value Range Comment   Glucose-Capillary 105 (*) 70 - 99 mg/dL    Comment 1 Notify RN     GLUCOSE, CAPILLARY     Status: Abnormal   Collection Time   03/20/12 11:42 AM      Component Value Range Comment   Glucose-Capillary  160 (*) 70 - 99 mg/dL    Comment 1 Notify RN     GLUCOSE, CAPILLARY     Status: Abnormal   Collection Time   03/20/12  4:26 PM      Component Value Range Comment   Glucose-Capillary 158 (*) 70 - 99 mg/dL   GLUCOSE, CAPILLARY     Status: Normal   Collection Time   03/20/12  8:46 PM      Component Value Range Comment   Glucose-Capillary 87  70 - 99 mg/dL      HEENT: normal Cardio: RRR Resp: CTA B/L GI: BS positive Extremity:  Pulses positive Skin:   Intact Neuro: Alert/Oriented, Cranial Nerve II-XII normal, Abnormal Sensory reduced sensation in bilateral feet, Abnormal Motor 4-/5 strength in the R delt,bi,tri,grip and Abnormal FMC Ataxic/ dec FMC Musc/Skel:  Other Hallux valgus GEN: NAD   Assessment/Plan: 1. Functional deficits secondary to L pontine embolic infarct not visualized on CT which require 3+ hours per day of interdisciplinary therapy in a comprehensive inpatient rehab setting. Physiatrist is providing close team supervision and 24 hour management of active medical problems listed below. Physiatrist and rehab team continue to assess barriers to discharge/monitor patient progress toward functional and medical goals. FIM: FIM - Bathing Bathing Steps Patient Completed: Chest;Right Arm;Left Arm;Abdomen;Front perineal area;Buttocks;Right upper leg;Left upper leg;Right lower leg (including foot);Left lower leg (including foot) Bathing: 4: Steadying assist  FIM - Upper Body Dressing/Undressing Upper body dressing/undressing steps patient completed: Thread/unthread right bra strap;Thread/unthread left bra strap;Hook/unhook bra;Thread/unthread right sleeve of pullover shirt/dresss;Pull shirt over trunk;Put head through opening of pull over shirt/dress;Thread/unthread left sleeve of pullover shirt/dress Upper body dressing/undressing: 5: Supervision: Safety issues/verbal cues FIM - Lower Body Dressing/Undressing Lower body dressing/undressing steps patient completed:  Thread/unthread right underwear leg;Thread/unthread left underwear leg;Pull underwear up/down;Thread/unthread right pants leg;Thread/unthread  left pants leg;Pull pants up/down;Fasten/unfasten pants;Don/Doff right sock;Don/Doff left sock;Don/Doff right shoe;Don/Doff left shoe;Fasten/unfasten left shoe;Fasten/unfasten right shoe Lower body dressing/undressing: 4: Steadying Assist  FIM - Toileting Toileting steps completed by patient: Adjust clothing prior to toileting;Performs perineal hygiene;Adjust clothing after toileting Toileting: 4: Steadying assist  FIM - Diplomatic Services operational officer Devices: Elevated toilet seat Toilet Transfers: 4-To toilet/BSC: Min A (steadying Pt. > 75%)  FIM - Banker Devices: Teacher, music: 5: Supine > Sit: Supervision (verbal cues/safety issues)  FIM - Locomotion: Wheelchair Distance: 40 Locomotion: Wheelchair: 1: Travels less than 50 ft with minimal assistance (Pt.>75%) FIM - Locomotion: Ambulation Ambulation/Gait Assistance: 3: Mod assist Locomotion: Ambulation: 2: Travels 50 - 149 ft with moderate assistance (Pt: 50 - 74%)  Comprehension Comprehension Mode: Auditory Comprehension: 7-Follows complex conversation/direction: With no assist  Expression Expression Mode: Verbal Expression: 7-Expresses complex ideas: With no assist  Social Interaction Social Interaction: 6-Interacts appropriately with others with medication or extra time (anti-anxiety, antidepressant).  Problem Solving Problem Solving: 6-Solves complex problems: With extra time  Memory Memory: 7-Complete Independence: No helper Medical Problem List and Plan:  1. Subcortical left brain infarcts  2. DVT Prophylaxis/Anticoagulation: Chronic Coumadin therapy with Lovenox for bridging until INR greater than 2.00  3. Pain Management: Ultram 50 mg every 12 hours as needed, Lidoderm patch. Monitor with increased mobility  4.  Neuropsych: This patient is capable of making decisions on his/her own behalf.  5. PAF/pacemaker. Amiodarone 200 mg daily. Cardiac rate control  6. Chronic kidney disease. Baseline creatinine 2.3. Continue Lasix 40 mg daily as well as Calcitrol 0.25 mcg every other day. Followup chemistries  7. Diabetes mellitus with peripheral neuropathy. Hemoglobin A1c 6.1. Continue Lantus insulin 16 units daily and NovoLog 8 units 3 times a day. Check blood sugars a.c. and at bedtime  8. CHF. Continue Lasix 40 mg daily and monitor for any signs of fluid overload  9. Chronic urinary tract infection. Keflex 250 mg daily prophylactically  10. Hyperlipidemia. Zetia/Zocor  11. Anemia of chronic disease. Latest hemoglobin 8.8. Monitor for any bleeding episodes and followup CBC 12.  Insomia, resume home dose ambien slept well  LOS (Days) 2 A FACE TO FACE EVALUATION WAS PERFORMED  [name] 03/21/2012, 7:22 AM

## 2012-03-21 NOTE — Progress Notes (Signed)
ANTICOAGULATION CONSULT NOTE - Follow Up Consult  Pharmacy Consult for Coumadin, s/p TPA infusion. Indication: CVA and PAF  Vital Signs: Temp: 98.8 F (37.1 C) (10/17 0500) Temp src: Oral (10/17 0500) BP: 107/60 mmHg (10/17 0929) Pulse Rate: 63  (10/17 0500)  Labs:  Basename 03/21/12 0705 03/20/12 0455 03/19/12 0550  HGB -- 8.0* 8.8*  HCT -- 24.9* 27.9*  PLT -- 213 243  APTT -- -- --  LABPROT 13.8 13.9 14.2  INR 1.07 1.08 1.11  HEPARINUNFRC -- -- --  CREATININE -- 2.19* --  CKTOTAL -- -- --  CKMB -- -- --  TROPONINI -- -- --    Estimated Creatinine Clearance: 15.2 ml/min (by C-G formula based on Cr of 2.19).  Assessment:78 y.o.female admitted for Cerebral embolism with cerebral infarction and atrial fibrillation, s/p TPA infusion. CT negative for hemorrhage. INR remains subtherapeutic at 1.07 today after 3 doses of coumadin. No bleeding noted. No CBC today.   Goal of Therapy:  INR around 2.5 Monitor platelets by anticoagulation protocol: Yes   Plan:  1. Repeat Coumadin 4mg  PO x 1 tonight 2. F/u AM INR  [name], [name] 03/21/2012,10:45 AM

## 2012-03-21 NOTE — Progress Notes (Signed)
Physical Therapy Session Note  Patient Details  Name: [name] MRN: [id number] Date of Birth: Apr 06, 1925  Today's Date: 03/21/2012 Time: 0905-1000 Time Calculation (min): 55 min  Short Term Goals: Week 1:  PT Short Term Goal 1 (Week 1): pt will perform all bed mobility modified independently PT Short Term Goal 2 (Week 1): pt will perform basic transfers with supervision PT Short Term Goal 3 (Week 1): pt will propel w/c x 150' with supervision for activity tolerance PT Short Term Goal 4 (Week 1): pt will perform gait x 150' with supervision with LRAD PT Short Term Goal 5 (Week 1): pt will ascend/descend 5 steps with superviison  Skilled Therapeutic Interventions/Progress Updates:   Gait training with RW x 100' x 2 on level tile, x 20' on carpet in home situation, supervision.  W/c>< mat; stand with RW> loveseat, armless chair at kitchen table , all  stand pivot with supervision    neuromuscular re-education via visual feedback, VCs:  In sitting for RLE hip flexion and R knee extension, = movements to LLE In sitting for trunk shortening/lengthening with reaching L and R , and forward to facilitate balance reactions, wt shifting. In standing during calf raises x 10, toe up promoting ankle strategy for LOB backwards, 5/6 trials.  Pt demonstrated hip strategy 1/6 after discussion of balance strategies. In sitting, to scoot an armless chair up to /away from, using trunk and bil LEs.  Pt required intermittent seated rest breaks due to dyspnea 3/4 with mild exertion.  Therapy Documentation Precautions:  Precautions Precautions: Fall Precaution Comments: has broken L ankle x 2 Restrictions Weight Bearing Restrictions: No   Vital Signs: Therapy Vitals BP: 107/60 mmHg Oxygen Therapy SpO2: 96 % O2 Device: None (Room air) Pain: Pain Assessment Pain Assessment: 0-10 Pain Score:   4 Pain Type: Acute pain Pain Location: Back Pain Descriptors: Aching Pain Frequency:  Occasional Pain Onset: Gradual Patients Stated Pain Goal: 0 Pain Intervention(s): Medication (See eMAR) (tylenol)    See FIM for current functional status  Therapy/Group: Individual Therapy  Teaghan Melrose 03/21/2012, 11:03 AM

## 2012-03-21 NOTE — Progress Notes (Signed)
Social Work Patient ID: [name], female   DOB: 11/12/1924, 78 y.o.   MRN: [id number] Met with pt and daughter who was here visiting to discuss team conference and discharge.  Daughter will have cousin come and stay with a short time At discharge.  Daughter will be with over the weekend.  Pt reports this therapy is tiring her out, but in a good way.  She did sleep well last night which she is glad About.  Both pleased with her progress and will work toward discharge next Friday.

## 2012-03-21 NOTE — Patient Care Conference (Signed)
Inpatient RehabilitationTeam Conference Note Date: 03/20/2012   Time: 11:00 AM    Patient Name: [name]      Medical Record Number: [id number]  Date of Birth: Aug 11, 1924 Sex: Female         Room/Bed: 4033/4033-01 Payor Info: Payor: MEDICARE  Plan: MEDICARE PART A AND B  Product Type: *No Product type*     Admitting Diagnosis: LT CVA  Admit Date/Time:  03/19/2012  3:34 PM Admission Comments: No comment available   Primary Diagnosis:  CVA (cerebral infarction) Principal Problem: CVA (cerebral infarction)  Patient Active Problem List   Diagnosis Date Noted  . CVA (cerebral infarction) 03/20/2012  . Hyperlipidemia LDL goal < 70 03/18/2012  . Cerebral embolism with cerebral infarction 03/17/2012  . Hemiplegia, unspecified, affecting dominant side 03/17/2012  . CHF (congestive heart failure) 12/15/2011  . Chronic combined systolic and diastolic CHF, NYHA class 2, in combination with AS, MR 12/15/2011  . CKD (chronic kidney disease) stage 5, GFR less than 15 ml/min 12/14/2011  . Near syncope with AF/AT 12/13/2011  . Aortic stenosis, severe 12/12/2011  . Mitral regurgitation, moderate to severe 12/12/2011  . PAF (paroxysmal atrial fibrillation) 12/12/2011    Class: Chronic  . Diabetes mellitus, insulin dependent (IDDM), controlled 12/12/2011    Class: Chronic  . History of drug-induced hemolytic anemia quinine 12/12/2011    Class: History of  . SSS (sick sinus syndrome) with medtronic pacemaker, placed 6 years ago, with micro dislodgement of atrial lead, though still paces--for CHB and paf 12/12/2011  . UTI (lower urinary tract infection), curent and history of UTIs on Keflex daily 12/12/2011  . CAD (coronary artery disease), cardiac cath  12/12/2011  . Anemia of chronic disease 12/12/2011    Class: Chronic    Expected Discharge Date: Expected Discharge Date: 03/29/12  Team Members Present: Physician: Dr. Claudette Laws Social Worker Present: Dossie Der, LCSW Nurse  Present: Carmie End, RN PT Present: Edman Circle, Lillie Columbia, PT OT Present: Bretta Bang, Verlene Mayer, OT SLP Present: Fae Pippin, SLP Other (Discipline and Name): Charolette Child Coordinator     Current Status/Progress Goal Weekly Team Focus  Medical   poor sleep, bowel dysregulation, multiple medical co morbid conditions  improve sleep and bowels  resume home meds for above   Bowel/Bladder   Continent of bowel and bladder.  Remains continent of bowel and bladder.  Monitor for signs and symptoms  of constipation and urinary  incontinence .   Swallow/Nutrition/ Hydration   eval pending         ADL's             Mobility             Communication   eval pending          Safety/Cognition/ Behavioral Observations  eval pending          Pain   Patient c/o pain to  bilateral feet. Relieved by Tylenol 650 mg.Tramadol 50 mg PRN  Pain level less than 3 on scale of 0-10.  Monitor for new onset of  pain and assess effectiveness of intervention and modify as needed.   Skin   n/a  No new skin breakdown or infection.  Monitor for any new skin breakdown q shift.      *See Interdisciplinary Assessment and Plan and progress notes for long and short-term goals  Barriers to Discharge: daughter works     Possible Resolutions to Barriers:  assess and discuss need for assist post D/C  Discharge Planning/Teaching Needs:  Home with daughter there in the evenings and possibly cousin to come stay short time if necessary.  Encouraged by the recovery she has so far.      Team Discussion:  Speech to look at diet.  Pt has balance issues and hx of falls.  Mod/i level goals, except for steps.  Revisions to Treatment Plan:  New Eval   Continued Need for Acute Rehabilitation Level of Care: The patient requires daily medical management by a physician with specialized training in physical medicine and rehabilitation for the following conditions: Daily direction of a multidisciplinary  physical rehabilitation program to ensure safe treatment while eliciting the highest outcome that is of practical value to the patient.: Yes Daily medical management of patient stability for increased activity during participation in an intensive rehabilitation regime.: Yes Daily analysis of laboratory values and/or radiology reports with any subsequent need for medication adjustment of medical intervention for : Neurological problems;Cardiac problems;Other  [name], [name] 03/22/2012, 11:45 AM

## 2012-03-21 NOTE — Progress Notes (Signed)
Occupational Therapy Session Note  Patient Details  Name: [name] MRN: [id number] Date of Birth: 14-Apr-1925  Today's Date: 03/21/2012 Time: 0800-0900 Time Calculation (min): 60 min  Short Term Goals: Week 1:  OT Short Term Goal 1 (Week 1): Short term goals equal LTGs secondary to estimated length of stay.  Skilled Therapeutic Interventions/Progress Updates:    Pt worked on self feeding to finish breakfast at the beginning of the session.  Issued red foam grips and encouraged pt to use them over the utensils and to use her dominant RUE as much as possible.  Pt declined bathing today since she showered yesterday.  Worked on dressing sit to stand at the sink with overall supervision.  Utilized walker to ambulate to the therapy gym with close supervision.  She had to take one rest break at the elevators before reaching the gym.  In the gym worked on dowel rod exercises for shoulder flexion 3 set of 5 repetitions.  Also performed one set of 20 repetitions for elbow flexion using the cane as well.  Progressed to using the left hand to manipulate small pegs and take out and replace into grid.  Varied technique from just picking up one peg to being able to manipulate 2 pegs in her hand and place one at a time.  She demonstrated moderate difficulty manipulating 2 pegs in hand.  Therapy Documentation Precautions:  Precautions Precautions: None Precaution Comments: has broken L ankle x 2 Restrictions Weight Bearing Restrictions: No   Pain: No report of pain during session.  ADL: See FIM for current functional status  Therapy/Group: Individual Therapy  Blain Hunsucker OTR/L 03/21/2012, 12:05 PM

## 2012-03-21 NOTE — Progress Notes (Signed)
Physical Therapy Session Note  Patient Details  Name: [name] MRN: [id number] Date of Birth: 1925/06/01  Today's Date: 03/21/2012 Time: 1302-1330 Time Calculation (min): 28 min  Short Term Goals: Week 1:  PT Short Term Goal 1 (Week 1): pt will perform all bed mobility modified independently PT Short Term Goal 2 (Week 1): pt will perform basic transfers with supervision PT Short Term Goal 3 (Week 1): pt will propel w/c x 150' with supervision for activity tolerance PT Short Term Goal 4 (Week 1): pt will perform gait x 150' with supervision with LRAD PT Short Term Goal 5 (Week 1): pt will ascend/descend 5 steps with superviison  Skilled Therapeutic Interventions/Progress Updates:   See below.  Overall, pt tolerated tx well, became SOB easily, but would push herself before resting.   Therapy Documentation Precautions:  Precautions Precautions: None Precaution Comments: has broken L ankle x 2 Restrictions Weight Bearing Restrictions: No Pain:  No pain Mobility:  Sit to stands with supervision Locomotion :  Gait training with RW x 80' with min@, decreased speed. Balance:  Dynamic standing balance reaching for objects lateral and posterior and placing forward overhead, with min@.  Toe tapping targets while in single limb stance with min@. Kinetron in standing on 10 cm2 addressing equal weight bearing on bilateral LEs progressed to having pt reach with one hand and then holding a ball with both hands and passing in less than 6", all performed with mod@ overall, due to LOB and needing assist to recover.  See FIM for current functional status  Therapy/Group: Individual Therapy  Georges Mouse 03/21/2012, 3:13 PM

## 2012-03-21 NOTE — Progress Notes (Signed)
Occupational Therapy Session Note  Patient Details  Name: [name] MRN: [id number] Date of Birth: 04-08-1925  Today's Date: 03/21/2012 Time: 8295-6213 Time Calculation (min): 46 min  Skilled Therapeutic Interventions/Progress Updates:    Worked on simple home management task of making a bed using her RW.  Pt's son present for session and supportive.  Pt needed mod instructional cueing to stay inside of the walker when reaching to straighten out the sheets instead of pushing the walker to the side.  Pt needed 2-3 rest breaks in order to complete the task.  Integrated use of the RUE for all tasks as well without difficulty.  Therapy Documentation Precautions:  Precautions Precautions: None Precaution Comments: has broken L ankle x 2 Restrictions Weight Bearing Restrictions: No  Pain: Pain Assessment Pain Assessment: No/denies pain ADL: See FIM for current functional status  Therapy/Group: Individual Therapy  Jayin Derousse OTR/L 03/21/2012, 3:23 PM

## 2012-03-22 ENCOUNTER — Encounter (HOSPITAL_COMMUNITY): Payer: Medicare Other | Admitting: Occupational Therapy

## 2012-03-22 ENCOUNTER — Inpatient Hospital Stay (HOSPITAL_COMMUNITY): Payer: Medicare Other

## 2012-03-22 ENCOUNTER — Inpatient Hospital Stay (HOSPITAL_COMMUNITY): Payer: Medicare Other | Admitting: Occupational Therapy

## 2012-03-22 LAB — PROTIME-INR
INR: 1.02 (ref 0.00–1.49)
Prothrombin Time: 13.3 seconds (ref 11.6–15.2)

## 2012-03-22 LAB — GLUCOSE, CAPILLARY

## 2012-03-22 MED ORDER — WARFARIN SODIUM 5 MG PO TABS
5.0000 mg | ORAL_TABLET | Freq: Once | ORAL | Status: AC
  Administered 2012-03-22: 5 mg via ORAL
  Filled 2012-03-22: qty 1

## 2012-03-22 NOTE — Progress Notes (Signed)
Physical Therapy Session Note  Patient Details  Name: [name] MRN: [id number] Date of Birth: [birth year]  Today's Date: 03/22/2012 Time: 0905-1000 and 4742-5956 Time Calculation (min): 55 min and 43 min  Short Term Goals: Week 1:  PT Short Term Goal 1 (Week 1): pt will perform all bed mobility modified independently PT Short Term Goal 2 (Week 1): pt will perform basic transfers with supervision PT Short Term Goal 3 (Week 1): pt will propel w/c x 150' with supervision for activity tolerance PT Short Term Goal 4 (Week 1): pt will perform gait x 150' with supervision with LRAD PT Short Term Goal 5 (Week 1): pt will ascend/descend 5 steps with superviison  Skilled Therapeutic Interventions/Progress Updates:     1st tx- neuromuscular re-education via forced use, verbal and visual cues:  - in standing with LUE support, while kicking a light wt ball, to facilitate R hip, knee and DF for better clearance of R foot in gait In standing with BUE support fading to no UE support, during toe taps onto floor, R and L foot forward and across.  Pt with LOB backwards and L during R toe taps, 30% of trials   Gait training with RW x 32' with supervision, retrieving rings with R and L hand and placing in walker bag, with VCs to scan the environment.  W/c mobility using bil UEs x 100' for increasing activity tolerance, with supervision.  Gait with 4WW x 4' with supervision, VCs to use brakes, which pt never used before.  2nd tx-  Reviewed use of 4WW ; son brought pt's from home: front wheels swivel; back wheels non swivel, use of brakes during sit>< stand.  neuromuscular re-education via forced use, visual cues, VCs: - for hip abduction to kick lt wt ball in supported standing, 10 x 1 each - for R hamstrings working on coordinated movements during R knee flexion in supported standing -sit>< stand x 5, without use of UEs to target eccentric control, with supervision, x 5 holding football between  knees for = wt bearing bil LEs with supervision -for high level gait: box step with HHA for wt shifting in all directions, with frequent LOB backwards and to R, requiring assistance to prevent fall  Ascended/descended 5 steps x 2 with bil rails, with supervision, alternating feet when ascending, non-alternating when descending.  Gait x 30' forward and backward with personal [phone] with supervision, VCs to slow down backwards  Pt required multiple seated rest breaks for dyspnea 3/4.    Therapy Documentation Precautions:  Precautions Precautions: Fall Precaution Comments: has broken L ankle x 2 Restrictions Weight Bearing Restrictions: No  Pain Assessment Pain Assessment: No/denies pain    See FIM for current functional status  Therapy/Group: Individual Therapy  [name] 03/22/2012, 12:08 PM

## 2012-03-22 NOTE — Progress Notes (Signed)
Occupational Therapy Session Note  Patient Details  Name: [name] MRN: [id number] Date of Birth: 1924-08-08  Today's Date: 03/22/2012 Time: 0800-0855 Time Calculation (min): 55 min  Short Term Goals: Week 1:  OT Short Term Goal 1 (Week 1): Short term goals equal LTGs secondary to estimated length of stay.  Skilled Therapeutic Interventions/Progress Updates:    Bathing and dressing session sit to stand.  Able to perform all bathing and dressing with supervision.  Able to sit on shower seat.  Needs min instructional cueing for hand placement with sit to stand.  She tens to keep hold onto the walker when getting up and when sitting down.  Dyspnea 3/4 during session.  Oxygen sats checked at 97 % with HR at 80 BPM.  Pt more fatigued this am than yesterday however.  Progressed to FM coordination activity of turning pages in the phone book with her right hand.  Demonstrated slight difficulty with this activity but was able to complete both turning pages forward and reverse.  Therapy Documentation Precautions:  Precautions Precautions: None Precaution Comments: has broken L ankle x 2 Restrictions Weight Bearing Restrictions: No  Pain: Pain Assessment Pain Assessment: No/denies pain ADL: See FIM for current functional status  Therapy/Group: Individual Therapy  Djuna Frechette OTR/L 03/22/2012, 8:55 AM

## 2012-03-22 NOTE — Progress Notes (Signed)
Patient ID: [name], female   DOB: February 21, 1925, 78 y.o.   MRN: [id number] [name] is a 78 y.o. right-handed female with history of diabetes mellitus with peripheral neuropathy, chronic kidney disease with baseline creatinine 2.3, PAF with pacemaker. Patient was independent prior to admission using a cane and sometimes a walker secondary to peripheral neuropathy and lives with her daughter who is a Publishing rights manager with a local cardiology group. Admitted 03/17/2012 with right-sided weakness and dizziness. Cranial CT scan was unremarkable for acute changes. She did receive TPA. MRI not completed secondary to pacemaker. Echocardiogram with normal systolic function and ejection fraction of 55%. Carotid Dopplers demonstrated 40-59% right ICA stenosis. EKG sinus tachycardia. Neurology services consulted for suspect left brain stroke and placed on Coumadin therapy with bridging of Lovenox. Subcutaneous Lovenox added for DVT prophylaxis.  Subjective/Complaints: Slept poorly but no specific reason.  Takes 2.5 mg ambien at home and sometimes repeats x 1  Objective: Vital Signs: Blood pressure 122/69, pulse 60, temperature 98 F (36.7 C), temperature source Oral, resp. rate 20, height 4\' 11"  (1.499 m), weight 63.8 kg (140 lb 10.5 oz), SpO2 100.00%. No results found. Results for orders placed during the hospital encounter of 03/19/12 (from the past 72 hour(s))  GLUCOSE, CAPILLARY     Status: Normal   Collection Time   03/19/12  4:53 PM      Component Value Range Comment   Glucose-Capillary 76  70 - 99 mg/dL   GLUCOSE, CAPILLARY     Status: Abnormal   Collection Time   03/19/12  9:00 PM      Component Value Range Comment   Glucose-Capillary 164 (*) 70 - 99 mg/dL   CBC WITH DIFFERENTIAL     Status: Abnormal   Collection Time   03/20/12  4:55 AM      Component Value Range Comment   WBC 6.8  4.0 - 10.5 K/uL    RBC 2.63 (*) 3.87 - 5.11 MIL/uL    Hemoglobin 8.0 (*) 12.0 - 15.0 g/dL    HCT 40.9  (*) 81.1 - 46.0 %    MCV 94.7  78.0 - 100.0 fL    MCH 30.4  26.0 - 34.0 pg    MCHC 32.1  30.0 - 36.0 g/dL    RDW 91.4  78.2 - 95.6 %    Platelets 213  150 - 400 K/uL    Neutrophils Relative 66  43 - 77 %    Neutro Abs 4.5  1.7 - 7.7 K/uL    Lymphocytes Relative 18  12 - 46 %    Lymphs Abs 1.2  0.7 - 4.0 K/uL    Monocytes Relative 11  3 - 12 %    Monocytes Absolute 0.7  0.1 - 1.0 K/uL    Eosinophils Relative 5  0 - 5 %    Eosinophils Absolute 0.4  0.0 - 0.7 K/uL    Basophils Relative 0  0 - 1 %    Basophils Absolute 0.0  0.0 - 0.1 K/uL   COMPREHENSIVE METABOLIC PANEL     Status: Abnormal   Collection Time   03/20/12  4:55 AM      Component Value Range Comment   Sodium 138  135 - 145 mEq/L    Potassium 3.9  3.5 - 5.1 mEq/L    Chloride 103  96 - 112 mEq/L    CO2 25  19 - 32 mEq/L    Glucose, Bld 101 (*) 70 - 99 mg/dL  BUN 54 (*) 6 - 23 mg/dL    Creatinine, Ser 1.61 (*) 0.50 - 1.10 mg/dL    Calcium 9.1  8.4 - 09.6 mg/dL    Total Protein 5.6 (*) 6.0 - 8.3 g/dL    Albumin 2.7 (*) 3.5 - 5.2 g/dL    AST 19  0 - 37 U/L    ALT 10  0 - 35 U/L    Alkaline Phosphatase 72  39 - 117 U/L    Total Bilirubin 0.2 (*) 0.3 - 1.2 mg/dL    GFR calc non Af Amer 19 (*) >90 mL/min    GFR calc Af Amer 22 (*) >90 mL/min   PROTIME-INR     Status: Normal   Collection Time   03/20/12  4:55 AM      Component Value Range Comment   Prothrombin Time 13.9  11.6 - 15.2 seconds    INR 1.08  0.00 - 1.49   HEMOGLOBIN A1C     Status: Abnormal   Collection Time   03/20/12  4:55 AM      Component Value Range Comment   Hemoglobin A1C 6.0 (*) <5.7 %    Mean Plasma Glucose 126 (*) <117 mg/dL   GLUCOSE, CAPILLARY     Status: Abnormal   Collection Time   03/20/12  7:17 AM      Component Value Range Comment   Glucose-Capillary 105 (*) 70 - 99 mg/dL    Comment 1 Notify RN     GLUCOSE, CAPILLARY     Status: Abnormal   Collection Time   03/20/12 11:42 AM      Component Value Range Comment   Glucose-Capillary  160 (*) 70 - 99 mg/dL    Comment 1 Notify RN     GLUCOSE, CAPILLARY     Status: Abnormal   Collection Time   03/20/12  4:26 PM      Component Value Range Comment   Glucose-Capillary 158 (*) 70 - 99 mg/dL   GLUCOSE, CAPILLARY     Status: Normal   Collection Time   03/20/12  8:46 PM      Component Value Range Comment   Glucose-Capillary 87  70 - 99 mg/dL   PROTIME-INR     Status: Normal   Collection Time   03/21/12  7:05 AM      Component Value Range Comment   Prothrombin Time 13.8  11.6 - 15.2 seconds    INR 1.07  0.00 - 1.49   GLUCOSE, CAPILLARY     Status: Normal   Collection Time   03/21/12  7:13 AM      Component Value Range Comment   Glucose-Capillary 99  70 - 99 mg/dL    Comment 1 Notify RN     GLUCOSE, CAPILLARY     Status: Abnormal   Collection Time   03/21/12 11:24 AM      Component Value Range Comment   Glucose-Capillary 211 (*) 70 - 99 mg/dL    Comment 1 Notify RN     GLUCOSE, CAPILLARY     Status: Normal   Collection Time   03/21/12  4:42 PM      Component Value Range Comment   Glucose-Capillary 96  70 - 99 mg/dL   GLUCOSE, CAPILLARY     Status: Abnormal   Collection Time   03/21/12  8:59 PM      Component Value Range Comment   Glucose-Capillary 158 (*) 70 - 99 mg/dL    Comment 1 Notify  RN        HEENT: normal Cardio: RRR Resp: CTA B/L GI: BS positive Extremity:  Pulses positive Skin:   Intact Neuro: Alert/Oriented, Cranial Nerve II-XII normal, Abnormal Sensory reduced sensation in bilateral feet, Abnormal Motor 4-/5 strength in the R delt,bi,tri,grip and Abnormal FMC Ataxic/ dec FMC Musc/Skel:  Other Hallux valgus GEN: NAD   Assessment/Plan: 1. Functional deficits secondary to L pontine embolic infarct not visualized on CT which require 3+ hours per day of interdisciplinary therapy in a comprehensive inpatient rehab setting. Physiatrist is providing close team supervision and 24 hour management of active medical problems listed below. Physiatrist  and rehab team continue to assess barriers to discharge/monitor patient progress toward functional and medical goals. FIM: FIM - Bathing Bathing Steps Patient Completed: Chest;Right Arm;Left Arm;Abdomen;Front perineal area;Buttocks;Right upper leg;Left upper leg;Right lower leg (including foot);Left lower leg (including foot) Bathing: 0: Activity did not occur  FIM - Upper Body Dressing/Undressing Upper body dressing/undressing steps patient completed: Thread/unthread right bra strap;Thread/unthread left bra strap;Hook/unhook bra;Thread/unthread right sleeve of pullover shirt/dresss;Pull shirt over trunk;Put head through opening of pull over shirt/dress;Thread/unthread left sleeve of pullover shirt/dress Upper body dressing/undressing: 5: Supervision: Safety issues/verbal cues FIM - Lower Body Dressing/Undressing Lower body dressing/undressing steps patient completed: Thread/unthread right pants leg;Thread/unthread left pants leg;Pull pants up/down;Don/Doff right sock;Don/Doff left sock;Don/Doff left shoe;Don/Doff right shoe;Fasten/unfasten left shoe;Fasten/unfasten right shoe Lower body dressing/undressing: 5: Supervision: Safety issues/verbal cues  FIM - Toileting Toileting steps completed by patient: Adjust clothing prior to toileting;Performs perineal hygiene;Adjust clothing after toileting Toileting: 4: Steadying assist  FIM - Diplomatic Services operational officer Devices: Elevated toilet seat Toilet Transfers: 4-To toilet/BSC: Min A (steadying Pt. > 75%)  FIM - Banker Devices: Teacher, music: 4: Bed > Chair or W/C: Min A (steadying Pt. > 75%)  FIM - Locomotion: Wheelchair Distance: 40 Locomotion: Wheelchair: 1: Total Assistance/staff pushes wheelchair (Pt<25%) FIM - Locomotion: Ambulation Locomotion: Ambulation Assistive Devices: Designer, industrial/product Ambulation/Gait Assistance: 3: Mod assist Locomotion: Ambulation: 2: Travels 50  - 149 ft with minimal assistance (Pt.>75%)  Comprehension Comprehension Mode: Auditory Comprehension: 7-Follows complex conversation/direction: With no assist  Expression Expression Mode: Verbal Expression: 7-Expresses complex ideas: With no assist  Social Interaction Social Interaction: 6-Interacts appropriately with others with medication or extra time (anti-anxiety, antidepressant).  Problem Solving Problem Solving: 6-Solves complex problems: With extra time  Memory Memory: 7-Complete Independence: No helper Medical Problem List and Plan:  1. Subcortical left brain infarcts  2. DVT Prophylaxis/Anticoagulation: Chronic Coumadin therapy with Lovenox for bridging until INR greater than 2.00  3. Pain Management: Ultram 50 mg every 12 hours as needed, Lidoderm patch. Monitor with increased mobility  4. Neuropsych: This patient is capable of making decisions on his/her own behalf.  5. PAF/pacemaker. Amiodarone 200 mg daily. Cardiac rate control  6. Chronic kidney disease. Baseline creatinine 2.3. Continue Lasix 40 mg daily as well as Calcitrol 0.25 mcg every other day. Followup chemistries  7. Diabetes mellitus with peripheral neuropathy. Hemoglobin A1c 6.1. Continue Lantus insulin 16 units daily and NovoLog 8 units 3 times a day. Check blood sugars a.c. and at bedtime  8. CHF. Continue Lasix 40 mg daily and monitor for any signs of fluid overload  9. Chronic urinary tract infection. Keflex 250 mg daily prophylactically  10. Hyperlipidemia. Zetia/Zocor  11. Anemia of chronic disease. Latest hemoglobin 8.8. Monitor for any bleeding episodes and followup CBC 12.  Insomia, resume home dose ambien slept well  LOS (Days) 3  A FACE TO FACE EVALUATION WAS PERFORMED  Kong Packett E 03/22/2012, 7:37 AM

## 2012-03-22 NOTE — Progress Notes (Signed)
ANTICOAGULATION CONSULT NOTE - Follow Up Consult  Pharmacy Consult for Coumadin, s/p TPA infusion. Indication: CVA and PAF  Vital Signs: Temp: 98.2 F (36.8 C) (10/18 1521) Temp src: Oral (10/18 1521) BP: 154/80 mmHg (10/18 1521) Pulse Rate: 60  (10/18 1521)  Labs:  Basename 03/22/12 0710 03/21/12 0705 03/20/12 0455  HGB -- -- 8.0*  HCT -- -- 24.9*  PLT -- -- 213  APTT -- -- --  LABPROT 13.3 13.8 13.9  INR 1.02 1.07 1.08  HEPARINUNFRC -- -- --  CREATININE -- -- 2.19*  CKTOTAL -- -- --  CKMB -- -- --  TROPONINI -- -- --    Estimated Creatinine Clearance: 15.2 ml/min (by C-G formula based on Cr of 2.19).  Assessment:78 y.o.female admitted for Cerebral embolism with cerebral infarction and atrial fibrillation, s/p TPA infusion. CT negative for hemorrhage. INR remains subtherapeutic at 1.02 today after 4 doses of coumadin. No bleeding noted. No CBC today.   Goal of Therapy:  INR around 2.5 Monitor platelets by anticoagulation protocol: Yes   Plan:  1.  Coumadin 5 mg PO x 1 tonight 2. F/u AM INR 3. Aspirin 81 mg to be stopped once INR > 2 Herby Abraham, Pharm.D. [phone] 03/22/2012 6:30 PM

## 2012-03-22 NOTE — Discharge Summary (Signed)
Stroke Discharge Summary  Patient ID: [name]   MRN: [id number]      DOB: January 04, 1925  Date of Admission: 03/17/2012 Date of Discharge: 03/22/2012  Attending Physician:  Darcella Cheshire, MD, Stroke MD  Consulting Physician(s):  Treatment Team:  [facility] Stroke, MD rehabilitation medicine Dr.Swartz  Patient's PCP:  Ginette Otto, MD  Discharge Diagnoses:  Principal Problem:  *Cerebral embolism with cerebral infarction Active Problems:  PAF (paroxysmal atrial fibrillation)  Diabetes mellitus, insulin dependent (IDDM), controlled  UTI (lower urinary tract infection), curent and history of UTIs on Keflex daily  CKD (chronic kidney disease) stage 5, GFR less than 15 ml/min  Hemiplegia, unspecified, affecting dominant side  Hyperlipidemia LDL goal < 70  BMI  Body mass index is 28.76 kg/(m^2).  Past Medical History  Diagnosis Date  . Diabetes mellitus, insulin dependent (IDDM), controlled   . Chronic kidney disease, stage 4, severely decreased GFR     likely related to diabetic and hypertensive nephropathy, no evidence of renal artery stenosis by duplex US in 2008  . Mitral valve disorder   . Pacemaker November 2007    s/p placement complete heart block status  . Near syncope 12/12/2011  . Aortic stenosis, moderate to severe 12/12/2011  . Chronic degenerative aortic valve disease 12/12/2011  . PAF (paroxysmal atrial fibrillation) 12/12/2011  . H/O hemolytic anemia due to drugs 12/12/2011  . SSS (sick sinus syndrome) with medtronic pacemaker, placed 6 years ago, with micro dislodgement of atrial lead, though still paces--for CHB and paf 12/12/2011  . UTI (lower urinary tract infection), curent and history of UTIs on Keflex daily 12/12/2011  . CAD (coronary artery disease), cardiac cath  12/12/2011  . Anemia, secondary to chronic disease 12/12/2011  . Pacemaker complications     Known chronic dysfunction of atrial lead with high pacing trhesholds but good sensing.  . Chronic combined  systolic and diastolic CHF, NYHA class 2, in combination with AS, MR 12/15/2011  . Bleeding of eye retinal eye bleed   Past Surgical History  Procedure Date  . Pacemaker insertion November 2007    Medtronic EnRhythm dual-chamber [id number]  . Cardiac catheterization 2005    60% stenosis of 1st diagnoal branch, 60-70% stenosis of posterolateral brach of distal RCA, preserved LV with EF 50-55%, diastolic dysfunction  . Eye surgery retinal eye bleed  . Abdominal hysterectomy     Medications to be continued on Rehab     LABORATORY STUDIES CBC    Component Value Date/Time   WBC 6.8 03/20/2012 0455   RBC 2.63* 03/20/2012 0455   HGB 8.0* 03/20/2012 0455   HCT 24.9* 03/20/2012 0455   PLT 213 03/20/2012 0455   MCV 94.7 03/20/2012 0455   MCH 30.4 03/20/2012 0455   MCHC 32.1 03/20/2012 0455   RDW 14.1 03/20/2012 0455   LYMPHSABS 1.2 03/20/2012 0455   MONOABS 0.7 03/20/2012 0455   EOSABS 0.4 03/20/2012 0455   BASOSABS 0.0 03/20/2012 0455   CMP    Component Value Date/Time   NA 138 03/20/2012 0455   K 3.9 03/20/2012 0455   CL 103 03/20/2012 0455   CO2 25 03/20/2012 0455   GLUCOSE 101* 03/20/2012 0455   BUN 54* 03/20/2012 0455   CREATININE 2.19* 03/20/2012 0455   CALCIUM 9.1 03/20/2012 0455   PROT 5.6* 03/20/2012 0455   ALBUMIN 2.7* 03/20/2012 0455   AST 19 03/20/2012 0455   ALT 10 03/20/2012 0455   ALKPHOS 72 03/20/2012 0455   BILITOT 0.2* 03/20/2012  0455   GFRNONAA 19* 03/20/2012 0455   GFRAA 22* 03/20/2012 0455   COAGS Lab Results  Component Value Date   INR 1.02 03/22/2012   INR 1.07 03/21/2012   INR 1.08 03/20/2012   Lipid Panel    Component Value Date/Time   CHOL 157 03/18/2012 0500   TRIG 146 03/18/2012 0500   HDL 62 03/18/2012 0500   CHOLHDL 2.5 03/18/2012 0500   VLDL 29 03/18/2012 0500   LDLCALC 66 03/18/2012 0500   HgbA1C  Lab Results  Component Value Date   HGBA1C 6.0* 03/20/2012   Cardiac Panel (last 3 results) No results found for this basename:  CKTOTAL:3,CKMB:3,TROPONINI:3,RELINDX:3 in the last 72 hours Urinalysis    Component Value Date/Time   COLORURINE YELLOW 12/12/2011 1323   APPEARANCEUR CLOUDY* 12/12/2011 1323   LABSPEC 1.012 12/12/2011 1323   PHURINE 6.0 12/12/2011 1323   GLUCOSEU NEGATIVE 12/12/2011 1323   HGBUR TRACE* 12/12/2011 1323   BILIRUBINUR NEGATIVE 12/12/2011 1323   KETONESUR NEGATIVE 12/12/2011 1323   PROTEINUR 30* 12/12/2011 1323   UROBILINOGEN 0.2 12/12/2011 1323   NITRITE NEGATIVE 12/12/2011 1323   LEUKOCYTESUR LARGE* 12/12/2011 1323   SIGNIFICANT DIAGNOSTIC STUDIES  CT of the brain 03/17/2012 1. No acute intracranial abnormality. 2. Diffuse cerebral atrophy and extensive chronic small vessel disease. Old right basal ganglia lacunar infarct.   CT Brain 03/17/2012 No intracranial hemorrhage or CT evidence of large acute infarct. Prominent small vessel disease type changes and remote right caudate head infarct   MRI of the brain pacer   MRA of the brain pacer   2D Echocardiogram EF 50-55%, Mod-severe AS (valve area 0.89cm^2), LA dilated, right ventricular pacing   Carotid Doppler There is 40-59% right ICA stenosis, mid range of scale. There is no left ICA stenosis. Bilateral vertebral artery flow is antegrade.   Transcranial Dopplers   CXR No acute abnormality   EKG sinus tachycardia   History of Present Illness    [name] is an 78 y.o. female who was getting dressed 03/17/2012. She acutely became dizzy and when she went to grab the bedrail she noted that her right arm was weak. Patient was brought in by private vehicle. Code stroke was called. NIHSS=2. Initial CT was unremarkable. She received TPA. She was admitted to the neuro ICU for further evaluation and treatment.  Hospital Course   She was admitted to neuro ICU. She was unable to get followup with MRI due to pacemaker, but followup CT did not show large infarct or hemorrhage. Patient with resultant right arm hemiparesis. Recommend anticoagulation for  secondary stroke prevention. No MRI secondary to pacer. No CTA secondary to creatinine. Currently on baby aspirin 81mg  daily + coumadin load. Stop aspirin once INR > 2.0.  TCD has been ordered but are remaining pending.   Patient has Stroke risk factors of atrial fibrillation, diabetes mellitus, hyperlipidemia and hypertension  Physical therapy, occupational therapy and speech therapy evaluated patient. All agreed inpatient rehab is needed. Patient's daughter (with whom she lives with is supportive and can provide care at discharge. CIR bed is available today and patient will be transferred there.  Discharge Exam  Blood pressure 147/50, pulse 60, temperature 98.2 F (36.8 C), temperature source Oral, resp. rate 20, height 4\' 11"  (1.499 m), weight 64.6 kg (142 lb 6.7 oz), SpO2 99.00%.   Physical Exam Pleasant obese elderly Caucasian lady currently not in distress.Awake alert. Afebrile. Head is nontraumatic. Neck is supple without bruit. Hearing is normal. Cardiac exam no murmur or  gallop. Lungs are clear to auscultation. Distal pulses are well felt.  Neurological Exam : awake alert oriented to place and person. Follows simple one and q.2 step commands. Diminished attention, short-term memory, registration and recall. No aphasia apraxia or dysarthria. Speech is likely hesitant and nonfluent. Extraocular moments are full range without nystagmus. She blinks to threat bilaterally. Fundi were not visualized. Vision acuity and fields appear normal. And mild right lower facial symmetry. Tongue is midline. Cough and gag appear adequate. Motor system exam reveals no upper or lower expected drift but fine finger movements are diminished on the right. Right grip is weaker than the left. She orbits left over right upper extremity. There is minimum right finger-to-nose dysmetria. Lower extremity strength is symmetric. Deep tendon reflexes are 1+ symmetric. Plantars are downgoing. Touch and pinprick sensation appear  preserved bilaterally. Gait was not tested.   Discharge Diet    thin liquids  Discharge Plan  Disposition:  Transfer to [facility] for ongoing PT, OT and ST  aspirin 81 mg orally every day and coumadin load for secondary stroke prevention. Once INR >2.0, stop baby aspirin.  Ongoing risk factor control by Primary Care Physician. Risk factor recommendations:  Hypertension target range 130-140/70-80 Lipid range - LDL < 100 and checked every 6 months, fasting Diabetes - HgB A1C <7   Follow-up Ginette Otto, MD in 1 month.  Follow-up with Dr. Delia Heady in 2 months.  Signed  Guy Franco, PAC,  MBA, MHA [facility] Pager: [phone] 03/22/2012 5:26 PM  Scribe for Dr. Delia Heady, Stroke Center Medical Director. He has personally reviewed chart, pertinent data, examined the patient and developed the plan of care. Pager:  [phone]

## 2012-03-22 NOTE — Progress Notes (Signed)
Occupational Therapy Session Note  Patient Details  Name: [name] MRN: [id number] Date of Birth: 03-13-1925  Today's Date: 03/22/2012 Time: 4696-2952 Time Calculation (min): 40 min  Short Term Goals: Week 1:  OT Short Term Goal 1 (Week 1): Short term goals equal LTGs secondary to estimated length of stay.  Skilled Therapeutic Interventions/Progress Updates:  Patient sleeping in bed upon arrival, "I'm so tired".  Patient willing to participate in therapy.  Bed>bathroom via rollator, propelled w/c with feet only, UEs only, and/or use of both hands and feet. Standing balance and tolerance, RUE strengthening, gross, and fine motor coordination tasks performed in sit and in stand.  Patient required ~8 rest breaks when propelling w/c and ~2 rest breaks during other activities.  Patient requested back to bed after session.    Therapy Documentation Precautions:  Precautions Precautions: Fall Precaution Comments: has broken L ankle x 2 Restrictions Weight Bearing Restrictions: No Pain: Denies pain  See FIM for current functional status  Therapy/Group: Individual Therapy  Walfred Bettendorf 03/22/2012, 4:34 PM

## 2012-03-23 ENCOUNTER — Inpatient Hospital Stay (HOSPITAL_COMMUNITY): Payer: Medicare Other | Admitting: Physical Therapy

## 2012-03-23 ENCOUNTER — Inpatient Hospital Stay (HOSPITAL_COMMUNITY): Payer: Medicare Other | Admitting: *Deleted

## 2012-03-23 LAB — GLUCOSE, CAPILLARY
Glucose-Capillary: 108 mg/dL — ABNORMAL HIGH (ref 70–99)
Glucose-Capillary: 251 mg/dL — ABNORMAL HIGH (ref 70–99)

## 2012-03-23 MED ORDER — WARFARIN SODIUM 5 MG PO TABS
5.0000 mg | ORAL_TABLET | Freq: Once | ORAL | Status: AC
  Administered 2012-03-23: 5 mg via ORAL
  Filled 2012-03-23: qty 1

## 2012-03-23 NOTE — Progress Notes (Signed)
Patient ID: [name], female   DOB: Apr 21, 1925, 78 y.o.   MRN: [id number]  No new complaints  [name] is a 78 y.o. right-handed female with history of diabetes mellitus with peripheral neuropathy, chronic kidney disease with baseline creatinine 2.3, PAF with pacemaker. Patient was independent prior to admission using a cane and sometimes a walker secondary to peripheral neuropathy and lives with her daughter who is a Publishing rights manager with a local cardiology group. Admitted 03/17/2012 with right-sided weakness and dizziness. Cranial CT scan was unremarkable for acute changes. She did receive TPA. MRI not completed secondary to pacemaker. Echocardiogram with normal systolic function and ejection fraction of 55%. Carotid Dopplers demonstrated 40-59% right ICA stenosis. EKG sinus tachycardia. Neurology services consulted for suspect left brain stroke and placed on Coumadin therapy with bridging of Lovenox. Subcutaneous Lovenox added for DVT prophylaxis.  CBG (last 3)   Basename 03/23/12 0750 03/22/12 2118 03/22/12 1624  GLUCAP 108* 173* 116*     Subjective/Complaints: Slept poorly but no specific reason.  Takes 2.5 mg ambien at home and sometimes repeats x 1  Objective: Vital Signs: Blood pressure 157/82, pulse 61, temperature 98 F (36.7 C), temperature source Oral, resp. rate 18, height 4\' 11"  (1.499 m), weight 140 lb 10.5 oz (63.8 kg), SpO2 97.00%. No results found. Results for orders placed during the hospital encounter of 03/19/12 (from the past 72 hour(s))  GLUCOSE, CAPILLARY     Status: Abnormal   Collection Time   03/20/12 11:42 AM      Component Value Range Comment   Glucose-Capillary 160 (*) 70 - 99 mg/dL    Comment 1 Notify RN     GLUCOSE, CAPILLARY     Status: Abnormal   Collection Time   03/20/12  4:26 PM      Component Value Range Comment   Glucose-Capillary 158 (*) 70 - 99 mg/dL   GLUCOSE, CAPILLARY     Status: Normal   Collection Time   03/20/12  8:46 PM    Component Value Range Comment   Glucose-Capillary 87  70 - 99 mg/dL   PROTIME-INR     Status: Normal   Collection Time   03/21/12  7:05 AM      Component Value Range Comment   Prothrombin Time 13.8  11.6 - 15.2 seconds    INR 1.07  0.00 - 1.49   GLUCOSE, CAPILLARY     Status: Normal   Collection Time   03/21/12  7:13 AM      Component Value Range Comment   Glucose-Capillary 99  70 - 99 mg/dL    Comment 1 Notify RN     GLUCOSE, CAPILLARY     Status: Abnormal   Collection Time   03/21/12 11:24 AM      Component Value Range Comment   Glucose-Capillary 211 (*) 70 - 99 mg/dL    Comment 1 Notify RN     GLUCOSE, CAPILLARY     Status: Normal   Collection Time   03/21/12  4:42 PM      Component Value Range Comment   Glucose-Capillary 96  70 - 99 mg/dL   GLUCOSE, CAPILLARY     Status: Abnormal   Collection Time   03/21/12  8:59 PM      Component Value Range Comment   Glucose-Capillary 158 (*) 70 - 99 mg/dL    Comment 1 Notify RN     PROTIME-INR     Status: Normal   Collection Time   03/22/12  7:10 AM      Component Value Range Comment   Prothrombin Time 13.3  11.6 - 15.2 seconds    INR 1.02  0.00 - 1.49   GLUCOSE, CAPILLARY     Status: Abnormal   Collection Time   03/22/12  7:18 AM      Component Value Range Comment   Glucose-Capillary 104 (*) 70 - 99 mg/dL    Comment 1 Notify RN     GLUCOSE, CAPILLARY     Status: Abnormal   Collection Time   03/22/12 11:52 AM      Component Value Range Comment   Glucose-Capillary 181 (*) 70 - 99 mg/dL   GLUCOSE, CAPILLARY     Status: Abnormal   Collection Time   03/22/12  4:24 PM      Component Value Range Comment   Glucose-Capillary 116 (*) 70 - 99 mg/dL   GLUCOSE, CAPILLARY     Status: Abnormal   Collection Time   03/22/12  9:18 PM      Component Value Range Comment   Glucose-Capillary 173 (*) 70 - 99 mg/dL   PROTIME-INR     Status: Normal   Collection Time   03/23/12  6:24 AM      Component Value Range Comment   Prothrombin  Time 15.2  11.6 - 15.2 seconds    INR 1.22  0.00 - 1.49   GLUCOSE, CAPILLARY     Status: Abnormal   Collection Time   03/23/12  7:50 AM      Component Value Range Comment   Glucose-Capillary 108 (*) 70 - 99 mg/dL      HEENT: normal Cardio: RRR Resp: CTA B/L GI: BS positive Extremity:  Pulses positive Skin:   Intact Neuro: Alert/Oriented, Cranial Nerve II-XII normal, Abnormal Sensory reduced sensation in bilateral feet, Abnormal Motor 4-/5 strength in the R delt,bi,tri,grip and Abnormal FMC Ataxic/ dec FMC Musc/Skel:  Other Hallux valgus GEN: NAD   Assessment/Plan: 1. Functional deficits secondary to L pontine embolic infarct not visualized on CT which require 3+ hours per day of interdisciplinary therapy in a comprehensive inpatient rehab setting. Physiatrist is providing close team supervision and 24 hour management of active medical problems listed below. Physiatrist and rehab team continue to assess barriers to discharge/monitor patient progress toward functional and medical goals. FIM: FIM - Bathing Bathing Steps Patient Completed: Chest;Right Arm;Left Arm;Abdomen;Front perineal area;Buttocks;Right upper leg;Right lower leg (including foot);Left upper leg;Left lower leg (including foot) Bathing: 5: Supervision: Safety issues/verbal cues  FIM - Upper Body Dressing/Undressing Upper body dressing/undressing steps patient completed: Thread/unthread right bra strap;Thread/unthread left bra strap;Hook/unhook bra;Thread/unthread right sleeve of pullover shirt/dresss;Thread/unthread left sleeve of pullover shirt/dress;Put head through opening of pull over shirt/dress;Pull shirt over trunk Upper body dressing/undressing: 5: Supervision: Safety issues/verbal cues FIM - Lower Body Dressing/Undressing Lower body dressing/undressing steps patient completed: Thread/unthread right underwear leg;Thread/unthread left underwear leg;Pull underwear up/down;Thread/unthread right pants  leg;Thread/unthread left pants leg Lower body dressing/undressing: 5: Supervision: Safety issues/verbal cues  FIM - Toileting Toileting steps completed by patient: Adjust clothing prior to toileting;Performs perineal hygiene;Adjust clothing after toileting Toileting: 5: Supervision: Safety issues/verbal cues  FIM - Diplomatic Services operational officer Devices: Grab bars;Walker Toilet Transfers: 5-To toilet/BSC: Supervision (verbal cues/safety issues);5-From toilet/BSC: Supervision (verbal cues/safety issues)  FIM - Banker Devices: Teacher, music: 5: Chair or W/C > Bed: Supervision (verbal cues/safety issues);5: Sit > Supine: Supervision (verbal cues/safety issues)  FIM - Locomotion: Wheelchair Distance: 40 Locomotion: Wheelchair: 5: [employer]  50 - 149 ft, turns around, maneuvers to table, bed or toilet, negotiates 3% grade: modified independent FIM - Locomotion: Ambulation Locomotion: Ambulation Assistive Devices: Walker - Rolling [phone]) Ambulation/Gait Assistance: 5: Supervision Locomotion: Ambulation: 2: Travels 50 - 149 ft with supervision/safety issues  Comprehension Comprehension Mode: Auditory Comprehension: 7-Follows complex conversation/direction: With no assist  Expression Expression Mode: Verbal Expression: 7-Expresses complex ideas: With no assist  Social Interaction Social Interaction: 6-Interacts appropriately with others with medication or extra time (anti-anxiety, antidepressant).  Problem Solving Problem Solving: 6-Solves complex problems: With extra time  Memory Memory: 7-Complete Independence: No helper Medical Problem List and Plan:  1. Subcortical left brain infarcts  2. DVT Prophylaxis/Anticoagulation: Chronic Coumadin therapy with Lovenox for bridging until INR greater than 2.00  3. Pain Management: Ultram 50 mg every 12 hours as needed, Lidoderm patch. Monitor with increased mobility  4.  Neuropsych: This patient is capable of making decisions on his/her own behalf.  5. PAF/pacemaker. Amiodarone 200 mg daily. Cardiac rate control  6. Chronic kidney disease. Baseline creatinine 2.3. Continue Lasix 40 mg daily as well as Calcitrol 0.25 mcg every other day. Followup chemistries  7. Diabetes mellitus with peripheral neuropathy. Hemoglobin A1c 6.1. Continue Lantus insulin 16 units daily and NovoLog 8 units 3 times a day. Check blood sugars a.c. and at bedtime  8. CHF. Continue Lasix 40 mg daily and monitor for any signs of fluid overload  9. Chronic urinary tract infection. Keflex 250 mg daily prophylactically  10. Hyperlipidemia. Zetia/Zocor  11. Anemia of chronic disease. Latest hemoglobin 8.8. Monitor for any bleeding episodes and followup CBC 12.  Insomia, resume home dose ambien slept well  LOS (Days) 4 A FACE TO FACE EVALUATION WAS PERFORMED  Alex Zalea Pete 03/23/2012, 9:07 AM

## 2012-03-23 NOTE — Progress Notes (Signed)
ANTICOAGULATION CONSULT NOTE - Follow Up Consult  Pharmacy Consult for Coumadin, s/p TPA infusion. Indication: CVA and PAF  Vital Signs: Temp: 98 F (36.7 C) (10/19 0500) Temp src: Oral (10/19 0500) BP: 157/82 mmHg (10/19 0500) Pulse Rate: 61  (10/19 0500)  Labs:  Basename 03/23/12 0624 03/22/12 0710 03/21/12 0705  HGB -- -- --  HCT -- -- --  PLT -- -- --  APTT -- -- --  LABPROT 15.2 13.3 13.8  INR 1.22 1.02 1.07  HEPARINUNFRC -- -- --  CREATININE -- -- --  CKTOTAL -- -- --  CKMB -- -- --  TROPONINI -- -- --    Estimated Creatinine Clearance: 15.2 ml/min (by C-G formula based on Cr of 2.19).  Assessment:78 y.o.female admitted for Cerebral embolism with cerebral infarction and atrial fibrillation, s/p TPA infusion. CT negative for hemorrhage. INR remains subtherapeutic at 1.22 today after 5 doses of coumadin. No bleeding noted. No CBC today.   Goal of Therapy:  INR around 2.5 Monitor platelets by anticoagulation protocol: Yes   Plan:  1. Repeat coumadin 5 mg PO x 1 tonight 2. F/u AM INR 3. Aspirin 81 mg to be stopped once INR > 2  Lysle Pearl, PharmD, BCPS Pager # [phone] 03/23/2012 10:59 AM

## 2012-03-23 NOTE — Progress Notes (Signed)
Occupational Therapy Note  Patient Details  Name: [name] MRN: [id number] Date of Birth: 1924/09/24 Today's Date: 03/23/2012  Individual Session Time:  0945-1030 (45 min)  1st session Pain:  None Pt engaged in therapeutic activity preparing grilled cheese sandwich.  Utilized Rollator walker for moving around in kitchen.  Pt. Prepared sandwich at supervision to moderate independent level.  She needed 4 sit down rest breaks in 20 min activity.  She was 3/4 on dysnea scale.  HR= 62, O2=97%.  Pt. Propelled wc from her room to apartment with 4 rest breaks.  She reports her heart has atrail fibulation and she has to take rest breaks.  She can use her rollator walker as needed for sit down rest breaks when at home.      Individual Session Time:  1430-1515 (45 min)  2nd session Pain:  None Engaged in light household duties for endurance, balance, and strength.  Pt. Hung clothes on clothesline with moderate assist.  She had no loss of balance but tended to hang on her hips.  Instructed pt to utilize abdominals to help with postural control. Son and daughter in law present.  Propelled self in wc from room to gym and back independently  3 rest breaks each way.    Leane Call 03/23/2012, 10:32 AM

## 2012-03-23 NOTE — Progress Notes (Signed)
Physical Therapy Session Note  Patient Details  Name: [name] MRN: [id number] Date of Birth: 1925/05/28  Today's Date: 03/23/2012 Time: 4098-1191 Time Calculation (min): 40 min  Short Term Goals: Week 1:  PT Short Term Goal 1 (Week 1): pt will perform all bed mobility modified independently PT Short Term Goal 2 (Week 1): pt will perform basic transfers with supervision PT Short Term Goal 3 (Week 1): pt will propel w/c x 150' with supervision for activity tolerance PT Short Term Goal 4 (Week 1): pt will perform gait x 150' with supervision with LRAD PT Short Term Goal 5 (Week 1): pt will ascend/descend 5 steps with superviison  Skilled Therapeutic Interventions/Progress Updates:    Ambulation x 150', 120', 130' with rollator walker, SpO2 =94% at lowest on room air. Car transfer x 2 reps performed. Discussed need to back to seat as opposed to stepping in to avoid loss of balance (as common with toe tap exercises). Toe taps onto 6" step to assess single limb stance and carryover from morning session, pt able to recall contraction of core and buttocks, pt only needed 1 min assist for loss of balance otherwise improving ability to self correct.   Therapy Documentation Precautions:  Precautions Precautions: Fall Precaution Comments: has broken L ankle x 2 Restrictions Weight Bearing Restrictions: No Pain: Pain Assessment Pain Assessment: No/denies pain  See FIM for current functional status  Therapy/Group: Individual Therapy  Wilhemina Bonito 03/23/2012, 5:29 PM

## 2012-03-23 NOTE — Progress Notes (Signed)
Physical Therapy Session Note  Patient Details  Name: [name] MRN: [id number] Date of Birth: 08-10-1924  Today's Date: 03/23/2012 Time: 4098-1191 Time Calculation (min): 44 min   Skilled Therapeutic Interventions/Progress Updates:    Very pleasant pt. Ambulation x 150' with rollator walker and supervision, pt declining rest and was able to perform continuously however SpO2 dropped to 87% on room air, quickly returned to 98% on room air with seated rest. Practiced 10 consecutive steps with bil. Railing at supervision level cues for lower extremity sequencing, SpO2 97%. Standing balance activities working single limb stance min/mod assist needed for balance on Rt. LE progressing to min-guard with cues for contraction of core and glutes prior to stepping.   Therapy Documentation Precautions:  Precautions Precautions: Fall Precaution Comments: has broken L ankle x 2 Restrictions Weight Bearing Restrictions: No   Pain: Pain Assessment Pain Assessment: No/denies pain Pain Score: 0-No pain  See FIM for current functional status  Therapy/Group: Individual Therapy  Wilhemina Bonito 03/23/2012, 12:44 PM

## 2012-03-24 ENCOUNTER — Inpatient Hospital Stay (HOSPITAL_COMMUNITY): Payer: Medicare Other | Admitting: Physical Therapy

## 2012-03-24 ENCOUNTER — Inpatient Hospital Stay (HOSPITAL_COMMUNITY): Payer: Medicare Other | Admitting: *Deleted

## 2012-03-24 LAB — PROTIME-INR
INR: 1.44 (ref 0.00–1.49)
Prothrombin Time: 17.2 seconds — ABNORMAL HIGH (ref 11.6–15.2)

## 2012-03-24 LAB — GLUCOSE, CAPILLARY: Glucose-Capillary: 115 mg/dL — ABNORMAL HIGH (ref 70–99)

## 2012-03-24 MED ORDER — DEXTROMETHORPHAN POLISTIREX 30 MG/5ML PO LQCR
60.0000 mg | Freq: Two times a day (BID) | ORAL | Status: DC | PRN
  Filled 2012-03-24: qty 10

## 2012-03-24 MED ORDER — WARFARIN SODIUM 5 MG PO TABS
5.0000 mg | ORAL_TABLET | Freq: Once | ORAL | Status: AC
  Administered 2012-03-24: 5 mg via ORAL
  Filled 2012-03-24: qty 1

## 2012-03-24 NOTE — Progress Notes (Signed)
Patient ID: [name], female   DOB: [birth year], 78 y.o.   MRN: [id number]  No new complaints, except for a dry cough - days or weeks  [name] is a 78 y.o. right-handed female with history of diabetes mellitus with peripheral neuropathy, chronic kidney disease with baseline creatinine 2.3, PAF with pacemaker. Patient was independent prior to admission using a cane and sometimes a walker secondary to peripheral neuropathy and lives with her daughter who is a Publishing rights manager with a local cardiology group. Admitted 03/17/2012 with right-sided weakness and dizziness. Cranial CT scan was unremarkable for acute changes. She did receive TPA. MRI not completed secondary to pacemaker. Echocardiogram with normal systolic function and ejection fraction of 55%. Carotid Dopplers demonstrated 40-59% right ICA stenosis. EKG sinus tachycardia. Neurology services consulted for suspect left brain stroke and placed on Coumadin therapy with bridging of Lovenox. Subcutaneous Lovenox added for DVT prophylaxis.  CBG (last 3)   Basename 03/24/12 0716 03/23/12 2039 03/23/12 1637  GLUCAP 101* 86 98     Subjective/Complaints: Slept poorly but no specific reason.  Takes 2.5 mg ambien at home and sometimes repeats x 1  Objective: Vital Signs: Blood pressure 159/75, pulse 61, temperature 97.8 F (36.6 C), temperature source Oral, resp. rate 18, height 4\' 11"  (1.499 m), weight 140 lb 10.5 oz (63.8 kg), SpO2 97.00%. No results found. Results for orders placed during the hospital encounter of 03/19/12 (from the past 72 hour(s))  GLUCOSE, CAPILLARY     Status: Abnormal   Collection Time   03/21/12 11:24 AM      Component Value Range Comment   Glucose-Capillary 211 (*) 70 - 99 mg/dL    Comment 1 Notify RN     GLUCOSE, CAPILLARY     Status: Normal   Collection Time   03/21/12  4:42 PM      Component Value Range Comment   Glucose-Capillary 96  70 - 99 mg/dL   GLUCOSE, CAPILLARY     Status: Abnormal   Collection Time   03/21/12  8:59 PM      Component Value Range Comment   Glucose-Capillary 158 (*) 70 - 99 mg/dL    Comment 1 Notify RN     PROTIME-INR     Status: Normal   Collection Time   03/22/12  7:10 AM      Component Value Range Comment   Prothrombin Time 13.3  11.6 - 15.2 seconds    INR 1.02  0.00 - 1.49   GLUCOSE, CAPILLARY     Status: Abnormal   Collection Time   03/22/12  7:18 AM      Component Value Range Comment   Glucose-Capillary 104 (*) 70 - 99 mg/dL    Comment 1 Notify RN     GLUCOSE, CAPILLARY     Status: Abnormal   Collection Time   03/22/12 11:52 AM      Component Value Range Comment   Glucose-Capillary 181 (*) 70 - 99 mg/dL   GLUCOSE, CAPILLARY     Status: Abnormal   Collection Time   03/22/12  4:24 PM      Component Value Range Comment   Glucose-Capillary 116 (*) 70 - 99 mg/dL   GLUCOSE, CAPILLARY     Status: Abnormal   Collection Time   03/22/12  9:18 PM      Component Value Range Comment   Glucose-Capillary 173 (*) 70 - 99 mg/dL   PROTIME-INR     Status: Normal   Collection Time  03/23/12  6:24 AM      Component Value Range Comment   Prothrombin Time 15.2  11.6 - 15.2 seconds    INR 1.22  0.00 - 1.49   GLUCOSE, CAPILLARY     Status: Abnormal   Collection Time   03/23/12  7:50 AM      Component Value Range Comment   Glucose-Capillary 108 (*) 70 - 99 mg/dL   GLUCOSE, CAPILLARY     Status: Abnormal   Collection Time   03/23/12 11:24 AM      Component Value Range Comment   Glucose-Capillary 251 (*) 70 - 99 mg/dL   GLUCOSE, CAPILLARY     Status: Normal   Collection Time   03/23/12  4:37 PM      Component Value Range Comment   Glucose-Capillary 98  70 - 99 mg/dL   GLUCOSE, CAPILLARY     Status: Normal   Collection Time   03/23/12  8:39 PM      Component Value Range Comment   Glucose-Capillary 86  70 - 99 mg/dL    Comment 1 Notify RN     PROTIME-INR     Status: Abnormal   Collection Time   03/24/12  6:15 AM      Component Value Range  Comment   Prothrombin Time 17.2 (*) 11.6 - 15.2 seconds    INR 1.44  0.00 - 1.49   GLUCOSE, CAPILLARY     Status: Abnormal   Collection Time   03/24/12  7:16 AM      Component Value Range Comment   Glucose-Capillary 101 (*) 70 - 99 mg/dL      HEENT: normal Cardio: RRR Resp: CTA B/L GI: BS positive Extremity:  Pulses positive Skin:   Intact Neuro: Alert/Oriented, Cranial Nerve II-XII normal, Abnormal Sensory reduced sensation in bilateral feet, Abnormal Motor 4-/5 strength in the R delt,bi,tri,grip and Abnormal FMC Ataxic/ dec FMC Musc/Skel:  Other Hallux valgus GEN: NAD   Assessment/Plan: 1. Functional deficits secondary to L pontine embolic infarct not visualized on CT which require 3+ hours per day of interdisciplinary therapy in a comprehensive inpatient rehab setting. Physiatrist is providing close team supervision and 24 hour management of active medical problems listed below. Physiatrist and rehab team continue to assess barriers to discharge/monitor patient progress toward functional and medical goals. FIM: FIM - Bathing Bathing Steps Patient Completed: Chest;Right Arm;Left Arm;Abdomen;Front perineal area;Buttocks;Right upper leg;Right lower leg (including foot);Left upper leg;Left lower leg (including foot) Bathing: 5: Supervision: Safety issues/verbal cues  FIM - Upper Body Dressing/Undressing Upper body dressing/undressing steps patient completed: Thread/unthread right bra strap;Thread/unthread left bra strap;Hook/unhook bra;Thread/unthread right sleeve of pullover shirt/dresss;Thread/unthread left sleeve of pullover shirt/dress;Put head through opening of pull over shirt/dress;Pull shirt over trunk Upper body dressing/undressing: 5: Supervision: Safety issues/verbal cues FIM - Lower Body Dressing/Undressing Lower body dressing/undressing steps patient completed: Thread/unthread right underwear leg;Thread/unthread left underwear leg;Pull underwear up/down;Thread/unthread  right pants leg;Thread/unthread left pants leg Lower body dressing/undressing: 5: Supervision: Safety issues/verbal cues  FIM - Toileting Toileting steps completed by patient: Adjust clothing prior to toileting;Performs perineal hygiene;Adjust clothing after toileting Toileting Assistive Devices: Grab bar or rail for support Toileting: 4: Steadying assist  FIM - Diplomatic Services operational officer Devices: Bedside commode Toilet Transfers: 4-To toilet/BSC: Min A (steadying Pt. > 75%);4-From toilet/BSC: Min A (steadying Pt. > 75%)  FIM - Bed/Chair Transfer Bed/Chair Transfer Assistive Devices: Teacher, music: 5: Supine > Sit: Supervision (verbal cues/safety issues);5: Bed > Chair or W/C: Supervision (verbal  cues/safety issues);5: Chair or W/C > Bed: Supervision (verbal cues/safety issues)  FIM - Locomotion: Wheelchair Distance: 40 Locomotion: Wheelchair: 5: Travels 50 - 149 ft, turns around, maneuvers to table, bed or toilet, negotiates 3% grade: modified independent FIM - Locomotion: Ambulation Locomotion: Ambulation Assistive Devices:  (4 wheeled walker) Ambulation/Gait Assistance: 5: Supervision Locomotion: Ambulation: 5: Travels 150 ft or more with supervision/safety issues  Comprehension Comprehension Mode: Auditory Comprehension: 6-Follows complex conversation/direction: With extra time/assistive device  Expression Expression Mode: Verbal Expression: 6-Expresses complex ideas: With extra time/assistive device  Social Interaction Social Interaction: 6-Interacts appropriately with others with medication or extra time (anti-anxiety, antidepressant).  Problem Solving Problem Solving: 6-Solves complex problems: With extra time  Memory Memory: 6-More than reasonable amt of time Medical Problem List and Plan:  1. Subcortical left brain infarcts  2. DVT Prophylaxis/Anticoagulation: Chronic Coumadin therapy with Lovenox for bridging until INR greater than 2.00    3. Pain Management: Ultram 50 mg every 12 hours as needed, Lidoderm patch. Monitor with increased mobility  4. Neuropsych: This patient is capable of making decisions on his/her own behalf.  5. PAF/pacemaker. Amiodarone 200 mg daily. Cardiac rate control  6. Chronic kidney disease. Baseline creatinine 2.3. Continue Lasix 40 mg daily as well as Calcitrol 0.25 mcg every other day. Followup chemistries  7. Diabetes mellitus with peripheral neuropathy. Hemoglobin A1c 6.1. Continue Lantus insulin 16 units daily and NovoLog 8 units 3 times a day. Check blood sugars a.c. and at bedtime  8. CHF. Continue Lasix 40 mg daily and monitor for any signs of fluid overload  9. Chronic urinary tract infection. Keflex 250 mg daily prophylactically  10. Hyperlipidemia. Zetia/Zocor  11. Anemia of chronic disease. Latest hemoglobin 8.8. Monitor for any bleeding episodes and followup CBC 12.  Insomia, resume home dose ambien slept well 13. C/o dry cough - ?etiology. It could be related to amiodarone Rx. No signs of CHF.We may need to get a CXR if not better. Delsym prn.  LOS (Days) 5 A FACE TO FACE EVALUATION WAS PERFORMED  Alex Plotnikov 03/24/2012, 8:29 AM

## 2012-03-24 NOTE — Progress Notes (Signed)
ANTICOAGULATION CONSULT NOTE - Follow Up Consult  Pharmacy Consult for Coumadin, s/p TPA infusion. Indication: CVA and PAF  Vital Signs: Temp: 97.8 F (36.6 C) (10/20 0500) Temp src: Oral (10/20 0500) BP: 159/75 mmHg (10/20 0500) Pulse Rate: 61  (10/20 0500)  Labs:  Basename 03/24/12 0615 03/23/12 0624 03/22/12 0710  HGB -- -- --  HCT -- -- --  PLT -- -- --  APTT -- -- --  LABPROT 17.2* 15.2 13.3  INR 1.44 1.22 1.02  HEPARINUNFRC -- -- --  CREATININE -- -- --  CKTOTAL -- -- --  CKMB -- -- --  TROPONINI -- -- --    Estimated Creatinine Clearance: 15.2 ml/min (by C-G formula based on Cr of 2.19).  Assessment:78 y.o.female admitted for Cerebral embolism with cerebral infarction and atrial fibrillation, s/p TPA infusion. CT negative for hemorrhage. INR remains subtherapeutic at 1.44 today after 6 doses of coumadin but now starting to trend up nicely. No bleeding noted. No CBC today.   Goal of Therapy:  INR around 2.5 Monitor platelets by anticoagulation protocol: Yes   Plan:  1. Repeat coumadin 5 mg PO x 1 tonight 2. F/u AM INR 3. Aspirin 81 mg to be stopped once INR > 2  Lysle Pearl, PharmD, BCPS Pager # [phone] 03/24/2012 9:52 AM

## 2012-03-24 NOTE — Progress Notes (Signed)
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 Oxygen sats > 92% RA during treatment.   Javaun Dimperio,JIM 03/24/2012, 8:06 AM

## 2012-03-25 ENCOUNTER — Inpatient Hospital Stay (HOSPITAL_COMMUNITY): Payer: Medicare Other | Admitting: Physical Therapy

## 2012-03-25 ENCOUNTER — Inpatient Hospital Stay (HOSPITAL_COMMUNITY): Payer: Medicare Other | Admitting: Occupational Therapy

## 2012-03-25 LAB — PROTIME-INR
INR: 1.75 — ABNORMAL HIGH (ref 0.00–1.49)
Prothrombin Time: 19.8 seconds — ABNORMAL HIGH (ref 11.6–15.2)

## 2012-03-25 LAB — GLUCOSE, CAPILLARY
Glucose-Capillary: 97 mg/dL (ref 70–99)
Glucose-Capillary: 124 mg/dL — ABNORMAL HIGH (ref 70–99)

## 2012-03-25 MED ORDER — WARFARIN SODIUM 5 MG PO TABS
5.0000 mg | ORAL_TABLET | Freq: Once | ORAL | Status: DC
  Filled 2012-03-25: qty 1

## 2012-03-25 MED ORDER — WARFARIN SODIUM 5 MG PO TABS
5.0000 mg | ORAL_TABLET | Freq: Once | ORAL | Status: AC
  Administered 2012-03-25: 5 mg via ORAL
  Filled 2012-03-25: qty 1

## 2012-03-25 NOTE — Progress Notes (Signed)
Physical Therapy Note  Patient Details  Name: [name] MRN: [id number] Date of Birth: 17-Aug-1924 Today's Date: 03/25/2012  1100-1155 (55 minutes) individual Pain: no complaint of pain Focus of treatment : Standing balance including Berg Balance Testing; gait training/endurance with AD Treatment: Gait with 4 wheeled RW SBA room>< gym (120 feet) ; Berg Balance Test = 38/56 (80% fall risk); Standing stepping to 2 inch step 2 X 10 with min assist for balance to improve single leg stance time; Standing reaching activity turning to right , left and reaching forward (close SBA).   1530- 1610 (40 minutes) individual Pain: no complaint of pain Focus of treatment: gait training/endurance; therapeutic activities to facilitate protective balance reactions Treatment: Gait to/from room to gym 120 feet X 2 rollator SBA; standing on soft foam surface with min challenges in all directions to facilitate protective ankle strategies; gait around obstacles and up/down curb without AD min assist with no loss of balance; standing without UE support performing horseshoe toss SBA.   [name],JIM 03/25/2012, 7:17 AM

## 2012-03-25 NOTE — Progress Notes (Signed)
Patient ID: [name], female   DOB: 20-Nov-1924, 78 y.o.   MRN: [id number] [name] is a 78 y.o. right-handed female with history of diabetes mellitus with peripheral neuropathy, chronic kidney disease with baseline creatinine 2.3, PAF with pacemaker. Patient was independent prior to admission using a cane and sometimes a walker secondary to peripheral neuropathy and lives with her daughter who is a Publishing rights manager with a local cardiology group. Admitted 03/17/2012 with right-sided weakness and dizziness. Cranial CT scan was unremarkable for acute changes. She did receive TPA. MRI not completed secondary to pacemaker. Echocardiogram with normal systolic function and ejection fraction of 55%. Carotid Dopplers demonstrated 40-59% right ICA stenosis. EKG sinus tachycardia. Neurology services consulted for suspect left brain stroke and placed on Coumadin therapy with bridging of Lovenox. Subcutaneous Lovenox added for DVT prophylaxis.  Subjective/Complaints: occ cough night before last No SOB, No CP  Review of Systems  Constitutional: Negative for fever.  Respiratory: Positive for cough. Negative for sputum production, shortness of breath and wheezing.   Gastrointestinal: Negative for abdominal pain, diarrhea and constipation.  Genitourinary: Negative for dysuria.  All other systems reviewed and are negative.     Objective: Vital Signs: Blood pressure 117/56, pulse 60, temperature 97.9 F (36.6 C), temperature source Oral, resp. rate 19, height 4\' 11"  (1.499 m), weight 63.8 kg (140 lb 10.5 oz), SpO2 97.00%. No results found. Results for orders placed during the hospital encounter of 03/19/12 (from the past 72 hour(s))  GLUCOSE, CAPILLARY     Status: Abnormal   Collection Time   03/22/12 11:52 AM      Component Value Range Comment   Glucose-Capillary 181 (*) 70 - 99 mg/dL   GLUCOSE, CAPILLARY     Status: Abnormal   Collection Time   03/22/12  4:24 PM      Component Value Range  Comment   Glucose-Capillary 116 (*) 70 - 99 mg/dL   GLUCOSE, CAPILLARY     Status: Abnormal   Collection Time   03/22/12  9:18 PM      Component Value Range Comment   Glucose-Capillary 173 (*) 70 - 99 mg/dL   PROTIME-INR     Status: Normal   Collection Time   03/23/12  6:24 AM      Component Value Range Comment   Prothrombin Time 15.2  11.6 - 15.2 seconds    INR 1.22  0.00 - 1.49   GLUCOSE, CAPILLARY     Status: Abnormal   Collection Time   03/23/12  7:50 AM      Component Value Range Comment   Glucose-Capillary 108 (*) 70 - 99 mg/dL   GLUCOSE, CAPILLARY     Status: Abnormal   Collection Time   03/23/12 11:24 AM      Component Value Range Comment   Glucose-Capillary 251 (*) 70 - 99 mg/dL   GLUCOSE, CAPILLARY     Status: Normal   Collection Time   03/23/12  4:37 PM      Component Value Range Comment   Glucose-Capillary 98  70 - 99 mg/dL   GLUCOSE, CAPILLARY     Status: Normal   Collection Time   03/23/12  8:39 PM      Component Value Range Comment   Glucose-Capillary 86  70 - 99 mg/dL    Comment 1 Notify RN     PROTIME-INR     Status: Abnormal   Collection Time   03/24/12  6:15 AM      Component Value Range  Comment   Prothrombin Time 17.2 (*) 11.6 - 15.2 seconds    INR 1.44  0.00 - 1.49   GLUCOSE, CAPILLARY     Status: Abnormal   Collection Time   03/24/12  7:16 AM      Component Value Range Comment   Glucose-Capillary 101 (*) 70 - 99 mg/dL   GLUCOSE, CAPILLARY     Status: Abnormal   Collection Time   03/24/12 11:46 AM      Component Value Range Comment   Glucose-Capillary 300 (*) 70 - 99 mg/dL    Comment 1 Notify RN     GLUCOSE, CAPILLARY     Status: Abnormal   Collection Time   03/24/12  4:45 PM      Component Value Range Comment   Glucose-Capillary 134 (*) 70 - 99 mg/dL   GLUCOSE, CAPILLARY     Status: Abnormal   Collection Time   03/24/12  8:25 PM      Component Value Range Comment   Glucose-Capillary 115 (*) 70 - 99 mg/dL    Comment 1 Notify RN       GLUCOSE, CAPILLARY     Status: Normal   Collection Time   03/25/12  7:33 AM      Component Value Range Comment   Glucose-Capillary 97  70 - 99 mg/dL    Comment 1 Notify RN        HEENT: normal Cardio: RRR Resp: CTA B/L GI: BS positive Extremity:  Pulses positive, no pedal edema Skin:   Intact Neuro: Alert/Oriented, Cranial Nerve II-XII normal, Abnormal Sensory reduced sensation in bilateral feet, Abnormal Motor 4-/5 strength in the R delt,bi,tri,grip and Abnormal FMC Ataxic/ dec FMC Musc/Skel:  Other Hallux valgus GEN: NAD   Assessment/Plan: 1. Functional deficits secondary to L pontine embolic infarct not visualized on CT which require 3+ hours per day of interdisciplinary therapy in a comprehensive inpatient rehab setting. Physiatrist is providing close team supervision and 24 hour management of active medical problems listed below. Physiatrist and rehab team continue to assess barriers to discharge/monitor patient progress toward functional and medical goals. FIM: FIM - Bathing Bathing Steps Patient Completed: Chest;Right Arm;Left Arm;Abdomen;Front perineal area;Buttocks;Right upper leg;Right lower leg (including foot);Left upper leg;Left lower leg (including foot) Bathing: 5: Supervision: Safety issues/verbal cues  FIM - Upper Body Dressing/Undressing Upper body dressing/undressing steps patient completed: Thread/unthread right bra strap;Thread/unthread left bra strap;Hook/unhook bra;Thread/unthread right sleeve of pullover shirt/dresss;Thread/unthread left sleeve of pullover shirt/dress;Put head through opening of pull over shirt/dress;Pull shirt over trunk Upper body dressing/undressing: 5: Supervision: Safety issues/verbal cues FIM - Lower Body Dressing/Undressing Lower body dressing/undressing steps patient completed: Thread/unthread right underwear leg;Thread/unthread left underwear leg;Pull underwear up/down;Thread/unthread right pants leg;Thread/unthread left pants  leg Lower body dressing/undressing: 5: Supervision: Safety issues/verbal cues  FIM - Toileting Toileting steps completed by patient: Adjust clothing prior to toileting;Adjust clothing after toileting;Performs perineal hygiene Toileting Assistive Devices: Grab bar or rail for support Toileting: 4: Steadying assist  FIM - Diplomatic Services operational officer Devices: Bedside commode Toilet Transfers: 4-To toilet/BSC: Min A (steadying Pt. > 75%);4-From toilet/BSC: Min A (steadying Pt. > 75%)  FIM - Banker Devices: Teacher, music: 5: Supine > Sit: Supervision (verbal cues/safety issues);5: Bed > Chair or W/C: Supervision (verbal cues/safety issues);5: Chair or W/C > Bed: Supervision (verbal cues/safety issues)  FIM - Locomotion: Wheelchair Distance: 40 Locomotion: Wheelchair: 5: Travels 50 - 149 ft, turns around, maneuvers to table, bed or toilet, negotiates 3% grade:  modified independent FIM - Locomotion: Ambulation Locomotion: Ambulation Assistive Devices:  (4 wheeled walker) Ambulation/Gait Assistance: 5: Supervision Locomotion: Ambulation: 5: Travels 150 ft or more with supervision/safety issues  Comprehension Comprehension Mode: Auditory Comprehension: 6-Follows complex conversation/direction: With extra time/assistive device  Expression Expression Mode: Verbal Expression: 6-Expresses complex ideas: With extra time/assistive device  Social Interaction Social Interaction: 6-Interacts appropriately with others with medication or extra time (anti-anxiety, antidepressant).  Problem Solving Problem Solving: 6-Solves complex problems: With extra time  Memory Memory: 6-More than reasonable amt of time Medical Problem List and Plan:  1. Subcortical left brain infarcts  2. DVT Prophylaxis/Anticoagulation: Chronic Coumadin therapy with Lovenox for bridging until INR greater than 2.00  3. Pain Management: Ultram 50 mg every 12  hours as needed, Lidoderm patch. Monitor with increased mobility  4. Neuropsych: This patient is capable of making decisions on his/her own behalf.  5. PAF/pacemaker. Amiodarone 200 mg daily. Cardiac rate control  6. Chronic kidney disease. Baseline creatinine 2.3. Continue Lasix 40 mg daily as well as Calcitrol 0.25 mcg every other day. Followup chemistries  7. Diabetes mellitus with peripheral neuropathy. Hemoglobin A1c 6.1. Continue Lantus insulin 16 units daily and NovoLog 8 units 3 times a day. Check blood sugars a.c. and at bedtime  8. CHF. Continue Lasix 40 mg daily and monitor for any signs of fluid overload  9. Chronic urinary tract infection. Keflex 250 mg daily prophylactically  10. Hyperlipidemia. Zetia/Zocor  11. Anemia of chronic disease. Latest hemoglobin 8.0 Monitor for any bleeding episodes and followup CBC per pharmacy protocol 12.  Insomia, resume home dose ambien slept well  LOS (Days) 6 A FACE TO FACE EVALUATION WAS PERFORMED  Josuha Fontanez E 03/25/2012, 7:43 AM

## 2012-03-25 NOTE — Progress Notes (Signed)
Occupational Therapy Session Note  Patient Details  Name: [name] MRN: [id number] Date of Birth: 08/19/1924  Today's Date: 03/25/2012 Time: 8469-6295 Time Calculation (min): 26 min  Short Term Goals: Week 1:  OT Short Term Goal 1 (Week 1): Short term goals equal LTGs secondary to estimated length of stay.  Skilled Therapeutic Interventions/Progress Updates:  Patient resting in chair at bedside upon arrival.  Ambulated with Rollator to therapy apartment and worked balance activities on evena dn uneven surfaces, with and without her walker as well as with and without BUE support.  Patient with minimal LOB 2-3 times however was able to self correct with ankle and hip strategies.  Reviewed tub bench transfers and patient ambulated back to her hospital room.  Therapy Documentation Precautions:  Precautions Precautions: Fall Precaution Comments: has broken L ankle x 2 Restrictions Weight Bearing Restrictions: No Pain: Denies pain See FIM for current functional status  Therapy/Group: Individual Therapy  Tami Barren 03/25/2012, 4:20 PM

## 2012-03-25 NOTE — Progress Notes (Signed)
ANTICOAGULATION CONSULT NOTE - Follow Up Consult  Pharmacy Consult for Coumadin Indication: PAF and secondary stroke prevention  Vital Signs: Temp: 97.9 F (36.6 C) (10/21 0500) Temp src: Oral (10/21 0500) BP: 117/56 mmHg (10/21 0500) Pulse Rate: 60  (10/21 0500)  Labs:  Alvira Philips 03/25/12 0718 03/24/12 0615 03/23/12 0624  HGB -- -- --  HCT -- -- --  PLT -- -- --  APTT -- -- --  LABPROT 19.8* 17.2* 15.2  INR 1.75* 1.44 1.22  HEPARINUNFRC -- -- --  CREATININE -- -- --  CKTOTAL -- -- --  CKMB -- -- --  TROPONINI -- -- --    Estimated Creatinine Clearance: 15.2 ml/min (by C-G formula based on Cr of 2.19).  Assessment:78 y.o.female admitted for Cerebral embolism with cerebral infarction and atrial fibrillation, s/p TPA infusion. CT negative for hemorrhage. INR remains subtherapeutic but continues to trend up. Noted drug interaction with amiodarone. No bleeding noted. No CBC today. MD's note indicate patient on lovenox, patient is NOT receiving lovenox.  Goal of Therapy:  INR=2-3 Monitor platelets by anticoagulation protocol: Yes   Plan:  1. Repeat coumadin 5 mg PO x 1 tonight 2. F/u AM INR, cbc 3. Aspirin 81 mg to be stopped once INR > 2  Verlene Mayer, PharmD, [place] Pager [phone] 03/25/2012 12:27 PM

## 2012-03-25 NOTE — Progress Notes (Signed)
Occupational Therapy Session Note  Patient Details  Name: Shalla Bulluck MRN: [id number] Date of Birth: 1925/03/16  Today's Date: 03/25/2012 Time: 0905-1005 Time Calculation (min): 60 min  Short Term Goals: Week 1:  OT Short Term Goal 1 (Week 1): Short term goals equal LTGs secondary to estimated length of stay.      Skilled Therapeutic Interventions/Progress Updates:    Pt seen for ADL retraining of toileting and dressing with a focus on dynamic standing balance. Pt declined bathing this am.  Pt was able to ambulate around room with 4WW with supervision to retreive clothing from her drawers.  She is mod I with the actual task of toileting and dressing, no LOB.  Pt declined working on homemaking activities this am as she felt that she had practiced those activities quite a bit already. Pt then ambulated to gym where she worked on her HEP of LE strengthening and balance exercises.  Pt needed only minimal cues for proper form for semi squats and side leg lifts.  Good activity tolerance, with patient monitoring herself as to when to take breaks.  Pt then ambulated back to room.  Therapy Documentation Precautions:  Precautions Precautions: Fall Precaution Comments: has broken L ankle x 2 Restrictions Weight Bearing Restrictions: No Pain: Pain Assessment Pain Assessment: No/denies pain ADL: See FIM for current functional status  Therapy/Group: Individual Therapy  Shiloh Southern 03/25/2012, 10:18 AM

## 2012-03-26 ENCOUNTER — Inpatient Hospital Stay (HOSPITAL_COMMUNITY): Payer: Medicare Other | Admitting: Physical Therapy

## 2012-03-26 ENCOUNTER — Inpatient Hospital Stay (HOSPITAL_COMMUNITY): Payer: Medicare Other | Admitting: Occupational Therapy

## 2012-03-26 ENCOUNTER — Inpatient Hospital Stay (HOSPITAL_COMMUNITY): Payer: Medicare Other

## 2012-03-26 LAB — CBC
HCT: 26.6 % — ABNORMAL LOW (ref 36.0–46.0)
MCV: 92.7 fL (ref 78.0–100.0)
Platelets: 256 10*3/uL (ref 150–400)
RBC: 2.87 MIL/uL — ABNORMAL LOW (ref 3.87–5.11)
WBC: 6.7 10*3/uL (ref 4.0–10.5)

## 2012-03-26 LAB — GLUCOSE, CAPILLARY
Glucose-Capillary: 142 mg/dL — ABNORMAL HIGH (ref 70–99)
Glucose-Capillary: 92 mg/dL (ref 70–99)

## 2012-03-26 MED ORDER — WARFARIN SODIUM 5 MG PO TABS
5.0000 mg | ORAL_TABLET | Freq: Once | ORAL | Status: AC
  Administered 2012-03-26: 5 mg via ORAL
  Filled 2012-03-26 (×4): qty 1

## 2012-03-26 NOTE — Progress Notes (Signed)
Physical Therapy Session Note  Patient Details  Name: [name] MRN: [id number] Date of Birth: [birth year]  Today's Date: 03/26/2012 Time: 1478-2956 Time Calculation (min): 54 min  Short Term Goals: Week 1:  PT Short Term Goal 1 (Week 1): pt will perform all bed mobility modified independently PT Short Term Goal 2 (Week 1): pt will perform basic transfers with supervision PT Short Term Goal 3 (Week 1): pt will propel w/c x 150' with supervision for activity tolerance PT Short Term Goal 4 (Week 1): pt will perform gait x 150' with supervision with LRAD PT Short Term Goal 5 (Week 1): pt will ascend/descend 5 steps with superviison  Skilled Therapeutic Interventions/Progress Updates:    This session focused on continued work with the quad cane due to this will be her primary form of AD when going to MD appointments with her caretaker (her caretaker is unable to lift her rollator into the car).  Min guard assist with quad cane overall, min assist with curb and ramp with quad cane and supervision with stairs with a railing and quad cane. We also worked on static and dynamic balance exercises, seated chair exercises and the NuStep for some endurance training.  Car transfers were supervision with quad cane.  See details below.    Therapy Documentation Precautions:  Precautions Precautions: Fall Precaution Comments: has broken L ankle x 2 Restrictions Weight Bearing Restrictions: No    Vital Signs: Therapy Vitals Temp: 97.9 F (36.6 C) Temp src: Oral Pulse Rate: 61  Resp: 18  BP: 131/75 mmHg Patient Position, if appropriate: Sitting Oxygen Therapy SpO2: 98 % O2 Device: None (Room air) Pain: Pain Assessment Pain Assessment: No/denies pain Pain Score: 0-No pain (reports right hip is "sore" from exercising.) Mobility: Transfers Sit to Stand: 5: Supervision;With upper extremity assist;From bed;From chair/3-in-1 Sit to Stand Details: Verbal cues for precautions/safety Sit to  Stand Details (indicate cue type and reason): verbal cues for safe hand placement.   Stand to Sit: 5: Supervision;To bed;With armrests;With upper extremity assist;To chair/3-in-1 Stand to Sit Details (indicate cue type and reason): Verbal cues for precautions/safety Stand to Sit Details: verbal cues for safe technique especially when going to sit on rollator as chair Locomotion : Ambulation Ambulation/Gait Assistance: 4: Min guard;5: Supervision Ambulation Distance (Feet): 300 Feet Assistive device: 4-wheeled walker;Small based quad cane Ambulation/Gait Assistance Details: Verbal cues for technique;Verbal cues for sequencing;Verbal cues for precautions/safety;Manual facilitation for weight shifting Ambulation/Gait Assistance Details: supervision with gait with rollator, min guard asssit with gait due to mild balance deficits with quad cane.  Verbal cues needd for sequencing of feet and quad cane as well as reminders to keep all 4 feet on the ground.   Stairs / Additional Locomotion Stairs: Yes Stairs Assistance: 5: Supervision Stairs Assistance Details: Verbal cues for technique;Verbal cues for sequencing;Verbal cues for precautions/safety;Verbal cues for safe use of DME/AE Stairs Assistance Details (indicate cue type and reason): supervision for safety, verbal cues for leg and cane sequencing Stair Management Technique: One rail Right;Forwards;With cane Number of Stairs: 11  Height of Stairs:  (4-6") Ramp: 5: Supervision;4: Min assist (supervision with rollator, min assist with quad cane) Curb: 4: Min assist (min assist with both rollator and quad cane)     Exercises: General Exercises - Lower Extremity Hip ABduction/ADduction: AROM;Both;15 reps;Seated (adduction only against beach ball) Hip Flexion/Marching: AROM;Both;15 reps;Seated Toe Raises: AROM;Both;20 reps;Seated Heel Raises: AROM;Both;20 reps;Seated Other Exercises Other Exercises: Nu Step level 3 x 5 mins Other Treatments:  Treatments Therapeutic Activity:  Neuro re-ed: in parallel bars, semi tandem stand bil feet multiple trials, feet together EO multiple trials, cone taps alt feet multiple trials.  Car transfer supervision with quad cane, verbal cues for safety (no to pull on moveable door).    See FIM for current functional status  Therapy/Group: Individual Therapy  Lurena Joiner B. Bradely Rudin, PT, DPT [phone]   03/26/2012, 5:06 PM

## 2012-03-26 NOTE — Progress Notes (Signed)
Occupational Therapy Session Note  Patient Details  Name: [name] MRN: [id number] Date of Birth: 1924/08/20  Today's Date: 03/26/2012 Time: 1130-1200 Time Calculation (min): 30 min  Short Term Goals: Week 1:  OT Short Term Goal 1 (Week 1): Short term goals equal LTGs secondary to estimated length of stay.  Skilled Therapeutic Interventions/Progress Updates:  Ambulate with SPC to therapy kitchen to include practice 360 degree turns left and right which patient performed slowly and without need for physical assist. Transferred into standard dining room chair without arm rests. Engaged in dominant, RUE fine motor coordination and strengthening activities while seated.  Transferred from chair to Rollator with supervision, opened kitchen door and ambulated back to room to eat lunch.  Therapy Documentation Precautions:  Precautions Precautions: Fall Precaution Comments: has broken L ankle x 2 Restrictions Weight Bearing Restrictions: No  Pain: 3-4/10 right hip, RN aware, medication to be provided after therapy session  See FIM for current functional status  Therapy/Group: Individual Therapy  Steele Stracener 03/26/2012, 12:06 PM

## 2012-03-26 NOTE — Progress Notes (Signed)
Physical Therapy Session Note  Patient Details  Name: [name] MRN: [id number] Date of Birth: [birth year]  Today's Date: 03/26/2012 Time: 1330-1410 Time Calculation (min): 40 min  Short Term Goals: Week 1:  PT Short Term Goal 1 (Week 1): pt will perform all bed mobility modified independently PT Short Term Goal 2 (Week 1): pt will perform basic transfers with supervision PT Short Term Goal 3 (Week 1): pt will propel w/c x 150' with supervision for activity tolerance PT Short Term Goal 4 (Week 1): pt will perform gait x 150' with supervision with LRAD PT Short Term Goal 5 (Week 1): pt will ascend/descend 5 steps with superviison  Skilled Therapeutic Interventions/Progress Updates:    Focus of treatment on gait training with trial of AD (SPC, quad cane, and 4WW). Pt at distant S/mod I level with 4WW on unit. Pt reported that her hired assistance to take her to/from the doctor's appointment or out into the community is unable to get 4WW into her car. Pt has both SPC (here is in her room and used it to practice) and quad cane at home (will tell her daughter to bring it in). Pt required min A with SPC and reports feeling very unsteady; with quad cane and more practice pt progressed to close S/intermittent steady A and reports feeling more secure with this device than SPC. Encouraged pt that in the next few days to practice with therapy using quad cane to feel more comfortable with this device as she is already at a S/mod I level with the 4WW. Stair negotiation for home entry with rails with S step to pattern descending stairs and reciprocal ascending steps x 12 steps. Pt in agreement. HEP given for LAQ, heel/toe raise and marches all in sitting to work on in her down time while in the recliner.   Therapy Documentation Precautions:  Precautions Precautions: Fall Precaution Comments: has broken L ankle x 2 Restrictions Weight Bearing Restrictions: No   Pain:  Reports soreness in R hip -  premedicated.  Locomotion : Ambulation Ambulation/Gait Assistance: 4: Min guard   See FIM for current functional status  Therapy/Group: Individual Therapy  Karolee Stamps [facility] 03/26/2012, 2:28 PM

## 2012-03-26 NOTE — Progress Notes (Signed)
ANTICOAGULATION CONSULT NOTE - Follow Up Consult  Pharmacy Consult for Coumadin Indication: PAF and secondary stroke prevention  Vital Signs: Temp: 98.4 F (36.9 C) (10/22 0424) Temp src: Oral (10/22 0424) BP: 146/79 mmHg (10/22 0424) Pulse Rate: 63  (10/22 0424)  Labs:  Basename 03/26/12 0630 03/25/12 0718 03/24/12 0615  HGB 8.9* -- --  HCT 26.6* -- --  PLT 256 -- --  APTT -- -- --  LABPROT 21.2* 19.8* 17.2*  INR 1.92* 1.75* 1.44  HEPARINUNFRC -- -- --  CREATININE -- -- --  CKTOTAL -- -- --  CKMB -- -- --  TROPONINI -- -- --    Estimated Creatinine Clearance: 15.2 ml/min (by C-G formula based on Cr of 2.19).  Assessment:78 y.o.female admitted for Cerebral embolism with cerebral infarction and atrial fibrillation, s/p TPA infusion. CT negative for hemorrhage. INR remains subtherapeutic but continues to trend up. Noted drug interaction with amiodarone. No bleeding noted. CBC stable. MD's note indicate patient on lovenox, patient is NOT receiving lovenox.  Goal of Therapy:  INR=2-3 Monitor platelets by anticoagulation protocol: Yes   Plan:  1. Repeat coumadin 5 mg PO x 1 tonight 2. F/u AM INR, 3. Aspirin 81 mg to be stopped once INR > 2  Verlene Mayer, PharmD, [place] Pager [phone] 03/26/2012 1:39 PM

## 2012-03-26 NOTE — Progress Notes (Signed)
Patient ID: [name], female   DOB: Oct 28, 1924, 78 y.o.   MRN: [id number] [name] is a 78 y.o. right-handed female with history of diabetes mellitus with peripheral neuropathy, chronic kidney disease with baseline creatinine 2.3, PAF with pacemaker. Patient was independent prior to admission using a cane and sometimes a walker secondary to peripheral neuropathy and lives with her daughter who is a Publishing rights manager with a local cardiology group. Admitted 03/17/2012 with right-sided weakness and dizziness. Cranial CT scan was unremarkable for acute changes. She did receive TPA. MRI not completed secondary to pacemaker. Echocardiogram with normal systolic function and ejection fraction of 55%. Carotid Dopplers demonstrated 40-59% right ICA stenosis. EKG sinus tachycardia. Neurology services consulted for suspect left brain stroke and placed on Coumadin therapy with bridging of Lovenox. Subcutaneous Lovenox added for DVT prophylaxis.  Subjective/Complaints: Occ cough  No SOB, No CP  Review of Systems  Constitutional: Negative for fever.  Respiratory: Positive for cough. Negative for sputum production, shortness of breath and wheezing.   Gastrointestinal: Negative for abdominal pain, diarrhea and constipation.  Genitourinary: Negative for dysuria.  All other systems reviewed and are negative.     Objective: Vital Signs: Blood pressure 146/79, pulse 63, temperature 98.4 F (36.9 C), temperature source Oral, resp. rate 16, height 4\' 11"  (1.499 m), weight 63.8 kg (140 lb 10.5 oz), SpO2 98.00%. No results found. Results for orders placed during the hospital encounter of 03/19/12 (from the past 72 hour(s))  GLUCOSE, CAPILLARY     Status: Abnormal   Collection Time   03/23/12 11:24 AM      Component Value Range Comment   Glucose-Capillary 251 (*) 70 - 99 mg/dL   GLUCOSE, CAPILLARY     Status: Normal   Collection Time   03/23/12  4:37 PM      Component Value Range Comment   Glucose-Capillary 98  70 - 99 mg/dL   GLUCOSE, CAPILLARY     Status: Normal   Collection Time   03/23/12  8:39 PM      Component Value Range Comment   Glucose-Capillary 86  70 - 99 mg/dL    Comment 1 Notify RN     PROTIME-INR     Status: Abnormal   Collection Time   03/24/12  6:15 AM      Component Value Range Comment   Prothrombin Time 17.2 (*) 11.6 - 15.2 seconds    INR 1.44  0.00 - 1.49   GLUCOSE, CAPILLARY     Status: Abnormal   Collection Time   03/24/12  7:16 AM      Component Value Range Comment   Glucose-Capillary 101 (*) 70 - 99 mg/dL   GLUCOSE, CAPILLARY     Status: Abnormal   Collection Time   03/24/12 11:46 AM      Component Value Range Comment   Glucose-Capillary 300 (*) 70 - 99 mg/dL    Comment 1 Notify RN     GLUCOSE, CAPILLARY     Status: Abnormal   Collection Time   03/24/12  4:45 PM      Component Value Range Comment   Glucose-Capillary 134 (*) 70 - 99 mg/dL   GLUCOSE, CAPILLARY     Status: Abnormal   Collection Time   03/24/12  8:25 PM      Component Value Range Comment   Glucose-Capillary 115 (*) 70 - 99 mg/dL    Comment 1 Notify RN     PROTIME-INR     Status: Abnormal   Collection  Time   03/25/12  7:18 AM      Component Value Range Comment   Prothrombin Time 19.8 (*) 11.6 - 15.2 seconds    INR 1.75 (*) 0.00 - 1.49   GLUCOSE, CAPILLARY     Status: Normal   Collection Time   03/25/12  7:33 AM      Component Value Range Comment   Glucose-Capillary 97  70 - 99 mg/dL    Comment 1 Notify RN     GLUCOSE, CAPILLARY     Status: Abnormal   Collection Time   03/25/12 12:10 PM      Component Value Range Comment   Glucose-Capillary 168 (*) 70 - 99 mg/dL    Comment 1 Notify RN     GLUCOSE, CAPILLARY     Status: Normal   Collection Time   03/25/12  4:41 PM      Component Value Range Comment   Glucose-Capillary 83  70 - 99 mg/dL   GLUCOSE, CAPILLARY     Status: Abnormal   Collection Time   03/25/12  9:01 PM      Component Value Range Comment    Glucose-Capillary 124 (*) 70 - 99 mg/dL   PROTIME-INR     Status: Abnormal   Collection Time   03/26/12  6:30 AM      Component Value Range Comment   Prothrombin Time 21.2 (*) 11.6 - 15.2 seconds    INR 1.92 (*) 0.00 - 1.49   CBC     Status: Abnormal   Collection Time   03/26/12  6:30 AM      Component Value Range Comment   WBC 6.7  4.0 - 10.5 K/uL    RBC 2.87 (*) 3.87 - 5.11 MIL/uL    Hemoglobin 8.9 (*) 12.0 - 15.0 g/dL    HCT 16.1 (*) 09.6 - 46.0 %    MCV 92.7  78.0 - 100.0 fL    MCH 31.0  26.0 - 34.0 pg    MCHC 33.5  30.0 - 36.0 g/dL    RDW 04.5  40.9 - 81.1 %    Platelets 256  150 - 400 K/uL   GLUCOSE, CAPILLARY     Status: Normal   Collection Time   03/26/12  7:14 AM      Component Value Range Comment   Glucose-Capillary 89  70 - 99 mg/dL    Comment 1 Notify RN        HEENT: normal Cardio: RRR Resp: CTA B/L GI: BS positive Extremity:  Pulses positive, no pedal edema Skin:   Intact Neuro: Alert/Oriented, Cranial Nerve II-XII normal, Abnormal Sensory reduced sensation in bilateral feet, Abnormal Motor 4-/5 strength in the R delt,bi,tri,grip and Abnormal FMC Ataxic/ dec FMC Musc/Skel:  Other Hallux valgus GEN: NAD   Assessment/Plan: 1. Functional deficits secondary to L pontine embolic infarct not visualized on CT which require 3+ hours per day of interdisciplinary therapy in a comprehensive inpatient rehab setting. Physiatrist is providing close team supervision and 24 hour management of active medical problems listed below. Physiatrist and rehab team continue to assess barriers to discharge/monitor patient progress toward functional and medical goals. FIM: FIM - Bathing Bathing Steps Patient Completed: Chest;Right Arm;Left Arm;Abdomen;Front perineal area;Buttocks;Right upper leg;Right lower leg (including foot);Left upper leg;Left lower leg (including foot) Bathing: 6: More than reasonable amount of time  FIM - Upper Body Dressing/Undressing Upper body  dressing/undressing steps patient completed: Thread/unthread right bra strap;Thread/unthread left bra strap;Hook/unhook bra;Thread/unthread right sleeve of pullover shirt/dresss;Thread/unthread left  sleeve of pullover shirt/dress;Put head through opening of pull over shirt/dress;Pull shirt over trunk Upper body dressing/undressing: 6: More than reasonable amount of time FIM - Lower Body Dressing/Undressing Lower body dressing/undressing steps patient completed: Thread/unthread right underwear leg;Thread/unthread left underwear leg;Pull underwear up/down;Thread/unthread right pants leg;Thread/unthread left pants leg Lower body dressing/undressing: 6: More than reasonable amount of time  FIM - Toileting Toileting steps completed by patient: Adjust clothing prior to toileting;Adjust clothing after toileting;Performs perineal hygiene Toileting Assistive Devices: Grab bar or rail for support Toileting: 6: More than reasonable amount of time  FIM - Diplomatic Services operational officer Devices: Art gallery manager Transfers: 5-To toilet/BSC: Supervision (verbal cues/safety issues);5-From toilet/BSC: Supervision (verbal cues/safety issues)  FIM - Banker Devices: Teacher, music: 5: Supine > Sit: Supervision (verbal cues/safety issues);5: Bed > Chair or W/C: Supervision (verbal cues/safety issues);5: Chair or W/C > Bed: Supervision (verbal cues/safety issues)  FIM - Locomotion: Wheelchair Distance: 40 Locomotion: Wheelchair: 5: Travels 50 - 149 ft, turns around, maneuvers to table, bed or toilet, negotiates 3% grade: modified independent FIM - Locomotion: Ambulation Locomotion: Ambulation Assistive Devices:  (4 wheeled walker) Ambulation/Gait Assistance: 5: Supervision Locomotion: Ambulation: 5: Travels 150 ft or more with supervision/safety issues  Comprehension Comprehension Mode: Auditory Comprehension: 6-Follows complex conversation/direction:  With extra time/assistive device  Expression Expression Mode: Verbal Expression: 6-Expresses complex ideas: With extra time/assistive device  Social Interaction Social Interaction: 6-Interacts appropriately with others with medication or extra time (anti-anxiety, antidepressant).  Problem Solving Problem Solving: 6-Solves complex problems: With extra time  Memory Memory: 6-More than reasonable amt of time Medical Problem List and Plan:  1. Subcortical left brain infarcts  2. DVT Prophylaxis/Anticoagulation: Chronic Coumadin therapy with Lovenox for bridging until INR greater than 2.00  3. Pain Management: Ultram 50 mg every 12 hours as needed, Lidoderm patch. Monitor with increased mobility  4. Neuropsych: This patient is capable of making decisions on his/her own behalf.  5. PAF/pacemaker. Amiodarone 200 mg daily. Cardiac rate control  6. Chronic kidney disease. Baseline creatinine 2.3. Continue Lasix 40 mg daily as well as Calcitrol 0.25 mcg every other day. Followup chemistries  7. Diabetes mellitus with peripheral neuropathy. Hemoglobin A1c 6.1. Continue Lantus insulin 16 units daily and NovoLog 8 units 3 times a day. Check blood sugars a.c. and at bedtime  8. CHF. Continue Lasix 40 mg daily and monitor for any signs of fluid overload  9. Chronic urinary tract infection. Keflex 250 mg daily prophylactically  10. Hyperlipidemia. Zetia/Zocor  11. Anemia of chronic disease. Latest hemoglobin 8.9 Monitor for any bleeding episodes and followup CBC per pharmacy protocol 12.  Insomia, resume home dose ambien slept well  LOS (Days) 7 A FACE TO FACE EVALUATION WAS PERFORMED  Ash Mcelwain E 03/26/2012, 8:28 AM

## 2012-03-26 NOTE — Progress Notes (Signed)
Occupational Therapy Session Note  Patient Details  Name: [name] MRN: [id number] Date of Birth: 12/12/1924  Today's Date: 03/26/2012 Time: 1000-1100 Time Calculation (min): 60 min  Short Term Goals: Week 1:  OT Short Term Goal 1 (Week 1): Short term goals equal LTGs secondary to estimated length of stay.  Skilled Therapeutic Interventions/Progress Updates:    Pt seen for BADL retraining of B/D at shower level with a focus on dynamic standing balance. Pt was able to ambulate within room to retreive clothing from drawers.  Supervision with ambulation, mod I with BADLs.  Discussed obstacles of using 4WW with getting to doctor appointments, pt used single point cane prior to CVA.  Pt now has improved right grasp strength to hold cane.  She ambulated around room with cane with close supervision. Discussed with PTs trial use of cane.  Pt worked on Chiropodist transfers with supervision.  Therapy Documentation Precautions:  Precautions Precautions: Fall Precaution Comments: has broken L ankle x 2 Restrictions Weight Bearing Restrictions: No      Pain: Pain Assessment Pain Assessment: No/denies pain Pain Score: 0-No pain ADL: See FIM for current functional status  Therapy/Group: Individual Therapy  Zolton Dowson 03/26/2012, 11:55 AM

## 2012-03-27 ENCOUNTER — Inpatient Hospital Stay (HOSPITAL_COMMUNITY): Payer: Medicare Other | Admitting: Physical Therapy

## 2012-03-27 ENCOUNTER — Inpatient Hospital Stay (HOSPITAL_COMMUNITY): Payer: Medicare Other | Admitting: Occupational Therapy

## 2012-03-27 LAB — GLUCOSE, CAPILLARY: Glucose-Capillary: 149 mg/dL — ABNORMAL HIGH (ref 70–99)

## 2012-03-27 LAB — PROTIME-INR: INR: 1.92 — ABNORMAL HIGH (ref 0.00–1.49)

## 2012-03-27 MED ORDER — SALINE SPRAY 0.65 % NA SOLN
1.0000 | Freq: Two times a day (BID) | NASAL | Status: DC
  Administered 2012-03-27 – 2012-03-28 (×3): 1 via NASAL
  Filled 2012-03-27 (×2): qty 44

## 2012-03-27 MED ORDER — WARFARIN SODIUM 5 MG PO TABS
5.0000 mg | ORAL_TABLET | Freq: Once | ORAL | Status: AC
  Administered 2012-03-27: 5 mg via ORAL
  Filled 2012-03-27: qty 1

## 2012-03-27 NOTE — Progress Notes (Signed)
Occupational Therapy Session Note  Patient Details  Name: [name] MRN: [id number] Date of Birth: [birth year]  Today's Date: 03/27/2012 Time: 1010-1103 Time Calculation (min): 53 min  Short Term Goals: No short term goals set  Skilled Therapeutic Interventions/Progress Updates:    Pt completed dressing and grooming tasks independently using her walker within her room.  Pt then ambulated to kitchen with RW to prepare a grilled cheese sandwich.  Mod I with simple meal prep.  Pt did well with RW, but recommended that she continue using her 4ww for meal prep activities as the seat will allow for easier transport of items from refrigerator to stovetop.    Therapy Documentation Precautions:  Precautions Precautions: Fall Precaution Comments: has broken L ankle x 2 Restrictions Weight Bearing Restrictions: No  Pain: no c/o pain   ADL: See FIM for current functional status  Therapy/Group: Individual Therapy  SAGUIER,JULIA 03/27/2012, 11:42 AM

## 2012-03-27 NOTE — Progress Notes (Signed)
Physical Therapy Session Note  Patient Details  Name: Mckenize Mezera MRN: [id number] Date of Birth: April 18, 1925  Today's Date: 03/27/2012 Time: 2130-8657 Time Calculation (min): 25 min  Short Term Goals: See LTGs  Skilled Therapeutic Interventions/Progress Updates:    See below for details.  Therapy Documentation Precautions:  Precautions Precautions: Fall Precaution Comments: has broken L ankle x 2 Restrictions Weight Bearing Restrictions: No Pain: Pain Assessment Pain Assessment: No/denies pain Mobility:  Bed mobility on regular bed with mod I.  Sit to stand with Mod I using rollator.  Car transfer to 2 different heights with supervision. Locomotion :  Gait on unit with rollator x 150' mod I, 15 steps with 2 rails with supervision.      See FIM for current functional status  Therapy/Group: Individual Therapy  Georges Mouse 03/27/2012, 2:06 PM

## 2012-03-27 NOTE — Progress Notes (Signed)
Called to room by NT, Patient with nose bleed this am, pt stated" blew my nose and then it bleed. I have them from time to time"Bleeding from right nare, pressure held to area by patient for approximately 3-5 minutes, bleeding stopped. Roberts-VonCannon, Cloteal Isaacson Elon Jester

## 2012-03-27 NOTE — Progress Notes (Signed)
Nutrition Brief Note  Patient identified on the Malnutrition Screening Tool (MST) Report.   Patient has lost 3 lb in the past week (2% weight loss), however, intake has been very good and patient is still overweight.    Body mass index is 28.14 kg/(m^2). Pt meets criteria for overweight based on current BMI.   Current diet order is CHO-modified medium, patient is consuming approximately 75-100% of meals at this time. Labs and medications reviewed.   PO intake has been great.  No nutrition interventions warranted at this time. If nutrition issues arise, please consult RD.   Joaquin Courts, RD, LDN, CNSC Pager# [phone] After Hours Pager# [phone]

## 2012-03-27 NOTE — Progress Notes (Signed)
Occupational Therapy Session Note  Patient Details  Name: Zetta Lukowski MRN: [id number] Date of Birth: [birth year]  Today's Date: 03/27/2012 Time: 1115-1200 Time Calculation (min): 45 min  Short Term Goals: Week 1:  OT Short Term Goal 1 (Week 1): Short term goals equal LTGs secondary to estimated length of stay.  Skilled Therapeutic Interventions/Progress Updates:  Patient seated in recliner working crossword puzzle with RUE!  Engaged in therapeutic activity to include ambulate with Rollator to the gift shop on first floor, walk throughout gift shop, walk outside to rest while sitting in the sun, ambulate back up to CIR (4th floor), toilet transfer with rollator and grab bar.  Patient was made Mod. I in her room this AM and was very excited and confident that she would not have to call anyone if she needed to use the bathroom!.  Therapy Documentation Precautions:  Precautions Precautions: Fall Precaution Comments: has broken L ankle x 2 Restrictions Weight Bearing Restrictions: No Pain: Pain Assessment Pain Assessment: No/denies pain  See FIM for current functional status  Therapy/Group: Individual Therapy  Columbus Ice 03/27/2012, 12:45 PM

## 2012-03-27 NOTE — Progress Notes (Signed)
Patient in the gym with physical therapy,patient with nose bleeding from right nare. Patient holding kleenex to nose. Patient stated "it's stopped now, this happens from time to time and only one this side, bleeding stopped.Discussed with patient to call if started again, verbalized understanding. Roberts-VonCannon, Avian Konigsberg Elon Jester

## 2012-03-27 NOTE — Discharge Summary (Signed)
[name], TAKESHITA NO.:  [id number]  MEDICAL RECORD NO.:  [id number]  LOCATION:  4147                         FACILITY:  [facility]  PHYSICIAN:  Erick Colace, M.D.DATE OF BIRTH:  August 08, 1924  DATE OF ADMISSION:  03/19/2012 DATE OF DISCHARGE:  03/28/2012                              DISCHARGE SUMMARY   DISCHARGE DIAGNOSES: 1. Subcortical thrombotic left brain infarction. 2. Chronic Coumadin therapy, pain management, paroxysmal atrial     fibrillation with pacemaker, chronic kidney disease with baseline     creatinine 2.3, diabetes mellitus with peripheral neuropathy,     congestive heart failure with diastolic dysfunction, chronic     urinary tract infection, hyperlipidemia and anemia of chronic     disease.  This is an 78 year old right-handed female with diabetes mellitus with peripheral neuropathy, PAF with pacemaker who was admitted on March 17, 2012, with right-sided weakness and dizziness.  The patient used a cane prior to admission.  Lives with her daughter.  Cranial CT scan showed to be unremarkable.  She did receive tPA.  MRI not completed secondary to pacemaker.  Echocardiogram with normal systolic function with ejection fraction of 55%.  Carotid Dopplers demonstrated 40-59% right ICA stenosis.  EKG, sinus tachycardia.  Neurology Services consulted for suspect left brain stroke and placed on Coumadin therapy with bridging of Lovenox.  The patient remained on Keflex daily for chronic urinary tract infection.  She was admitted for comprehensive rehab program.  PAST MEDICAL HISTORY:  See discharge diagnoses.  SOCIAL HISTORY:  Lives with her daughter, 1 level home.  Eleven steps to entry.  Functional history prior to admission was independent with a cane.  She does not drive.  She is retired.  Functional status upon admission to rehab services was moderate assist to ambulate 70 feet with 1-2 person handheld assistance.  PHYSICAL EXAMINATION:   VITAL SIGNS:  Blood pressure 131/75, pulse 61, respirations 18, temperature 97.9. GENERAL:  This was an alert female in no acute distress, oriented x3. She followed simple commands. LUNGS:  Clear to auscultation. CARDIAC:  Rate controlled. ABDOMEN:  Soft, nontender.  Good bowel sounds.  NEURO:  She demonstrated some decreased coordination of fine motor skills.  Right pronator drift.  REHABILITATION HOSPITAL COURSE:  The patient was admitted to inpatient rehab services with therapies initiated on a 3-hour daily basis consisting of physical therapy, occupational therapy, and rehabilitation nursing.  The following issues were addressed during the patient's rehabilitation stay.  Pertaining to Ms. [name]'s subcortical left brain infarction remains stable, she would follow up Neurology Services.  She remained on chronic Coumadin therapy for history of PAF, stroke, prophylaxis.  Latest INR of 1.92.  She had been receiving Lovenox for bridging until INR greater than 2.  Her baby aspirin will be stopped once INR greater than 2.0.  Pain management with the use of Ultram and a Lidoderm patch as needed.  Her blood pressures remained well controlled. She exhibited no signs of fluid overload.  She did have a history of diabetes mellitus, maintained on insulin therapy.  She had a hemoglobin A1c of 6.1.  Her daughter was well versed on her diabetic care.  She continued on chronic  Keflex for history of chronic urinary tract infection.  Chronic anemia with hemoglobin 8.9 and monitored.  She exhibited no signs of bleeding episodes.  The patient received weekly collaborative interdisciplinary team conferences to discuss estimated length of stay, family teaching, and any barriers to discharge.  She was continent of bowel and bladder, modified independent in her room, needing very minimal assist for lower body activities of daily living. She exhibited no signs of unsafe behavior.  Full family teaching  was completed with her daughter and a cousin was also to come and stay short time as necessary.  Ongoing therapies would be dictated as per rehab services.  DISCHARGE MEDICATIONS: 1. Amiodarone 200 mg daily. 2. Aspirin 81 mg daily until INR greater than 2.0. 3. Calcitriol 0.25 mcg daily. 4. Keflex 250 mg daily. 5. Zetia 10 mg daily. 6. Lasix 40 mg daily. 7. Lantus insulin 16 units subcutaneously daily. 8. MiraLax 17 g daily with 8 ounces of water. 9. Potassium chloride 10 mEq daily. 10.Zocor 10 mg daily. 11.Ultram 50 mg every 12 hours as needed for pain. 12.Coumadin latest dose of 5 mg adjusted accordingly for an INR of 2.0     to 3.0.  SPECIAL INSTRUCTIONS:  A diabetic diet.  A home health nurse had been arranged.  Check INR on Monday, April 01, 2012, with results to Dr. Royann Shivers at [phone], fax number [phone].  The patient would follow up Dr. Claudette Laws on April 18, 2012, for followup appointment. Dr. Delia Heady, follow up in 1 month.     Mariam Dollar, P.A.   ______________________________ Erick Colace, M.D.    DA/MEDQ  D:  03/27/2012  T:  03/27/2012  Job:  [id number]  cc:   Pramod P. Pearlean Brownie, MD Thurmon Fair, MD Hal T. Stoneking, M.D.

## 2012-03-27 NOTE — Progress Notes (Signed)
Patient ID: [name], female   DOB: September 16, 1924, 78 y.o.   MRN: [id number] [name] is a 78 y.o. right-handed female with history of diabetes mellitus with peripheral neuropathy, chronic kidney disease with baseline creatinine 2.3, PAF with pacemaker. Patient was independent prior to admission using a cane and sometimes a walker secondary to peripheral neuropathy and lives with her daughter who is a Publishing rights manager with a local cardiology group. Admitted 03/17/2012 with right-sided weakness and dizziness. Cranial CT scan was unremarkable for acute changes. She did receive TPA. MRI not completed secondary to pacemaker. Echocardiogram with normal systolic function and ejection fraction of 55%. Carotid Dopplers demonstrated 40-59% right ICA stenosis. EKG sinus tachycardia. Neurology services consulted for suspect left brain stroke and placed on Coumadin therapy with bridging of Lovenox. Subcutaneous Lovenox added for DVT prophylaxis.  Subjective/Complaints: Occ cough  No SOB, No CP  Review of Systems  Constitutional: Negative for fever.  Respiratory: Positive for cough. Negative for sputum production, shortness of breath and wheezing.   Gastrointestinal: Negative for abdominal pain, diarrhea and constipation.  Genitourinary: Negative for dysuria.  All other systems reviewed and are negative.     Objective: Vital Signs: Blood pressure 149/56, pulse 62, temperature 98.2 F (36.8 C), temperature source Oral, resp. rate 18, height 4\' 11"  (1.499 m), weight 63.2 kg (139 lb 5.3 oz), SpO2 100.00%. No results found. Results for orders placed during the hospital encounter of 03/19/12 (from the past 72 hour(s))  GLUCOSE, CAPILLARY     Status: Abnormal   Collection Time   03/24/12  8:25 PM      Component Value Range Comment   Glucose-Capillary 115 (*) 70 - 99 mg/dL    Comment 1 Notify RN     PROTIME-INR     Status: Abnormal   Collection Time   03/25/12  7:18 AM      Component Value Range  Comment   Prothrombin Time 19.8 (*) 11.6 - 15.2 seconds    INR 1.75 (*) 0.00 - 1.49   GLUCOSE, CAPILLARY     Status: Normal   Collection Time   03/25/12  7:33 AM      Component Value Range Comment   Glucose-Capillary 97  70 - 99 mg/dL    Comment 1 Notify RN     GLUCOSE, CAPILLARY     Status: Abnormal   Collection Time   03/25/12 12:10 PM      Component Value Range Comment   Glucose-Capillary 168 (*) 70 - 99 mg/dL    Comment 1 Notify RN     GLUCOSE, CAPILLARY     Status: Normal   Collection Time   03/25/12  4:41 PM      Component Value Range Comment   Glucose-Capillary 83  70 - 99 mg/dL   GLUCOSE, CAPILLARY     Status: Abnormal   Collection Time   03/25/12  9:01 PM      Component Value Range Comment   Glucose-Capillary 124 (*) 70 - 99 mg/dL   PROTIME-INR     Status: Abnormal   Collection Time   03/26/12  6:30 AM      Component Value Range Comment   Prothrombin Time 21.2 (*) 11.6 - 15.2 seconds    INR 1.92 (*) 0.00 - 1.49   CBC     Status: Abnormal   Collection Time   03/26/12  6:30 AM      Component Value Range Comment   WBC 6.7  4.0 - 10.5 K/uL  RBC 2.87 (*) 3.87 - 5.11 MIL/uL    Hemoglobin 8.9 (*) 12.0 - 15.0 g/dL    HCT 16.1 (*) 09.6 - 46.0 %    MCV 92.7  78.0 - 100.0 fL    MCH 31.0  26.0 - 34.0 pg    MCHC 33.5  30.0 - 36.0 g/dL    RDW 04.5  40.9 - 81.1 %    Platelets 256  150 - 400 K/uL   GLUCOSE, CAPILLARY     Status: Normal   Collection Time   03/26/12  7:14 AM      Component Value Range Comment   Glucose-Capillary 89  70 - 99 mg/dL    Comment 1 Notify RN     GLUCOSE, CAPILLARY     Status: Abnormal   Collection Time   03/26/12 11:25 AM      Component Value Range Comment   Glucose-Capillary 142 (*) 70 - 99 mg/dL    Comment 1 Notify RN     GLUCOSE, CAPILLARY     Status: Normal   Collection Time   03/26/12  4:45 PM      Component Value Range Comment   Glucose-Capillary 92  70 - 99 mg/dL   GLUCOSE, CAPILLARY     Status: Abnormal   Collection Time    03/26/12  9:12 PM      Component Value Range Comment   Glucose-Capillary 169 (*) 70 - 99 mg/dL   PROTIME-INR     Status: Abnormal   Collection Time   03/27/12  5:18 AM      Component Value Range Comment   Prothrombin Time 21.2 (*) 11.6 - 15.2 seconds    INR 1.92 (*) 0.00 - 1.49   GLUCOSE, CAPILLARY     Status: Normal   Collection Time   03/27/12  7:02 AM      Component Value Range Comment   Glucose-Capillary 81  70 - 99 mg/dL    Comment 1 Notify RN     GLUCOSE, CAPILLARY     Status: Abnormal   Collection Time   03/27/12 11:57 AM      Component Value Range Comment   Glucose-Capillary 149 (*) 70 - 99 mg/dL    Comment 1 Notify RN     GLUCOSE, CAPILLARY     Status: Abnormal   Collection Time   03/27/12  4:39 PM      Component Value Range Comment   Glucose-Capillary 136 (*) 70 - 99 mg/dL      HEENT: normal Cardio: RRR Gr 4/6 SEM Resp: CTA B/L GI: BS positive Extremity:  Pulses positive, no pedal edema Skin:   Intact Neuro: Alert/Oriented, Cranial Nerve II-XII normal, Abnormal Sensory reduced sensation in bilateral feet, Abnormal Motor 4-/5 strength in the R delt,bi,tri,grip and Abnormal FMC Ataxic/ dec FMC Musc/Skel:  Other Hallux valgus GEN: NAD   Assessment/Plan: 1. Functional deficits secondary to L pontine embolic infarct not visualized on CT medically stable for D/C F/u PCP,Cardiology, PM&R    FIM: FIM - Bathing Bathing Steps Patient Completed: Chest;Right Arm;Left Arm;Abdomen;Front perineal area;Buttocks;Right upper leg;Right lower leg (including foot);Left upper leg;Left lower leg (including foot) Bathing: 6: More than reasonable amount of time  FIM - Upper Body Dressing/Undressing Upper body dressing/undressing steps patient completed: Thread/unthread right bra strap;Thread/unthread left bra strap;Hook/unhook bra;Thread/unthread right sleeve of pullover shirt/dresss;Thread/unthread left sleeve of pullover shirt/dress;Put head through opening of pull over  shirt/dress;Pull shirt over trunk Upper body dressing/undressing: 7: Complete Independence: No helper FIM - Lower Body  Dressing/Undressing Lower body dressing/undressing steps patient completed: Thread/unthread right underwear leg;Thread/unthread left underwear leg;Pull underwear up/down;Thread/unthread right pants leg;Thread/unthread left pants leg Lower body dressing/undressing: 6: More than reasonable amount of time  FIM - Toileting Toileting steps completed by patient: Adjust clothing prior to toileting;Performs perineal hygiene;Adjust clothing after toileting Toileting Assistive Devices: Grab bar or rail for support Toileting: 6: More than reasonable amount of time  FIM - Diplomatic Services operational officer Devices: Art gallery manager Transfers: 6-To toilet/ BSC;6-From toilet/BSC  FIM - Banker Devices: Engineer, materials: 6: More than reasonable amt of time  FIM - Locomotion: Wheelchair Distance: 40 Locomotion: Wheelchair: 0: Activity did not occur FIM - Locomotion: Ambulation Locomotion: Ambulation Assistive Devices: Designer, industrial/product (rollator) Ambulation/Gait Assistance: 6: Modified independent (Device/Increase time) Locomotion: Ambulation: 6: Travels 150 ft or more with assistive device/no helper  Comprehension Comprehension Mode: Auditory Comprehension: 6-Follows complex conversation/direction: With extra time/assistive device  Expression Expression Mode: Verbal Expression: 6-Expresses complex ideas: With extra time/assistive device  Social Interaction Social Interaction: 6-Interacts appropriately with others with medication or extra time (anti-anxiety, antidepressant).  Problem Solving Problem Solving: 6-Solves complex problems: With extra time  Memory Memory: 6-More than reasonable amt of time Medical Problem List and Plan:  1. Subcortical left brain infarcts  2. DVT Prophylaxis/Anticoagulation: Chronic  Coumadin therapy with Lovenox for bridging until INR greater than 2.00  3. Pain Management: Ultram 50 mg every 12 hours as needed, Lidoderm patch. Monitor with increased mobility  4. Neuropsych: This patient is capable of making decisions on his/her own behalf.  5. PAF/pacemaker. Amiodarone 200 mg daily. Cardiac rate control  6. Chronic kidney disease. Baseline creatinine 2.3. Continue Lasix 40 mg daily as well as Calcitrol 0.25 mcg every other day. Followup chemistries  7. Diabetes mellitus with peripheral neuropathy. Hemoglobin A1c 6.1. Continue Lantus insulin 16 units daily and NovoLog 8 units 3 times a day. Check blood sugars a.c. and at bedtime  8. CHF. Continue Lasix 40 mg daily and monitor for any signs of fluid overload  9. Chronic urinary tract infection. Keflex 250 mg daily prophylactically  10. Hyperlipidemia. Zetia/Zocor  11. Anemia of chronic disease. Latest hemoglobin 8.9 Monitor for any bleeding episodes and followup CBC per pharmacy protocol 12.  Insomia, resume home dose ambien slept well  LOS (Days) 8 A FACE TO FACE EVALUATION WAS PERFORMED  Jossie Smoot E 03/27/2012, 5:48 PM

## 2012-03-27 NOTE — Patient Care Conference (Signed)
Inpatient RehabilitationTeam Conference Note Date: 03/27/2012   Time: 10:30 AM    Patient Name: [name]      Medical Record Number: [id number]  Date of Birth: Oct 09, 1924 Sex: Female         Room/Bed: 4147/4147-01 Payor Info: Payor: MEDICARE  Plan: MEDICARE PART A AND B  Product Type: *No Product type*     Admitting Diagnosis: LT CVA  Admit Date/Time:  03/19/2012  3:34 PM Admission Comments: No comment available   Primary Diagnosis:  CVA (cerebral infarction) Principal Problem: CVA (cerebral infarction)  Patient Active Problem List   Diagnosis Date Noted  . CVA (cerebral infarction) 03/20/2012  . Hyperlipidemia LDL goal < 70 03/18/2012  . Cerebral embolism with cerebral infarction 03/17/2012  . Hemiplegia, unspecified, affecting dominant side 03/17/2012  . CHF (congestive heart failure) 12/15/2011  . Chronic combined systolic and diastolic CHF, NYHA class 2, in combination with AS, MR 12/15/2011  . CKD (chronic kidney disease) stage 5, GFR less than 15 ml/min 12/14/2011  . Near syncope with AF/AT 12/13/2011  . Aortic stenosis, severe 12/12/2011  . Mitral regurgitation, moderate to severe 12/12/2011  . PAF (paroxysmal atrial fibrillation) 12/12/2011    Class: Chronic  . Diabetes mellitus, insulin dependent (IDDM), controlled 12/12/2011    Class: Chronic  . History of drug-induced hemolytic anemia quinine 12/12/2011    Class: History of  . SSS (sick sinus syndrome) with medtronic pacemaker, placed 6 years ago, with micro dislodgement of atrial lead, though still paces--for CHB and paf 12/12/2011  . UTI (lower urinary tract infection), curent and history of UTIs on Keflex daily 12/12/2011  . CAD (coronary artery disease), cardiac cath  12/12/2011  . Anemia of chronic disease 12/12/2011    Class: Chronic    Expected Discharge Date: Expected Discharge Date: 03/29/12  Team Members Present: Physician: Dr. Claudette Laws Social Worker Present: Amada Jupiter, LCSW Nurse  Present: Gregor Hams, RN PT Present: Edman Circle, PT OT Present: Leonette Monarch, Felipa Eth, OT SLP Present: Fae Pippin, SLP Other (Discipline and Name): Tora Duck, PPS Coordinator     Current Status/Progress Goal Weekly Team Focus  Medical   no SOB,No CP, occ HA  maintain medical stability  d/c planning   Bowel/Bladder   Continent of bowel and bladder.  Remain continent of bowel and bladder.  Monitor   Swallow/Nutrition/ Hydration             ADL's   supervision with ambulation with 4WW, mod I with bathing, dressing, toileting  mod I with BADLs and simple meal prep/laundry  ADL retraining, dynamic standing balance activities, RUE strengthening, pt education   Mobility   supervison with rollator with gait>150', working on getting to supervision with quad cane since this is what she uses when she goes to MD appointments with her caregiver (due to the rollator is too difficult to get into the car).    mod I  gait with rollator, quad cane, endurance training, stair training, transfers balance   Communication             Safety/Cognition/ Behavioral Observations            Pain   Patient c/o headache pain. Relieved by Tylenol 650 mg.  Pain level less than 3 on scale of 0-10-.  Monitor for new onset of pain and assess effectiveness of intervention and modify as needed.   Skin   n/a  No new skin breakdown and infection.  Monitor for any new skin breakdown q shift.      *  See Interdisciplinary Assessment and Plan and progress notes for long and short-term goals  Barriers to Discharge: no 24/7 sup    Possible Resolutions to Barriers:  upgrade to mod I level    Discharge Planning/Teaching Needs:  HOme with daughter, cousin to be with short time.  Comfortable with discharge Friday      Team Discussion:  Pt reaching goals quicker, made mod/i in room today.  Daughter to come to do family education tomorrow. Aware coumadin not therapeutic yet.  Revisions to Treatment  Plan:  Moving up discharge date due to progressed quicker than expected   Continued Need for Acute Rehabilitation Level of Care: The patient requires daily medical management by a physician with specialized training in physical medicine and rehabilitation for the following conditions: Daily direction of a multidisciplinary physical rehabilitation program to ensure safe treatment while eliciting the highest outcome that is of practical value to the patient.: Yes Daily medical management of patient stability for increased activity during participation in an intensive rehabilitation regime.: Yes Daily analysis of laboratory values and/or radiology reports with any subsequent need for medication adjustment of medical intervention for : Neurological problems  [name], [name] 03/27/2012, 3:27 PM

## 2012-03-27 NOTE — Discharge Summary (Signed)
  Discharge summary job # [id number]

## 2012-03-27 NOTE — Progress Notes (Signed)
Social Work Patient ID: [name], female   DOB: 05/22/1925, 78 y.o.   MRN: [id number] Met with pt and spoke with daughter via telephone regarding team conference goals-mod/i level and discharge 10/24. Aware moving up one day sooner due to pt's progress.  Have scheduled family education for tomorrow at 11:00 with daughter. Discussed DME and follow up will make arrangements and have ready for tomorrow.  All in agreement with plans.

## 2012-03-27 NOTE — Progress Notes (Signed)
Physical Therapy Note  Patient Details  Name: [name] MRN: [id number] Date of Birth: [birth year] Today's Date: 03/27/2012  0900-0955 (55 minutes) individual Pain: no complaint of pain Focus of treatment: gait training/endurance RW vs swivel RW vs SBQC for gait outside of home. Treatment: Pt reports that person who transports her in car cannot lift her current rollator in out of car. Pt performed ambulation 100 feet ,  up/down curb with , around obstacles first with SBQC (min to close SBA) , then swivel RW (SBA)  and then standard RW (SBA). Pt experienced partial loss of balance using SBQC. She reports she feels secure with standard RW and that driver could fold up and place in car.  Xai Frerking,JIM 03/27/2012, 7:45 AM

## 2012-03-27 NOTE — Progress Notes (Signed)
ANTICOAGULATION CONSULT NOTE - Follow Up Consult  Pharmacy Consult for Coumadin Indication: PAF and secondary stroke prevention  Vital Signs: Temp: 98.2 F (36.8 C) (10/23 1543) Temp src: Oral (10/23 1543) BP: 149/56 mmHg (10/23 1543)  Labs:  Basename 03/27/12 0518 03/26/12 0630 03/25/12 0718  HGB -- 8.9* --  HCT -- 26.6* --  PLT -- 256 --  APTT -- -- --  LABPROT 21.2* 21.2* 19.8*  INR 1.92* 1.92* 1.75*  HEPARINUNFRC -- -- --  CREATININE -- -- --  CKTOTAL -- -- --  CKMB -- -- --  TROPONINI -- -- --    Estimated Creatinine Clearance: 15.2 ml/min (by C-G formula based on Cr of 2.19).  Assessment:78 y.o.female admitted for Cerebral embolism with cerebral infarction and atrial fibrillation, s/p TPA infusion. CT negative for hemorrhage. INR remains subtherapeutic. Noted drug interaction with amiodarone. No bleeding noted. CBC stable. MD's note indicate patient on lovenox, patient is NOT receiving lovenox.  Goal of Therapy:  INR=2-3 Monitor platelets by anticoagulation protocol: Yes   Plan:  1. Repeat coumadin 5 mg PO x 1 tonight 2. F/u AM INR, 3. Aspirin 81 mg to be stopped once INR > 2  Link Snuffer, PharmD, BCPS Clinical Pharmacist [phone] 03/27/2012 5:35 PM

## 2012-03-28 ENCOUNTER — Inpatient Hospital Stay (HOSPITAL_COMMUNITY): Payer: Medicare Other | Admitting: Physical Therapy

## 2012-03-28 DIAGNOSIS — G8110 Spastic hemiplegia affecting unspecified side: Secondary | ICD-10-CM

## 2012-03-28 DIAGNOSIS — I6330 Cerebral infarction due to thrombosis of unspecified cerebral artery: Secondary | ICD-10-CM

## 2012-03-28 DIAGNOSIS — Z5189 Encounter for other specified aftercare: Secondary | ICD-10-CM

## 2012-03-28 LAB — PROTIME-INR
INR: 2.11 — ABNORMAL HIGH (ref 0.00–1.49)
Prothrombin Time: 22.8 seconds — ABNORMAL HIGH (ref 11.6–15.2)

## 2012-03-28 LAB — GLUCOSE, CAPILLARY: Glucose-Capillary: 152 mg/dL — ABNORMAL HIGH (ref 70–99)

## 2012-03-28 MED ORDER — TRAMADOL HCL 50 MG PO TABS: 50.0000 mg | ORAL_TABLET | Freq: Two times a day (BID) | ORAL | Status: DC | PRN

## 2012-03-28 MED ORDER — POTASSIUM CHLORIDE ER 10 MEQ PO TBCR: 10.0000 meq | EXTENDED_RELEASE_TABLET | Freq: Every day | ORAL | Status: DC

## 2012-03-28 MED ORDER — INSULIN GLARGINE 100 UNIT/ML ~~LOC~~ SOLN: 16.0000 [IU] | Freq: Every morning | SUBCUTANEOUS | Status: DC

## 2012-03-28 MED ORDER — EZETIMIBE 10 MG PO TABS: 10.0000 mg | ORAL_TABLET | Freq: Every day | ORAL | Status: DC

## 2012-03-28 MED ORDER — SALINE SPRAY 0.65 % NA SOLN: 1.0000 | Freq: Two times a day (BID) | NASAL | Status: DC

## 2012-03-28 MED ORDER — FUROSEMIDE 40 MG PO TABS: 40.0000 mg | ORAL_TABLET | Freq: Every morning | ORAL | Status: DC

## 2012-03-28 MED ORDER — SIMVASTATIN 10 MG PO TABS: 10.0000 mg | ORAL_TABLET | Freq: Every day | ORAL | Status: DC

## 2012-03-28 MED ORDER — WARFARIN SODIUM 5 MG PO TABS: 5.0000 mg | ORAL_TABLET | Freq: Every day | ORAL | Status: DC

## 2012-03-28 MED ORDER — WARFARIN SODIUM 5 MG PO TABS
5.0000 mg | ORAL_TABLET | Freq: Every day | ORAL | Status: DC
  Filled 2012-03-28: qty 1

## 2012-03-28 NOTE — Patient Care Conference (Deleted)
Inpatient RehabilitationTeam Conference Note Date: 03/27/2012   Time: 11:10 AM    Patient Name: [name]      Medical Record Number: [id number]  Date of Birth: 09-30-1924 Sex: Female         Room/Bed: 4147/4147-01 Payor Info: Payor: MEDICARE  Plan: MEDICARE PART A AND B  Product Type: *No Product type*     Admitting Diagnosis: LT CVA  Admit Date/Time:  03/19/2012  3:34 PM Admission Comments: No comment available   Primary Diagnosis:  CVA (cerebral infarction) Principal Problem: CVA (cerebral infarction)  Patient Active Problem List   Diagnosis Date Noted  . CVA (cerebral infarction) 03/20/2012  . Hyperlipidemia LDL goal < 70 03/18/2012  . Cerebral embolism with cerebral infarction 03/17/2012  . Hemiplegia, unspecified, affecting dominant side 03/17/2012  . CHF (congestive heart failure) 12/15/2011  . Chronic combined systolic and diastolic CHF, NYHA class 2, in combination with AS, MR 12/15/2011  . CKD (chronic kidney disease) stage 5, GFR less than 15 ml/min 12/14/2011  . Near syncope with AF/AT 12/13/2011  . Aortic stenosis, severe 12/12/2011  . Mitral regurgitation, moderate to severe 12/12/2011  . PAF (paroxysmal atrial fibrillation) 12/12/2011    Class: Chronic  . Diabetes mellitus, insulin dependent (IDDM), controlled 12/12/2011    Class: Chronic  . History of drug-induced hemolytic anemia quinine 12/12/2011    Class: History of  . SSS (sick sinus syndrome) with medtronic pacemaker, placed 6 years ago, with micro dislodgement of atrial lead, though still paces--for CHB and paf 12/12/2011  . UTI (lower urinary tract infection), curent and history of UTIs on Keflex daily 12/12/2011  . CAD (coronary artery disease), cardiac cath  12/12/2011  . Anemia of chronic disease 12/12/2011    Class: Chronic    Expected Discharge Date: Expected Discharge Date: 03/29/12  Team Members Present: Physician: Dr. Claudette Laws Social Worker Present: Amada Jupiter, LCSW Nurse  Present: Gregor Hams, RN PT Present: Edman Circle, PT OT Present: Leonette Monarch, Felipa Eth, OT SLP Present: Fae Pippin, SLP Other (Discipline and Name): Tora Duck, PPS Coordinator     Current Status/Progress Goal Weekly Team Focus  Medical   no SOB,No CP, occ HA  maintain medical stability  d/c planning   Bowel/Bladder   Continent of bowel and bladder.  Remain continent of bowel and bladder.  Monitor   Swallow/Nutrition/ Hydration             ADL's   supervision with ambulation with 4WW, mod I with bathing, dressing, toileting  mod I with BADLs and simple meal prep/laundry  ADL retraining, dynamic standing balance activities, RUE strengthening, pt education   Mobility   supervison with rollator with gait>150', working on getting to supervision with quad cane since this is what she uses when she goes to MD appointments with her caregiver (due to the rollator is too difficult to get into the car).    mod I  gait with rollator, quad cane, endurance training, stair training, transfers balance   Communication             Safety/Cognition/ Behavioral Observations            Pain   Patient c/o headache pain. Relieved by Tylenol 650 mg.  Pain level less than 3 on scale of 0-10-.  Monitor for new onset of pain and assess effectiveness of intervention and modify as needed.   Skin   n/a  No new skin breakdown and infection.  Monitor for any new skin breakdown q shift.      *  See Interdisciplinary Assessment and Plan and progress notes for long and short-term goals  Barriers to Discharge: no 24/7 sup    Possible Resolutions to Barriers:  upgrade to mod I level    Discharge Planning/Teaching Needs:  HOme with daughter, cousin to be with short time.  Comfortable with discharge Friday      Team Discussion:  Pt progressing well, making mod/i in room today.  Coumadin becoming therapuetic.  Cousin to stay with short time while daughter works.  Need family education for  stairs  Revisions to Treatment Plan:  None   Continued Need for Acute Rehabilitation Level of Care: The patient requires daily medical management by a physician with specialized training in physical medicine and rehabilitation for the following conditions: Daily direction of a multidisciplinary physical rehabilitation program to ensure safe treatment while eliciting the highest outcome that is of practical value to the patient.: Yes Daily medical management of patient stability for increased activity during participation in an intensive rehabilitation regime.: Yes Daily analysis of laboratory values and/or radiology reports with any subsequent need for medication adjustment of medical intervention for : Neurological problems  Kamera Dubas, [name] 03/28/2012, 8:51 AM

## 2012-03-28 NOTE — Progress Notes (Signed)
Social Work Discharge Note Discharge Note  The overall goal for the admission was met for:   Discharge location: Yes-HOME WITH DAUGHTER WILL HAVE 24 HR WITH COUSIN & DAUGHTER  Length of Stay: Yes-9 DAYS  Discharge activity level: Yes-MOD/I LEVEL  Home/community participation: Yes  Services provided included: MD, RD, PT, OT, SLP, TR, Pharmacy and SW  Financial Services: Medicare and Private Insurance: [employer]-SECONDARY  Follow-up services arranged: Home Health: [facility]-PT, OT, RN, DME: [facility]-ROLLING WALKER, TUB [facility]EN[facility] and Patient/Family has no preference for HH/DME agencies  Comments (or additional information):DAUGHTER HERE FOR FAMILY EDUCATION TODAY PRIOR TO DISCHARGE.  Patient/Family verbalized understanding of follow-up arrangements: Yes  Individual responsible for coordination of the follow-up plan: SELF & LAURA-DAUGHTER  Confirmed correct DME delivered: Lucy Chris 03/28/2012    Lucy Chris

## 2012-03-28 NOTE — Progress Notes (Signed)
Occupational Therapy Discharge Summary  Patient Details  Name: [name] MRN: [id number] Date of Birth: 16-Jul-1924  Today's Date: 03/28/2012    Patient has met 11 of 11 long term goals due to improved activity tolerance, improved balance, postural control, functional use of  RIGHT upper extremity and improved coordination.  Patient to discharge at overall Modified Independent level.  Patient's care partner is independent to provide the necessary physical assistance at discharge.    Reasons goals not met: n/a  Recommendation: No further OT recommended at this time.  Equipment: tub transfer bench  Reasons for discharge: treatment goals met  Patient/family agrees with progress made and goals achieved: Yes - Brief family education with daughter this morning.  OT Discharge Precautions/Restrictions  Restrictions Weight Bearing Restrictions: No   ADL Mod I Vision/Perception  Vision - History Baseline Vision: Wears glasses all the time Patient Visual Report: No change from baseline Perception Perception: Within Functional Limits Praxis Praxis: Intact  Cognition Overall Cognitive Status: Appears within functional limits for tasks assessed Orientation Level: Oriented X4 Sensation Sensation Stereognosis: Appears Intact Hot/Cold: Appears Intact Proprioception: Appears Intact Coordination Gross Motor Movements are Fluid and Coordinated: Yes Fine Motor Movements are Fluid and Coordinated: Yes Motor WFL for basic functional tasks   Mobility  Mod I with 4WW or RW within home environment    Trunk/Postural Assessment  Cervical Assessment Cervical Assessment: Within Functional Limits Thoracic Assessment Thoracic Assessment: Within Functional Limits Lumbar Assessment Lumbar Assessment: Within Functional Limits Postural Control Postural Control: Within Functional Limits  Balance Dynamic Sitting Balance Dynamic Sitting - Level of Assistance: 7: Independent Static  Standing Balance Static Standing - Level of Assistance: 7: Independent Extremity/Trunk Assessment RUE Assessment RUE Assessment: Within Functional Limits (functional within PROM limitations) LUE Assessment LUE Assessment: Within Functional Limits  See FIM for current functional status  SAGUIER,JULIA 03/28/2012, 11:22 AM

## 2012-03-28 NOTE — Significant Event (Signed)
Hypoglycemic Event  CBG: 68  Treatment: 15 GM carbohydrate snack  Symptoms: None  Follow-up CBG: Time:0740 CBG Result:134  Possible Reasons for Event: Unknown  Comments/MD notified:N/A, patient given 4 ounces of juice to drink, patient to Bathroom than drank juice, breakfast at bedside now on recheck blood glucose up to 134    Roberts-VonCannon, Odaly Peri Elon Jester  Remember to initiate Hypoglycemia Order Set & complete

## 2012-03-28 NOTE — Progress Notes (Signed)
Physical Therapy Discharge Summary  Patient Details  Name: [name] MRN: [id number] Date of Birth: 10/01/1924  Today's Date: 03/28/2012 Time: 7829-5621 (32 minutes)   Pt and daughter educated on mobility including bed mobility, transfers, gait with RW vs 4wheeled walker, stair training and car transfers.  Pt is at modified I level with mobility and supervision for steps and car for safety.  Daughter able to provide safe supervision.  Patient has met 9 of 9 long term goals due to improved activity tolerance and improved balance.  Patient to discharge at an ambulatory level Modified Independent.   Patient's care partner is independent to provide the necessary supervision assistance at discharge.-  Reasons goals not met: n/a  Recommendation:  Patient will benefit from ongoing skilled PT services in home health setting to continue to advance safe functional mobility, address ongoing impairments in balance, endurance, strength, and minimize fall risk.  Equipment: RW  Reasons for discharge: treatment goals met and discharge from hospital  Patient/family agrees with progress made and goals achieved: Yes  PT Discharge Precautions/Restrictions Precautions Precaution Comments: has broken L ankle x 2 Restrictions Weight Bearing Restrictions: No    Pain No c/o pain   Vision/Perception  Vision - History Baseline Vision: Wears glasses all the time Patient Visual Report: No change from baseline Perception Perception: Within Functional Limits Praxis Praxis: Intact  Cognition Overall Cognitive Status: Appears within functional limits for tasks assessed Arousal/Alertness: Awake/alert Orientation Level: Oriented X4 Sensation Sensation Stereognosis: Appears Intact Hot/Cold: Appears Intact Proprioception: Appears Intact Coordination Gross Motor Movements are Fluid and Coordinated: Yes Fine Motor Movements are Fluid and Coordinated: Yes    Mobility Bed Mobility Bed Mobility:  Supine to Sit;Sit to Supine Supine to Sit: 7: Independent Sitting - Scoot to Edge of Bed: 7: Independent Sit to Supine: 7: Independent Scooting to HOB: 7: Independent Transfers Sit to Stand: 6: Modified independent (Device/Increase time) Stand to Sit: 6: Modified independent (Device/Increase time) Locomotion  Ambulation Ambulation: Yes Ambulation/Gait Assistance: 6: Modified independent (Device/Increase time) Ambulation Distance (Feet): 200 Feet Assistive device: 4-wheeled walker Gait Gait: Yes Gait Pattern: Within Functional Limits Gait Pattern: Within Functional Limits Stairs / Additional Locomotion Stairs: Yes Stairs Assistance: 5: Supervision Stair Management Technique: One rail Right;Forwards Number of Stairs: 4  Height of Stairs: 7   Trunk/Postural Assessment  Cervical Assessment Cervical Assessment: Within Functional Limits Thoracic Assessment Thoracic Assessment: Within Functional Limits Lumbar Assessment Lumbar Assessment: Within Functional Limits Postural Control Postural Control: Within Functional Limits  Balance Balance Balance Assessed: Yes Static Sitting Balance Static Sitting - Level of Assistance: 7: Independent Dynamic Sitting Balance Dynamic Sitting - Level of Assistance: 7: Independent Static Standing Balance Static Standing - Level of Assistance: 7: Independent Extremity Assessment  RUE Assessment RUE Assessment: Within Functional Limits (functional within PROM limitations) LUE Assessment LUE Assessment: Within Functional Limits RLE Assessment RLE Assessment: Within Functional Limits RLE Strength RLE Overall Strength Comments: grossly 4/5 LLE Assessment LLE Assessment: Within Functional Limits  See FIM for current functional status  Newell Coral 03/28/2012, 12:24 PM  I agree with the above assessment. Tedd Sias

## 2012-03-28 NOTE — Progress Notes (Signed)
Patient and daughter received verbal and written discharge instructions from Deatra Ina, [place]. Both deny questions or concerns, aware of follow up appointments and received prescriptions for medications. Patient states she uses a pill  Box at home for medications set up for one week at a time, she checks her own blood sugars and administers her own insulin at home with her Novolog and Lantus pens, denies any questions or concerns with her diabetes management. Patient also states she has life line at home and has regular follow ups with her physicians. Personal belongings packed by family and staff, patient taken down to private vehicle by NT via wheelchair. Roberts-VonCannon, Jaylia Pettus Elon Jester

## 2012-04-01 ENCOUNTER — Other Ambulatory Visit (HOSPITAL_COMMUNITY): Payer: Self-pay | Admitting: *Deleted

## 2012-04-02 ENCOUNTER — Encounter (HOSPITAL_COMMUNITY)
Admission: RE | Admit: 2012-04-02 | Discharge: 2012-04-02 | Disposition: A | Discharge status: Home / Self Care | Payer: MEDICARE | Source: Ambulatory Visit | Attending: Nephrology | Admitting: Nephrology

## 2012-04-02 LAB — IRON AND TIBC
Saturation Ratios: 8 % — ABNORMAL LOW (ref 20–55)
TIBC: 340 ug/dL (ref 250–470)

## 2012-04-02 MED ORDER — EPOETIN ALFA 20000 UNIT/ML IJ SOLN
INTRAMUSCULAR | Status: AC
  Administered 2012-04-02: 15000 [IU]
  Filled 2012-04-02: qty 1

## 2012-04-02 MED ORDER — EPOETIN ALFA 20000 UNIT/ML IJ SOLN: 15000.0000 [IU] | INTRAMUSCULAR | Status: DC

## 2012-04-03 ENCOUNTER — Encounter (HOSPITAL_COMMUNITY)
Admission: RE | Admit: 2012-04-03 | Discharge: 2012-04-03 | Disposition: A | Discharge status: Home / Self Care | Payer: MEDICARE | Source: Ambulatory Visit | Attending: Surgery | Admitting: Surgery

## 2012-04-03 MED ORDER — DIPHENHYDRAMINE HCL 25 MG PO TABS
25.0000 mg | ORAL_TABLET | Freq: Once | ORAL | Status: AC
  Administered 2012-04-03: 25 mg via ORAL
  Filled 2012-04-03: qty 1

## 2012-04-03 MED ORDER — ACETAMINOPHEN 325 MG PO TABS
650.0000 mg | ORAL_TABLET | Freq: Once | ORAL | Status: AC
  Administered 2012-04-03: 650 mg via ORAL

## 2012-04-03 NOTE — Progress Notes (Signed)
Pt left with her daughter before speaking with a nurse and labs were not done before the patient left.

## 2012-04-04 LAB — TYPE AND SCREEN: Unit division: 0

## 2012-04-08 ENCOUNTER — Other Ambulatory Visit (HOSPITAL_COMMUNITY): Payer: Self-pay | Admitting: *Deleted

## 2012-04-09 ENCOUNTER — Encounter (HOSPITAL_COMMUNITY)
Admission: RE | Admit: 2012-04-09 | Discharge: 2012-04-09 | Disposition: A | Discharge status: Home / Self Care | Payer: MEDICARE | Source: Ambulatory Visit | Attending: Nephrology | Admitting: Nephrology

## 2012-04-09 DIAGNOSIS — N185 Chronic kidney disease, stage 5: Secondary | ICD-10-CM | POA: Insufficient documentation

## 2012-04-09 DIAGNOSIS — N039 Chronic nephritic syndrome with unspecified morphologic changes: Secondary | ICD-10-CM | POA: Insufficient documentation

## 2012-04-09 DIAGNOSIS — D631 Anemia in chronic kidney disease: Secondary | ICD-10-CM | POA: Insufficient documentation

## 2012-04-09 LAB — COMPREHENSIVE METABOLIC PANEL
Alkaline Phosphatase: 96 U/L (ref 39–117)
BUN: 50 mg/dL — ABNORMAL HIGH (ref 6–23)
CO2: 27 mEq/L (ref 19–32)
Calcium: 9.4 mg/dL (ref 8.4–10.5)
GFR calc Af Amer: 19 mL/min — ABNORMAL LOW (ref >90)
GFR calc non Af Amer: 17 mL/min — ABNORMAL LOW (ref >90)
Glucose, Bld: 254 mg/dL — ABNORMAL HIGH (ref 70–99)
Potassium: 4.4 mEq/L (ref 3.5–5.1)
Total Protein: 6.6 g/dL (ref 6.0–8.3)

## 2012-04-09 LAB — CBC
Hemoglobin: 8.7 g/dL — ABNORMAL LOW (ref 12.0–15.0)
MCHC: 31.2 g/dL (ref 30.0–36.0)
Platelets: 237 10*3/uL (ref 150–400)
RDW: 15.4 % (ref 11.5–15.5)

## 2012-04-09 LAB — PHOSPHORUS: Phosphorus: 4.1 mg/dL (ref 2.3–4.6)

## 2012-04-09 MED ORDER — EPOETIN ALFA 20000 UNIT/ML IJ SOLN: 15000.0000 [IU] | INTRAMUSCULAR | Status: DC

## 2012-04-09 MED ORDER — EPOETIN ALFA 20000 UNIT/ML IJ SOLN
INTRAMUSCULAR | Status: AC
  Administered 2012-04-09: 15000 [IU]
  Filled 2012-04-09: qty 1

## 2012-04-10 MED FILL — Epoetin Alfa Inj 20000 Unit/ML: INTRAMUSCULAR | Qty: 1 | Status: AC

## 2012-04-15 ENCOUNTER — Other Ambulatory Visit (HOSPITAL_COMMUNITY): Payer: Self-pay | Admitting: *Deleted

## 2012-04-16 ENCOUNTER — Other Ambulatory Visit: Payer: Self-pay | Admitting: Cardiovascular Disease

## 2012-04-16 ENCOUNTER — Other Ambulatory Visit (HOSPITAL_COMMUNITY): Payer: Self-pay | Admitting: *Deleted

## 2012-04-16 ENCOUNTER — Encounter (HOSPITAL_COMMUNITY)
Admission: RE | Admit: 2012-04-16 | Discharge: 2012-04-16 | Disposition: A | Discharge status: Home / Self Care | Payer: MEDICARE | Source: Ambulatory Visit | Attending: Nephrology | Admitting: Nephrology

## 2012-04-16 DIAGNOSIS — Z7901 Long term (current) use of anticoagulants: Secondary | ICD-10-CM

## 2012-04-16 LAB — CBC
HCT: 28.3 % — ABNORMAL LOW (ref 36.0–46.0)
Hemoglobin: 8.7 g/dL — ABNORMAL LOW (ref 12.0–15.0)
RBC: 3.25 MIL/uL — ABNORMAL LOW (ref 3.87–5.11)
WBC: 6.1 10*3/uL (ref 4.0–10.5)

## 2012-04-16 LAB — PROTIME-INR: INR: 1 (ref 0.00–1.49)

## 2012-04-16 MED ORDER — EPOETIN ALFA 20000 UNIT/ML IJ SOLN
INTRAMUSCULAR | Status: AC
  Filled 2012-04-16: qty 1

## 2012-04-16 MED ORDER — EPOETIN ALFA 20000 UNIT/ML IJ SOLN
15000.0000 [IU] | INTRAMUSCULAR | Status: DC
  Administered 2012-04-16: 15000 [IU] via SUBCUTANEOUS

## 2012-04-17 MED FILL — Epoetin Alfa Inj 20000 Unit/ML: INTRAMUSCULAR | Qty: 1 | Status: AC

## 2012-04-18 ENCOUNTER — Encounter: Payer: Self-pay | Admitting: Physical Medicine & Rehabilitation

## 2012-04-18 ENCOUNTER — Ambulatory Visit (HOSPITAL_BASED_OUTPATIENT_CLINIC_OR_DEPARTMENT_OTHER): Payer: MEDICARE | Admitting: Physical Medicine & Rehabilitation

## 2012-04-18 ENCOUNTER — Encounter: Payer: MEDICARE | Attending: Physical Medicine & Rehabilitation

## 2012-04-18 VITALS — BP 177/75 | HR 76 | Resp 14 | Ht 59.0 in | Wt 141.8 lb

## 2012-04-18 DIAGNOSIS — G8190 Hemiplegia, unspecified affecting unspecified side: Secondary | ICD-10-CM

## 2012-04-18 DIAGNOSIS — I6350 Cerebral infarction due to unspecified occlusion or stenosis of unspecified cerebral artery: Secondary | ICD-10-CM

## 2012-04-18 DIAGNOSIS — I639 Cerebral infarction, unspecified: Secondary | ICD-10-CM

## 2012-04-18 DIAGNOSIS — I69959 Hemiplegia and hemiparesis following unspecified cerebrovascular disease affecting unspecified side: Secondary | ICD-10-CM | POA: Insufficient documentation

## 2012-04-18 DIAGNOSIS — E119 Type 2 diabetes mellitus without complications: Secondary | ICD-10-CM | POA: Insufficient documentation

## 2012-04-18 DIAGNOSIS — G8191 Hemiplegia, unspecified affecting right dominant side: Secondary | ICD-10-CM

## 2012-04-18 NOTE — Progress Notes (Signed)
Subjective:    Patient ID: [name], female    DOB: 04/24/1925, 78 y.o.   MRN: [id number] This is an 78 year old right-handed female with diabetes mellitus with  peripheral neuropathy, PAF with pacemaker who was admitted on March 17, 2012, with right-sided weakness and dizziness. The patient used a  cane prior to admission. Lives with her daughter. Cranial CT scan  showed to be unremarkable. She did receive tPA. MRI not completed  secondary to pacemaker. Echocardiogram with normal systolic function  with ejection fraction of 55%. Carotid Dopplers demonstrated 40-59%  right ICA stenosis. EKG, sinus tachycardia. Neurology Services  consulted for suspect left brain stroke and placed on Coumadin therapy  with bridging of Lovenox. The patient remained on Keflex daily for  chronic urinary tract infection  HPI Living with daughter who works long hours at the hospital. Has a hired caregiver to drive her cardiology eval next week GI evaluation next week for positive blood in stool sample and drop in hemoglobin which required a transfusion Also has chronic anemia and has received Procrit recently as well as in the past No falls  Therapy is coming out of the house PT and OT, winding up next week Pain Inventory Average Pain 3 Pain Right Now 0 My pain is intermittent  In the last 24 hours, has pain interfered with the following? General activity 0 Relation with others 0 Enjoyment of life 0 What TIME of day is your pain at its worst? evening Sleep (in general) Good  Pain is worse with: weather change Pain improves with: medication Relief from Meds: 8  Mobility walk with assistance use a walker ability to climb steps?  yes do you drive?  no  Function retired  Neuro/Psych tremor trouble walking  Prior Studies Any changes since last visit?  no  Physicians involved in your care Any changes since last visit?  no   History reviewed. No pertinent family history. History    Social History  . Marital Status: Widowed    Spouse Name: N/A    Number of Children: N/A  . Years of Education: N/A   Social History Main Topics  . Smoking status: Former Smoker -- 1.0 packs/day for 30 years    Types: Cigarettes    Quit date: 06/05/1980  . Smokeless tobacco: None  . Alcohol Use: No  . Drug Use: No  . Sexually Active: None   Other Topics Concern  . None   Social History Narrative   Lives with daughter [name] who is NP for [employer] [facility] [employer] and Vascular   Past Surgical History  Procedure Date  . Pacemaker insertion November 2007    Medtronic EnRhythm dual-chamber [id number]  . Cardiac catheterization 2005    60% stenosis of 1st diagnoal branch, 60-70% stenosis of posterolateral brach of distal RCA, preserved LV with EF 50-55%, diastolic dysfunction  . Eye surgery retinal eye bleed  . Abdominal hysterectomy    Past Medical History  Diagnosis Date  . Diabetes mellitus, insulin dependent (IDDM), controlled   . Chronic kidney disease, stage 4, severely decreased GFR     likely related to diabetic and hypertensive nephropathy, no evidence of renal artery stenosis by duplex US in 2008  . Mitral valve disorder   . Pacemaker November 2007    s/p placement complete heart block status  . Near syncope 12/12/2011  . Aortic stenosis, moderate to severe 12/12/2011  . Chronic degenerative aortic valve disease 12/12/2011  . PAF (paroxysmal atrial fibrillation) 12/12/2011  . H/O  hemolytic anemia due to drugs 12/12/2011  . SSS (sick sinus syndrome) with medtronic pacemaker, placed 6 years ago, with micro dislodgement of atrial lead, though still paces--for CHB and paf 12/12/2011  . UTI (lower urinary tract infection), curent and history of UTIs on Keflex daily 12/12/2011  . CAD (coronary artery disease), cardiac cath  12/12/2011  . Anemia, secondary to chronic disease 12/12/2011  . Pacemaker complications     Known chronic dysfunction of atrial lead with high pacing trhesholds but  good sensing.  . Chronic combined systolic and diastolic CHF, NYHA class 2, in combination with AS, MR 12/15/2011  . Bleeding of eye retinal eye bleed   BP 177/75  Pulse 76  Resp 14  Ht 4\' 11"  (1.499 m)  Wt 141 lb 12.8 oz (64.32 kg)  BMI 28.64 kg/m2  SpO2 100%    Review of Systems  Musculoskeletal: Positive for gait problem.  Neurological: Positive for tremors.  All other systems reviewed and are negative.       Objective:   Physical Exam  Constitutional: She is oriented to person, place, and time. She appears well-developed and well-nourished. No distress.  HENT:  Head: Normocephalic and atraumatic.  Eyes: Conjunctivae normal and EOM are normal. Pupils are equal, round, and reactive to light.  Neurological: She is alert and oriented to person, place, and time. No sensory deficit. Coordination and gait normal.  Reflex Scores:      Tricep reflexes are 3+ on the right side and 2+ on the left side.      Bicep reflexes are 3+ on the right side and 2+ on the left side.      Brachioradialis reflexes are 3+ on the right side and 2+ on the left side.      Patellar reflexes are 3+ on the right side and 2+ on the left side.      Achilles reflexes are 3+ on the right side and 2+ on the left side.      4/5 strength in the right deltoid, biceps, triceps, grip, hip flexor, knee extensors, ankle dorsi flexion plantar flexor 5/5 strength on the left side  Psychiatric: She has a normal mood and affect.          Assessment & Plan:  1. Left subcortical infarct with right hemiparesis. Finished up home health therapy recommends outpatient therapy. Will make referral to neuro rehabilitation for PT OT  RTC 16mo

## 2012-04-18 NOTE — Patient Instructions (Addendum)
A referral has been made daily neuro rehabilitation department on [address] next to the [facility] neurology offices I will see in one month

## 2012-04-23 ENCOUNTER — Encounter (HOSPITAL_COMMUNITY)
Admission: RE | Admit: 2012-04-23 | Discharge: 2012-04-23 | Disposition: A | Discharge status: Home / Self Care | Payer: MEDICARE | Source: Ambulatory Visit | Attending: Nephrology | Admitting: Nephrology

## 2012-04-23 MED ORDER — EPOETIN ALFA 20000 UNIT/ML IJ SOLN
15000.0000 [IU] | INTRAMUSCULAR | Status: DC
  Administered 2012-04-23: 14:00:00 via SUBCUTANEOUS

## 2012-04-23 MED ORDER — EPOETIN ALFA 20000 UNIT/ML IJ SOLN
INTRAMUSCULAR | Status: AC
  Filled 2012-04-23: qty 1

## 2012-04-26 ENCOUNTER — Other Ambulatory Visit: Payer: Self-pay | Admitting: Gastroenterology

## 2012-04-26 DIAGNOSIS — D649 Anemia, unspecified: Secondary | ICD-10-CM

## 2012-04-30 ENCOUNTER — Encounter (HOSPITAL_COMMUNITY)
Admission: RE | Admit: 2012-04-30 | Discharge: 2012-04-30 | Disposition: A | Discharge status: Home / Self Care | Payer: MEDICARE | Source: Ambulatory Visit | Attending: Nephrology | Admitting: Nephrology

## 2012-04-30 LAB — POCT HEMOGLOBIN-HEMACUE: Hemoglobin: 8.5 g/dL — ABNORMAL LOW (ref 12.0–15.0)

## 2012-04-30 MED ORDER — EPOETIN ALFA 20000 UNIT/ML IJ SOLN
15000.0000 [IU] | INTRAMUSCULAR | Status: DC
  Administered 2012-04-30: 15000 [IU] via SUBCUTANEOUS

## 2012-04-30 MED ORDER — EPOETIN ALFA 20000 UNIT/ML IJ SOLN
INTRAMUSCULAR | Status: AC
  Filled 2012-04-30: qty 1

## 2012-05-01 LAB — IRON AND TIBC
Iron: 20 ug/dL — ABNORMAL LOW (ref 42–135)
TIBC: 413 ug/dL (ref 250–470)
UIBC: 393 ug/dL (ref 125–400)

## 2012-05-06 ENCOUNTER — Other Ambulatory Visit: Payer: Self-pay | Admitting: Cardiovascular Disease

## 2012-05-06 ENCOUNTER — Other Ambulatory Visit (HOSPITAL_COMMUNITY): Payer: Self-pay | Admitting: *Deleted

## 2012-05-07 ENCOUNTER — Encounter (HOSPITAL_COMMUNITY)
Admission: RE | Admit: 2012-05-07 | Discharge: 2012-05-07 | Disposition: A | Discharge status: Home / Self Care | Payer: MEDICARE | Source: Ambulatory Visit | Attending: Nephrology | Admitting: Nephrology

## 2012-05-07 DIAGNOSIS — D631 Anemia in chronic kidney disease: Secondary | ICD-10-CM | POA: Insufficient documentation

## 2012-05-07 DIAGNOSIS — N185 Chronic kidney disease, stage 5: Secondary | ICD-10-CM | POA: Insufficient documentation

## 2012-05-07 MED ORDER — EPOETIN ALFA 20000 UNIT/ML IJ SOLN
INTRAMUSCULAR | Status: AC
  Administered 2012-05-07: 15000 [IU] via SUBCUTANEOUS
  Filled 2012-05-07: qty 1

## 2012-05-07 MED ORDER — SODIUM CHLORIDE 0.9 % IV SOLN
1020.0000 mg | Freq: Once | INTRAVENOUS | Status: AC
  Administered 2012-05-07: 1020 mg via INTRAVENOUS
  Filled 2012-05-07: qty 34

## 2012-05-07 MED ORDER — EPOETIN ALFA 20000 UNIT/ML IJ SOLN
15000.0000 [IU] | INTRAMUSCULAR | Status: DC
  Administered 2012-05-07: 15000 [IU] via SUBCUTANEOUS

## 2012-05-08 ENCOUNTER — Encounter (HOSPITAL_COMMUNITY)
Admission: RE | Admit: 2012-05-08 | Discharge: 2012-05-08 | Disposition: A | Discharge status: Home / Self Care | Payer: MEDICARE | Source: Ambulatory Visit | Attending: Nephrology | Admitting: Nephrology

## 2012-05-08 MED ORDER — DIPHENHYDRAMINE HCL 25 MG PO CAPS
25.0000 mg | ORAL_CAPSULE | Freq: Once | ORAL | Status: AC
  Administered 2012-05-08: 25 mg via ORAL

## 2012-05-08 MED ORDER — ACETAMINOPHEN 325 MG PO TABS
ORAL_TABLET | ORAL | Status: AC
  Administered 2012-05-08: 650 mg via ORAL
  Filled 2012-05-08: qty 2

## 2012-05-08 MED ORDER — ACETAMINOPHEN 325 MG PO TABS
650.0000 mg | ORAL_TABLET | Freq: Once | ORAL | Status: AC
  Administered 2012-05-08: 650 mg via ORAL

## 2012-05-08 MED ORDER — DIPHENHYDRAMINE HCL 25 MG PO CAPS
ORAL_CAPSULE | ORAL | Status: AC
  Administered 2012-05-08: 25 mg via ORAL
  Filled 2012-05-08: qty 1

## 2012-05-09 LAB — TYPE AND SCREEN: ABO/RH(D): O POS

## 2012-05-13 ENCOUNTER — Other Ambulatory Visit (HOSPITAL_COMMUNITY): Payer: Self-pay | Admitting: *Deleted

## 2012-05-13 ENCOUNTER — Other Ambulatory Visit: Payer: Self-pay | Admitting: Gastroenterology

## 2012-05-13 ENCOUNTER — Ambulatory Visit
Admission: RE | Admit: 2012-05-13 | Discharge: 2012-05-13 | Disposition: A | Discharge status: Home / Self Care | Payer: MEDICARE | Source: Ambulatory Visit | Attending: Gastroenterology | Admitting: Gastroenterology

## 2012-05-13 DIAGNOSIS — D649 Anemia, unspecified: Secondary | ICD-10-CM

## 2012-05-14 ENCOUNTER — Encounter (HOSPITAL_COMMUNITY): Payer: MEDICARE

## 2012-05-16 ENCOUNTER — Encounter (HOSPITAL_COMMUNITY)
Admission: RE | Admit: 2012-05-16 | Discharge: 2012-05-16 | Disposition: A | Discharge status: Home / Self Care | Payer: MEDICARE | Source: Ambulatory Visit | Attending: Nephrology | Admitting: Nephrology

## 2012-05-16 LAB — POCT HEMOGLOBIN-HEMACUE: Hemoglobin: 11.5 g/dL — ABNORMAL LOW (ref 12.0–15.0)

## 2012-05-16 MED ORDER — EPOETIN ALFA 20000 UNIT/ML IJ SOLN: 15000.0000 [IU] | INTRAMUSCULAR | Status: DC

## 2012-05-17 ENCOUNTER — Encounter: Payer: MEDICARE | Attending: Physical Medicine & Rehabilitation

## 2012-05-17 ENCOUNTER — Ambulatory Visit (HOSPITAL_BASED_OUTPATIENT_CLINIC_OR_DEPARTMENT_OTHER): Payer: MEDICARE | Admitting: Physical Medicine & Rehabilitation

## 2012-05-17 ENCOUNTER — Encounter: Payer: Self-pay | Admitting: Physical Medicine & Rehabilitation

## 2012-05-17 VITALS — BP 145/68 | HR 82 | Resp 18 | Ht 59.0 in | Wt 140.6 lb

## 2012-05-17 DIAGNOSIS — E119 Type 2 diabetes mellitus without complications: Secondary | ICD-10-CM | POA: Insufficient documentation

## 2012-05-17 DIAGNOSIS — I6340 Cerebral infarction due to embolism of unspecified cerebral artery: Secondary | ICD-10-CM

## 2012-05-17 DIAGNOSIS — G8190 Hemiplegia, unspecified affecting unspecified side: Secondary | ICD-10-CM

## 2012-05-17 DIAGNOSIS — I69959 Hemiplegia and hemiparesis following unspecified cerebrovascular disease affecting unspecified side: Secondary | ICD-10-CM | POA: Insufficient documentation

## 2012-05-17 NOTE — Patient Instructions (Signed)
I have sent another referral to the neuro rehabilitation at [facility] which is on  [address] I recommend no driving I recommend followup with ophthalmology to assess your vision I'll see you in one month

## 2012-05-17 NOTE — Progress Notes (Signed)
Subjective:    Patient ID: [name], female    DOB: 1924-07-14, 78 y.o.   MRN: [id number] This is an 78 year old right-handed female with diabetes mellitus with  peripheral neuropathy, PAF with pacemaker who was admitted on March 17, 2012, with right-sided weakness and dizziness. The patient used a  cane prior to admission. Lives with her daughter. Cranial CT scan  showed to be unremarkable. She did receive tPA. MRI not completed  secondary to pacemaker. Echocardiogram with normal systolic function  with ejection fraction of 55%. Carotid Dopplers demonstrated 40-59%  right ICA stenosis. EKG, sinus tachycardia. Neurology Services  consulted for suspect left brain stroke and placed on Coumadin therapy  with bridging of Lovenox.  HPI Has completed outpatient therapy. Still is walking with a walker around the house Still having difficulty with writing No falls at home Received blood transfusion for anemia X2 since last visit    Pain Inventory Average Pain 0 Pain Right Now 0 My pain is no pain  In the last 24 hours, has pain interfered with the following? General activity 0 Relation with others 0 Enjoyment of life 0 What TIME of day is your pain at its worst? no pain Sleep (in general) Fair  Pain is worse with: no pain Pain improves with: no pain Relief from Meds: no pain  Mobility walk with assistance use a walker ability to climb steps?  yes do you drive?  no use a wheelchair  Function not employed: date last employed 1960  Neuro/Psych trouble walking  Prior Studies Any changes since last visit?  no  Physicians involved in your care Any changes since last visit?  no   History reviewed. No pertinent family history. History   Social History  . Marital Status: Widowed    Spouse Name: N/A    Number of Children: N/A  . Years of Education: N/A   Social History Main Topics  . Smoking status: Former Smoker -- 1.0 packs/day for 30 years    Types:  Cigarettes    Quit date: 06/05/1980  . Smokeless tobacco: Never Used  . Alcohol Use: No  . Drug Use: No  . Sexually Active: None   Other Topics Concern  . None   Social History Narrative   Lives with daughter [name] who is NP for [employer] [facility] [employer] and Vascular   Past Surgical History  Procedure Date  . Pacemaker insertion November 2007    Medtronic EnRhythm dual-chamber [id number]  . Cardiac catheterization 2005    60% stenosis of 1st diagnoal branch, 60-70% stenosis of posterolateral brach of distal RCA, preserved LV with EF 50-55%, diastolic dysfunction  . Eye surgery retinal eye bleed  . Abdominal hysterectomy    Past Medical History  Diagnosis Date  . Diabetes mellitus, insulin dependent (IDDM), controlled   . Chronic kidney disease, stage 4, severely decreased GFR     likely related to diabetic and hypertensive nephropathy, no evidence of renal artery stenosis by duplex US in 2008  . Mitral valve disorder   . Pacemaker November 2007    s/p placement complete heart block status  . Near syncope 12/12/2011  . Aortic stenosis, moderate to severe 12/12/2011  . Chronic degenerative aortic valve disease 12/12/2011  . PAF (paroxysmal atrial fibrillation) 12/12/2011  . H/O hemolytic anemia due to drugs 12/12/2011  . SSS (sick sinus syndrome) with medtronic pacemaker, placed 6 years ago, with micro dislodgement of atrial lead, though still paces--for CHB and paf 12/12/2011  . UTI (lower urinary  tract infection), curent and history of UTIs on Keflex daily 12/12/2011  . CAD (coronary artery disease), cardiac cath  12/12/2011  . Anemia, secondary to chronic disease 12/12/2011  . Pacemaker complications     Known chronic dysfunction of atrial lead with high pacing trhesholds but good sensing.  . Chronic combined systolic and diastolic CHF, NYHA class 2, in combination with AS, MR 12/15/2011  . Bleeding of eye retinal eye bleed  . Stroke    BP 145/68  Pulse 82  Resp 18  Ht 4\' 11"  (1.499 m)   Wt 140 lb 9.6 oz (63.776 kg)  BMI 28.40 kg/m2  SpO2 97%    Review of Systems  Respiratory: Positive for shortness of breath.   Musculoskeletal: Positive for gait problem.  All other systems reviewed and are negative.       Objective:   Physical Exam  Nursing note and vitals reviewed. Neurological:  Reflex Scores:      Tricep reflexes are 2+ on the right side and 2+ on the left side.      Bicep reflexes are 2+ on the right side and 2+ on the left side.      Brachioradialis reflexes are 2+ on the right side and 2+ on the left side.      Patellar reflexes are 2+ on the right side and 2+ on the left side.      Achilles reflexes are 2+ on the right side and 2+ on the left side. Psychiatric: She has a normal mood and affect. Her behavior is normal. Judgment and thought content normal.    Head: Normocephalic and atraumatic.  Eyes: Conjunctivae normal and EOM are normal.decreased peripheral vision in both eyes  Neurological: She is alert and oriented to person, place, and time. No sensory deficit. Coordination and gait normal.  . 4/5 strength in the right deltoid, biceps, triceps, grip, hip flexor, knee extensors, ankle dorsi flexion plantar flexor 5/5 strength on the left side  Psychiatric: She has a normal mood and affect.        Assessment & Plan:  1. Left subcortical infarct with right hemiparesis. Finished up home health therapy recommends outpatient therapy. Will make referral to neuro rehabilitation for PT OT  2.  Follow up with opthalmology

## 2012-05-23 ENCOUNTER — Encounter (HOSPITAL_COMMUNITY)
Admission: RE | Admit: 2012-05-23 | Discharge: 2012-05-23 | Disposition: A | Discharge status: Home / Self Care | Payer: MEDICARE | Source: Ambulatory Visit | Attending: Nephrology | Admitting: Nephrology

## 2012-05-23 LAB — RENAL FUNCTION PANEL
BUN: 41 mg/dL — ABNORMAL HIGH (ref 6–23)
Chloride: 99 mEq/L (ref 96–112)
Creatinine, Ser: 2.15 mg/dL — ABNORMAL HIGH (ref 0.50–1.10)
Glucose, Bld: 169 mg/dL — ABNORMAL HIGH (ref 70–99)
Potassium: 3.8 mEq/L (ref 3.5–5.1)

## 2012-05-23 LAB — POCT HEMOGLOBIN-HEMACUE: Hemoglobin: 12.1 g/dL (ref 12.0–15.0)

## 2012-05-23 MED ORDER — EPOETIN ALFA 20000 UNIT/ML IJ SOLN: 15000.0000 [IU] | INTRAMUSCULAR | Status: DC

## 2012-06-05 HISTORY — PX: EYE SURGERY: SHX253

## 2012-06-06 ENCOUNTER — Encounter (HOSPITAL_COMMUNITY): Payer: MEDICARE

## 2012-06-10 ENCOUNTER — Other Ambulatory Visit: Payer: Self-pay

## 2012-06-10 ENCOUNTER — Ambulatory Visit: Payer: MEDICARE | Admitting: Physical Therapy

## 2012-06-17 ENCOUNTER — Ambulatory Visit: Payer: MEDICARE | Attending: Physical Medicine & Rehabilitation | Admitting: Physical Therapy

## 2012-06-17 DIAGNOSIS — I69959 Hemiplegia and hemiparesis following unspecified cerebrovascular disease affecting unspecified side: Secondary | ICD-10-CM | POA: Insufficient documentation

## 2012-06-17 DIAGNOSIS — IMO0001 Reserved for inherently not codable concepts without codable children: Secondary | ICD-10-CM | POA: Insufficient documentation

## 2012-06-17 DIAGNOSIS — R269 Unspecified abnormalities of gait and mobility: Secondary | ICD-10-CM | POA: Insufficient documentation

## 2012-06-17 DIAGNOSIS — M6281 Muscle weakness (generalized): Secondary | ICD-10-CM | POA: Insufficient documentation

## 2012-06-20 ENCOUNTER — Encounter (HOSPITAL_COMMUNITY)
Admission: RE | Admit: 2012-06-20 | Discharge: 2012-06-20 | Disposition: A | Discharge status: Home / Self Care | Payer: MEDICARE | Source: Ambulatory Visit | Attending: Nephrology | Admitting: Nephrology

## 2012-06-20 ENCOUNTER — Ambulatory Visit: Payer: MEDICARE | Admitting: Physical Medicine & Rehabilitation

## 2012-06-20 DIAGNOSIS — D509 Iron deficiency anemia, unspecified: Secondary | ICD-10-CM | POA: Insufficient documentation

## 2012-06-20 LAB — IRON AND TIBC
Iron: 52 ug/dL (ref 42–135)
Saturation Ratios: 23 % (ref 20–55)
TIBC: 224 ug/dL — ABNORMAL LOW (ref 250–470)
UIBC: 172 ug/dL (ref 125–400)

## 2012-06-20 MED ORDER — EPOETIN ALFA 20000 UNIT/ML IJ SOLN: 15000.0000 [IU] | INTRAMUSCULAR | Status: DC

## 2012-06-24 ENCOUNTER — Ambulatory Visit: Payer: MEDICARE | Admitting: Physical Therapy

## 2012-06-26 ENCOUNTER — Ambulatory Visit: Payer: MEDICARE | Admitting: Physical Therapy

## 2012-07-01 ENCOUNTER — Ambulatory Visit: Payer: MEDICARE | Admitting: Physical Therapy

## 2012-07-02 ENCOUNTER — Ambulatory Visit (HOSPITAL_BASED_OUTPATIENT_CLINIC_OR_DEPARTMENT_OTHER): Payer: MEDICARE | Admitting: Physical Medicine & Rehabilitation

## 2012-07-02 ENCOUNTER — Encounter: Payer: MEDICARE | Attending: Physical Medicine & Rehabilitation

## 2012-07-02 ENCOUNTER — Encounter: Payer: Self-pay | Admitting: Physical Medicine & Rehabilitation

## 2012-07-02 VITALS — BP 158/82 | HR 68 | Resp 14 | Ht 59.0 in | Wt 140.0 lb

## 2012-07-02 DIAGNOSIS — E119 Type 2 diabetes mellitus without complications: Secondary | ICD-10-CM | POA: Insufficient documentation

## 2012-07-02 DIAGNOSIS — I69959 Hemiplegia and hemiparesis following unspecified cerebrovascular disease affecting unspecified side: Secondary | ICD-10-CM | POA: Insufficient documentation

## 2012-07-02 DIAGNOSIS — G8190 Hemiplegia, unspecified affecting unspecified side: Secondary | ICD-10-CM

## 2012-07-02 NOTE — Progress Notes (Signed)
Subjective:    Patient ID: [name], female    DOB: [birth year], 79 y.o.   MRN: [id number] This is a 79 year old right-handed female with diabetes mellitus with  peripheral neuropathy, PAF with pacemaker who was admitted on March 17, 2012, with right-sided weakness and dizziness. The patient used a  cane prior to admission. Lives with her daughter. Cranial CT scan  showed to be unremarkable. She did receive tPA. MRI not completed  secondary to pacemaker. Echocardiogram with normal systolic function  with ejection fraction of 55%. Carotid Dopplers demonstrated 40-59%  right ICA stenosis. EKG, sinus tachycardia. Neurology Services  consulted for suspect left brain stroke and placed on Coumadin therapy  with bridging of Lovenox.  HPI Twice a week PT at neuro rehabilitation Working on balance and walking Still using a walker in therapy. No falls at home  No other new medical problems Warfarin dose was too low Pain Inventory Average Pain n/a Pain Right Now n/a My pain is n/a  In the last 24 hours, has pain interfered with the following? General activity n/a Relation with others n/a Enjoyment of life n/a What TIME of day is your pain at its worst? n/a Sleep (in general) n/a  Pain is worse with: n/a Pain improves with: n/a Relief from Meds: n/a  Mobility Do you have any goals in this area?  no  Function Do you have any goals in this area?  no  Neuro/Psych No problems in this area  Prior Studies Any changes since last visit?  no  Physicians involved in your care Any changes since last visit?  no   History reviewed. No pertinent family history. History   Social History  . Marital Status: Widowed    Spouse Name: N/A    Number of Children: N/A  . Years of Education: N/A   Social History Main Topics  . Smoking status: Former Smoker -- 1.0 packs/day for 30 years    Types: Cigarettes    Quit date: 06/05/1980  . Smokeless tobacco: Never Used  . Alcohol Use:  No  . Drug Use: No  . Sexually Active: None   Other Topics Concern  . None   Social History Narrative   Lives with daughter [name] who is NP for [facility] and Vascular   Past Surgical History  Procedure Date  . Pacemaker insertion November 2007    Medtronic EnRhythm dual-chamber [id number]  . Cardiac catheterization 2005    60% stenosis of 1st diagnoal branch, 60-70% stenosis of posterolateral brach of distal RCA, preserved LV with EF 50-55%, diastolic dysfunction  . Eye surgery retinal eye bleed  . Abdominal hysterectomy    Past Medical History  Diagnosis Date  . Diabetes mellitus, insulin dependent (IDDM), controlled   . Chronic kidney disease, stage 4, severely decreased GFR     likely related to diabetic and hypertensive nephropathy, no evidence of renal artery stenosis by duplex US in 2008  . Mitral valve disorder   . Pacemaker November 2007    s/p placement complete heart block status  . Near syncope 12/12/2011  . Aortic stenosis, moderate to severe 12/12/2011  . Chronic degenerative aortic valve disease 12/12/2011  . PAF (paroxysmal atrial fibrillation) 12/12/2011  . H/O hemolytic anemia due to drugs 12/12/2011  . SSS (sick sinus syndrome) with medtronic pacemaker, placed 6 years ago, with micro dislodgement of atrial lead, though still paces--for CHB and paf 12/12/2011  . UTI (lower urinary tract infection), curent and history of UTIs on Keflex  daily 12/12/2011  . CAD (coronary artery disease), cardiac cath  12/12/2011  . Anemia, secondary to chronic disease 12/12/2011  . Pacemaker complications     Known chronic dysfunction of atrial lead with high pacing trhesholds but good sensing.  . Chronic combined systolic and diastolic CHF, NYHA class 2, in combination with AS, MR 12/15/2011  . Bleeding of eye retinal eye bleed  . Stroke    BP 158/82  Pulse 68  Resp 14  Ht 4\' 11"  (1.499 m)  Wt 140 lb (63.504 kg)  BMI 28.28 kg/m2  SpO2 98%     Review of Systems  All other  systems reviewed and are negative.       Objective:   Physical Exam  Neurological:  Reflex Scores:      Tricep reflexes are 2+ on the right side and 2+ on the left side.      Bicep reflexes are 2+ on the right side and 2+ on the left side.      Brachioradialis reflexes are 2+ on the right side and 2+ on the left side.      Patellar reflexes are 2+ on the right side and 2+ on the left side.      Achilles reflexes are 2+ on the right side and 2+ on the left side. Psychiatric: She has a normal mood and affect. Her behavior is normal. Judgment and thought content normal.    Head: Normocephalic and atraumatic.  Eyes: Conjunctivae normal and EOM are normal.decreased peripheral vision in both eyes  Neurological: She is alert and oriented to person, place, and time. No sensory deficit. Coordination and gait normal.  . 4/5 strength in the right deltoid, biceps, triceps, grip, hip flexor, knee extensors, ankle dorsi flexion plantar flexor 5/5 strength on the left side  Psychiatric: She has a normal mood and affect.      Assessment & Plan:  1. Left subcortical infarct with right hemiparesis. Finished up home health therapy recommends outpatient therapy. Will make referral to neuro rehabilitation for PT  We'll continue outpatient PT. Goal be to upgrade to a cane if possible.

## 2012-07-02 NOTE — Patient Instructions (Addendum)
Finished therapy in mid February I will see you in about one month This should be our last visit next month

## 2012-07-03 ENCOUNTER — Ambulatory Visit: Payer: MEDICARE | Admitting: Physical Therapy

## 2012-07-08 ENCOUNTER — Ambulatory Visit: Payer: MEDICARE | Attending: Physical Medicine & Rehabilitation | Admitting: Physical Therapy

## 2012-07-08 DIAGNOSIS — R269 Unspecified abnormalities of gait and mobility: Secondary | ICD-10-CM | POA: Insufficient documentation

## 2012-07-08 DIAGNOSIS — IMO0001 Reserved for inherently not codable concepts without codable children: Secondary | ICD-10-CM | POA: Insufficient documentation

## 2012-07-08 DIAGNOSIS — I69959 Hemiplegia and hemiparesis following unspecified cerebrovascular disease affecting unspecified side: Secondary | ICD-10-CM | POA: Insufficient documentation

## 2012-07-08 DIAGNOSIS — M6281 Muscle weakness (generalized): Secondary | ICD-10-CM | POA: Insufficient documentation

## 2012-07-10 ENCOUNTER — Ambulatory Visit: Payer: MEDICARE | Admitting: Physical Therapy

## 2012-07-16 ENCOUNTER — Ambulatory Visit: Payer: MEDICARE | Admitting: Physical Therapy

## 2012-07-17 ENCOUNTER — Other Ambulatory Visit (HOSPITAL_COMMUNITY): Payer: Self-pay | Admitting: *Deleted

## 2012-07-18 ENCOUNTER — Inpatient Hospital Stay (HOSPITAL_COMMUNITY): Admission: RE | Admit: 2012-07-18 | Payer: MEDICARE | Source: Ambulatory Visit

## 2012-07-18 ENCOUNTER — Ambulatory Visit: Payer: MEDICARE | Admitting: Physical Therapy

## 2012-07-22 ENCOUNTER — Ambulatory Visit: Payer: MEDICARE | Admitting: Physical Therapy

## 2012-07-25 ENCOUNTER — Ambulatory Visit: Payer: MEDICARE | Admitting: Physical Therapy

## 2012-07-25 ENCOUNTER — Encounter (HOSPITAL_COMMUNITY)
Admission: RE | Admit: 2012-07-25 | Discharge: 2012-07-25 | Disposition: A | Discharge status: Home / Self Care | Payer: MEDICARE | Source: Ambulatory Visit | Attending: Nephrology | Admitting: Nephrology

## 2012-07-25 DIAGNOSIS — D509 Iron deficiency anemia, unspecified: Secondary | ICD-10-CM | POA: Insufficient documentation

## 2012-07-25 MED ORDER — EPOETIN ALFA 20000 UNIT/ML IJ SOLN: 15000.0000 [IU] | INTRAMUSCULAR | Status: DC

## 2012-07-26 ENCOUNTER — Encounter (HOSPITAL_COMMUNITY): Payer: MEDICARE

## 2012-07-30 ENCOUNTER — Encounter: Payer: MEDICARE | Attending: Physical Medicine & Rehabilitation

## 2012-07-30 ENCOUNTER — Encounter: Payer: Self-pay | Admitting: Physical Medicine & Rehabilitation

## 2012-07-30 ENCOUNTER — Ambulatory Visit: Payer: MEDICARE | Admitting: Physical Therapy

## 2012-07-30 ENCOUNTER — Ambulatory Visit (HOSPITAL_BASED_OUTPATIENT_CLINIC_OR_DEPARTMENT_OTHER): Payer: MEDICARE | Admitting: Physical Medicine & Rehabilitation

## 2012-07-30 VITALS — BP 162/85 | HR 72 | Resp 14 | Ht 59.0 in | Wt 140.0 lb

## 2012-07-30 DIAGNOSIS — I69991 Dysphagia following unspecified cerebrovascular disease: Secondary | ICD-10-CM

## 2012-07-30 DIAGNOSIS — I69959 Hemiplegia and hemiparesis following unspecified cerebrovascular disease affecting unspecified side: Secondary | ICD-10-CM | POA: Insufficient documentation

## 2012-07-30 DIAGNOSIS — E119 Type 2 diabetes mellitus without complications: Secondary | ICD-10-CM | POA: Insufficient documentation

## 2012-07-30 NOTE — Patient Instructions (Addendum)
If you develop shortness of breath without exertion please contact either your cardiologist or your primary care physician

## 2012-07-30 NOTE — Progress Notes (Signed)
Subjective:    Patient ID: [name], female    DOB: 05-23-1925, 79 y.o.   MRN: [id number]  HPI 79 year old right-handed female with diabetes mellitus with  peripheral neuropathy, PAF with pacemaker who was admitted on March 17, 2012, with right-sided weakness and dizziness. The patient used a  cane prior to admission. Lives with her daughter. Cranial CT scan  showed to be unremarkable. She did receive tPA. MRI not completed  secondary to pacemaker. Echocardiogram with normal systolic function  with ejection fraction of 55%. Carotid Dopplers demonstrated 40-59%  right ICA stenosis. EKG, sinus tachycardia  Short walks at home furniture walking otherwise using walker outside the home , cane in PT Twice a week PT, finishing March 12th Compliant with HEP every other day or so Pain Inventory Average Pain 0 Pain Right Now 0 My pain is no pain  In the last 24 hours, has pain interfered with the following? General activity 0 Relation with others 0 Enjoyment of life 0 What TIME of day is your pain at its worst? no pain Sleep (in general) Good  Pain is worse with: no pain Pain improves with: no pain Relief from Meds: doesnt take pain medication  Mobility walk with assistance use a walker ability to climb steps?  yes do you drive?  no  Function retired I need assistance with the following:  shopping  Neuro/Psych No problems in this area  Prior Studies Any changes since last visit?  no  Physicians involved in your care Any changes since last visit?  no   History reviewed. No pertinent family history. History   Social History  . Marital Status: Widowed    Spouse Name: N/A    Number of Children: N/A  . Years of Education: N/A   Social History Main Topics  . Smoking status: Former Smoker -- 1.00 packs/day for 30 years    Types: Cigarettes    Quit date: 06/05/1980  . Smokeless tobacco: Never Used  . Alcohol Use: No  . Drug Use: No  . Sexually Active: None    Other Topics Concern  . None   Social History Narrative   Lives with daughter Ajia Chadderdon who is NP for [facility]   Past Surgical History  Procedure Laterality Date  . Pacemaker insertion  November 2007    Medtronic EnRhythm dual-chamber [id number]  . Cardiac catheterization  2005    60% stenosis of 1st diagnoal branch, 60-70% stenosis of posterolateral brach of distal RCA, preserved LV with EF 50-55%, diastolic dysfunction  . Eye surgery  retinal eye bleed  . Abdominal hysterectomy     Past Medical History  Diagnosis Date  . Diabetes mellitus, insulin dependent (IDDM), controlled   . Chronic kidney disease, stage 4, severely decreased GFR     likely related to diabetic and hypertensive nephropathy, no evidence of renal artery stenosis by duplex US in 2008  . Mitral valve disorder   . Pacemaker November 2007    s/p placement complete heart block status  . Near syncope 12/12/2011  . Aortic stenosis, moderate to severe 12/12/2011  . Chronic degenerative aortic valve disease 12/12/2011  . PAF (paroxysmal atrial fibrillation) 12/12/2011  . H/O hemolytic anemia due to drugs 12/12/2011  . SSS (sick sinus syndrome) with medtronic pacemaker, placed 6 years ago, with micro dislodgement of atrial lead, though still paces--for CHB and paf 12/12/2011  . UTI (lower urinary tract infection), curent and history of UTIs on Keflex daily 12/12/2011  . CAD (  coronary artery disease), cardiac cath  12/12/2011  . Anemia, secondary to chronic disease 12/12/2011  . Pacemaker complications     Known chronic dysfunction of atrial lead with high pacing trhesholds but good sensing.  . Chronic combined systolic and diastolic CHF, NYHA class 2, in combination with AS, MR 12/15/2011  . Bleeding of eye retinal eye bleed  . Stroke    BP 162/85  Pulse 72  Resp 14  Ht 4\' 11"  (1.499 m)  Wt 140 lb (63.504 kg)  BMI 28.26 kg/m2  SpO2 92%   Review of Systems  Respiratory: Positive for shortness of  breath.   All other systems reviewed and are negative.       Objective:   Physical Exam  Cardiovascular: Normal rate, regular rhythm and normal heart sounds.   Pulmonary/Chest: Effort normal and breath sounds normal.  Decreased breath sounds at the bases  Motor strength is 5/5 in bilateral deltoid, biceps, triceps, grip, hip flexor, knee extensor and ankle dorsiflexor. Sensation is intact to light touch in both upper and lower extremities General no distress Extremities without edema Weight is stable at 140 pounds compared to last visit one month ago        Assessment & Plan:  1. Left subcortical infarct with right hemiparesis. Finish outpatient therapy.PT in 2wks RTC prn We'll continue outpatient PT. Goal be to upgrade to a cane if possible 2 dyspnea on exertion, no obvious signs of heart failure or respiratory infection. If this progresses would recommend followup with cardiology or internal medicine

## 2012-08-02 ENCOUNTER — Ambulatory Visit: Payer: MEDICARE | Admitting: Physical Therapy

## 2012-08-05 ENCOUNTER — Ambulatory Visit: Payer: MEDICARE | Admitting: Physical Therapy

## 2012-08-07 ENCOUNTER — Ambulatory Visit: Payer: MEDICARE | Attending: Physical Medicine & Rehabilitation | Admitting: Physical Therapy

## 2012-08-07 DIAGNOSIS — I69959 Hemiplegia and hemiparesis following unspecified cerebrovascular disease affecting unspecified side: Secondary | ICD-10-CM | POA: Insufficient documentation

## 2012-08-07 DIAGNOSIS — IMO0001 Reserved for inherently not codable concepts without codable children: Secondary | ICD-10-CM | POA: Insufficient documentation

## 2012-08-07 DIAGNOSIS — R269 Unspecified abnormalities of gait and mobility: Secondary | ICD-10-CM | POA: Insufficient documentation

## 2012-08-07 DIAGNOSIS — M6281 Muscle weakness (generalized): Secondary | ICD-10-CM | POA: Insufficient documentation

## 2012-08-12 ENCOUNTER — Ambulatory Visit: Payer: MEDICARE | Admitting: Physical Therapy

## 2012-08-14 ENCOUNTER — Ambulatory Visit: Payer: MEDICARE | Admitting: Physical Therapy

## 2012-08-20 ENCOUNTER — Ambulatory Visit: Payer: Self-pay | Admitting: Cardiovascular Disease

## 2012-08-20 DIAGNOSIS — Z7901 Long term (current) use of anticoagulants: Secondary | ICD-10-CM | POA: Insufficient documentation

## 2012-08-20 DIAGNOSIS — I480 Paroxysmal atrial fibrillation: Secondary | ICD-10-CM

## 2012-08-28 ENCOUNTER — Other Ambulatory Visit (HOSPITAL_COMMUNITY): Payer: Self-pay | Admitting: *Deleted

## 2012-08-29 ENCOUNTER — Encounter (HOSPITAL_COMMUNITY)
Admission: RE | Admit: 2012-08-29 | Discharge: 2012-08-29 | Disposition: A | Discharge status: Home / Self Care | Payer: MEDICARE | Source: Ambulatory Visit | Attending: Nephrology | Admitting: Nephrology

## 2012-08-29 DIAGNOSIS — D509 Iron deficiency anemia, unspecified: Secondary | ICD-10-CM | POA: Insufficient documentation

## 2012-08-29 LAB — POCT HEMOGLOBIN-HEMACUE: Hemoglobin: 13.5 g/dL (ref 12.0–15.0)

## 2012-08-29 LAB — IRON AND TIBC
Iron: 63 ug/dL (ref 42–135)
Saturation Ratios: 24 % (ref 20–55)
TIBC: 258 ug/dL (ref 250–470)
UIBC: 195 ug/dL (ref 125–400)

## 2012-08-29 MED ORDER — EPOETIN ALFA 20000 UNIT/ML IJ SOLN: 15000.0000 [IU] | INTRAMUSCULAR | Status: DC

## 2012-08-30 ENCOUNTER — Ambulatory Visit: Payer: MEDICARE | Admitting: Physical Therapy

## 2012-09-26 ENCOUNTER — Encounter (HOSPITAL_COMMUNITY)
Admission: RE | Admit: 2012-09-26 | Discharge: 2012-09-26 | Disposition: A | Discharge status: Home / Self Care | Payer: MEDICARE | Source: Ambulatory Visit | Attending: Nephrology | Admitting: Nephrology

## 2012-09-26 DIAGNOSIS — D509 Iron deficiency anemia, unspecified: Secondary | ICD-10-CM | POA: Insufficient documentation

## 2012-09-26 LAB — IRON AND TIBC
Iron: 74 ug/dL (ref 42–135)
Saturation Ratios: 29 % (ref 20–55)
TIBC: 251 ug/dL (ref 250–470)
UIBC: 177 ug/dL (ref 125–400)

## 2012-09-26 MED ORDER — EPOETIN ALFA 20000 UNIT/ML IJ SOLN: 15000.0000 [IU] | INTRAMUSCULAR | Status: DC

## 2012-10-24 ENCOUNTER — Encounter (HOSPITAL_COMMUNITY)
Admission: RE | Admit: 2012-10-24 | Discharge: 2012-10-24 | Disposition: A | Discharge status: Home / Self Care | Payer: MEDICARE | Source: Ambulatory Visit | Attending: Nephrology | Admitting: Nephrology

## 2012-10-24 DIAGNOSIS — D509 Iron deficiency anemia, unspecified: Secondary | ICD-10-CM | POA: Insufficient documentation

## 2012-10-24 LAB — POCT HEMOGLOBIN-HEMACUE: Hemoglobin: 13.3 g/dL (ref 12.0–15.0)

## 2012-10-24 MED ORDER — EPOETIN ALFA 20000 UNIT/ML IJ SOLN: 15000.0000 [IU] | INTRAMUSCULAR | Status: DC

## 2012-10-25 LAB — IRON AND TIBC
Iron: 76 ug/dL (ref 42–135)
Saturation Ratios: 30 % (ref 20–55)
TIBC: 254 ug/dL (ref 250–470)
UIBC: 178 ug/dL (ref 125–400)

## 2012-11-08 ENCOUNTER — Ambulatory Visit (INDEPENDENT_AMBULATORY_CARE_PROVIDER_SITE_OTHER): Payer: MEDICARE | Admitting: Pharmacist Clinician (PhC)/ Clinical Pharmacy Specialist

## 2012-11-08 VITALS — BP 132/82 | HR 68

## 2012-11-08 DIAGNOSIS — I4891 Unspecified atrial fibrillation: Secondary | ICD-10-CM

## 2012-11-08 DIAGNOSIS — Z7901 Long term (current) use of anticoagulants: Secondary | ICD-10-CM

## 2012-11-08 DIAGNOSIS — I480 Paroxysmal atrial fibrillation: Secondary | ICD-10-CM

## 2012-11-08 LAB — POCT INR: INR: 2.2

## 2012-11-21 ENCOUNTER — Encounter (HOSPITAL_COMMUNITY): Payer: MEDICARE

## 2012-11-29 ENCOUNTER — Other Ambulatory Visit (HOSPITAL_COMMUNITY): Payer: Self-pay | Admitting: *Deleted

## 2012-12-02 ENCOUNTER — Encounter (HOSPITAL_COMMUNITY)
Admission: RE | Admit: 2012-12-02 | Discharge: 2012-12-02 | Disposition: A | Discharge status: Home / Self Care | Payer: MEDICARE | Source: Ambulatory Visit | Attending: Nephrology | Admitting: Nephrology

## 2012-12-02 DIAGNOSIS — D509 Iron deficiency anemia, unspecified: Secondary | ICD-10-CM | POA: Insufficient documentation

## 2012-12-02 MED ORDER — EPOETIN ALFA 20000 UNIT/ML IJ SOLN: 15000.0000 [IU] | INTRAMUSCULAR | Status: DC

## 2012-12-03 ENCOUNTER — Other Ambulatory Visit: Payer: Self-pay | Admitting: *Deleted

## 2012-12-03 MED ORDER — ZOLPIDEM TARTRATE 5 MG PO TABS: 5.0000 mg | ORAL_TABLET | Freq: Every day | ORAL | Status: DC

## 2012-12-03 NOTE — Telephone Encounter (Signed)
Call in zolpidem to [facility] with 3 refills.

## 2012-12-09 ENCOUNTER — Ambulatory Visit (INDEPENDENT_AMBULATORY_CARE_PROVIDER_SITE_OTHER): Payer: MEDICARE | Admitting: Pharmacist Clinician (PhC)/ Clinical Pharmacy Specialist

## 2012-12-09 VITALS — BP 156/72 | HR 68

## 2012-12-09 DIAGNOSIS — Z7901 Long term (current) use of anticoagulants: Secondary | ICD-10-CM

## 2012-12-09 DIAGNOSIS — I4891 Unspecified atrial fibrillation: Secondary | ICD-10-CM

## 2012-12-09 DIAGNOSIS — I480 Paroxysmal atrial fibrillation: Secondary | ICD-10-CM

## 2012-12-14 ENCOUNTER — Emergency Department (HOSPITAL_COMMUNITY): Payer: MEDICARE

## 2012-12-14 ENCOUNTER — Emergency Department (HOSPITAL_COMMUNITY)
Admission: EM | Admit: 2012-12-14 | Discharge: 2012-12-14 | Disposition: A | Discharge status: Home / Self Care | Payer: MEDICARE | Attending: Emergency Medicine | Admitting: Emergency Medicine

## 2012-12-14 ENCOUNTER — Encounter (HOSPITAL_COMMUNITY): Payer: Self-pay | Admitting: Cardiology

## 2012-12-14 DIAGNOSIS — IMO0002 Reserved for concepts with insufficient information to code with codable children: Secondary | ICD-10-CM | POA: Insufficient documentation

## 2012-12-14 DIAGNOSIS — Y92009 Unspecified place in unspecified non-institutional (private) residence as the place of occurrence of the external cause: Secondary | ICD-10-CM | POA: Insufficient documentation

## 2012-12-14 DIAGNOSIS — Z79899 Other long term (current) drug therapy: Secondary | ICD-10-CM | POA: Insufficient documentation

## 2012-12-14 DIAGNOSIS — Z792 Long term (current) use of antibiotics: Secondary | ICD-10-CM | POA: Insufficient documentation

## 2012-12-14 DIAGNOSIS — Z950 Presence of cardiac pacemaker: Secondary | ICD-10-CM | POA: Insufficient documentation

## 2012-12-14 DIAGNOSIS — S0083XA Contusion of other part of head, initial encounter: Secondary | ICD-10-CM | POA: Insufficient documentation

## 2012-12-14 DIAGNOSIS — I4891 Unspecified atrial fibrillation: Secondary | ICD-10-CM | POA: Insufficient documentation

## 2012-12-14 DIAGNOSIS — Z880 Allergy status to penicillin: Secondary | ICD-10-CM | POA: Insufficient documentation

## 2012-12-14 DIAGNOSIS — S0990XA Unspecified injury of head, initial encounter: Secondary | ICD-10-CM

## 2012-12-14 DIAGNOSIS — I359 Nonrheumatic aortic valve disorder, unspecified: Secondary | ICD-10-CM | POA: Insufficient documentation

## 2012-12-14 DIAGNOSIS — N390 Urinary tract infection, site not specified: Secondary | ICD-10-CM | POA: Insufficient documentation

## 2012-12-14 DIAGNOSIS — I5042 Chronic combined systolic (congestive) and diastolic (congestive) heart failure: Secondary | ICD-10-CM | POA: Insufficient documentation

## 2012-12-14 DIAGNOSIS — Z7901 Long term (current) use of anticoagulants: Secondary | ICD-10-CM | POA: Insufficient documentation

## 2012-12-14 DIAGNOSIS — Z23 Encounter for immunization: Secondary | ICD-10-CM | POA: Insufficient documentation

## 2012-12-14 DIAGNOSIS — Z87891 Personal history of nicotine dependence: Secondary | ICD-10-CM | POA: Insufficient documentation

## 2012-12-14 DIAGNOSIS — Z794 Long term (current) use of insulin: Secondary | ICD-10-CM | POA: Insufficient documentation

## 2012-12-14 DIAGNOSIS — Y9389 Activity, other specified: Secondary | ICD-10-CM | POA: Insufficient documentation

## 2012-12-14 DIAGNOSIS — Z8673 Personal history of transient ischemic attack (TIA), and cerebral infarction without residual deficits: Secondary | ICD-10-CM | POA: Insufficient documentation

## 2012-12-14 DIAGNOSIS — I2510 Atherosclerotic heart disease of native coronary artery without angina pectoris: Secondary | ICD-10-CM | POA: Insufficient documentation

## 2012-12-14 DIAGNOSIS — I059 Rheumatic mitral valve disease, unspecified: Secondary | ICD-10-CM | POA: Insufficient documentation

## 2012-12-14 DIAGNOSIS — S0003XA Contusion of scalp, initial encounter: Secondary | ICD-10-CM | POA: Insufficient documentation

## 2012-12-14 DIAGNOSIS — W1809XA Striking against other object with subsequent fall, initial encounter: Secondary | ICD-10-CM | POA: Insufficient documentation

## 2012-12-14 DIAGNOSIS — E109 Type 1 diabetes mellitus without complications: Secondary | ICD-10-CM | POA: Insufficient documentation

## 2012-12-14 MED ORDER — TETANUS-DIPHTH-ACELL PERTUSSIS 5-2.5-18.5 LF-MCG/0.5 IM SUSP
0.5000 mL | Freq: Once | INTRAMUSCULAR | Status: AC
  Administered 2012-12-14: 0.5 mL via INTRAMUSCULAR
  Filled 2012-12-14: qty 0.5

## 2012-12-14 NOTE — ED Notes (Signed)
Pt returned from CT. Back on the monitor.

## 2012-12-14 NOTE — ED Provider Notes (Signed)
History    CSN: [id number] Arrival date & time 12/14/12  0736  First MD Initiated Contact with Patient 12/14/12 0740     Chief Complaint  Patient presents with  . Fall   (Consider location/radiation/quality/duration/timing/severity/associated sxs/prior Treatment) HPI Comments: Patient presents after sustaining a fall. She states that she was getting out of bed and was still little sleepy initially was walking to her bedside commode she fell and hit her head on the nightstand. She denies any preceding symptoms such as dizziness chest pain or shortness of breath. She has some tenderness to the back of her head and she is on Coumadin. She denies any other injuries from the fall. She has a mild headache but denies any nausea vomiting. She denies any numbness or weakness in her extremities.  Patient is a 79 y.o. female presenting with fall.  Fall Associated symptoms include headaches. Pertinent negatives include no chest pain, no abdominal pain and no shortness of breath.   Past Medical History  Diagnosis Date  . Diabetes mellitus, insulin dependent (IDDM), controlled   . Chronic kidney disease, stage 4, severely decreased GFR     likely related to diabetic and hypertensive nephropathy, no evidence of renal artery stenosis by duplex US in 2008  . Mitral valve disorder   . Pacemaker November 2007    s/p placement complete heart block status  . Near syncope 12/12/2011  . Aortic stenosis, moderate to severe 12/12/2011  . Chronic degenerative aortic valve disease 12/12/2011  . PAF (paroxysmal atrial fibrillation) 12/12/2011  . H/O hemolytic anemia due to drugs 12/12/2011  . SSS (sick sinus syndrome) with medtronic pacemaker, placed 6 years ago, with micro dislodgement of atrial lead, though still paces--for CHB and paf 12/12/2011  . UTI (lower urinary tract infection), curent and history of UTIs on Keflex daily 12/12/2011  . CAD (coronary artery disease), cardiac cath  12/12/2011  . Anemia, secondary to  chronic disease 12/12/2011  . Pacemaker complications     Known chronic dysfunction of atrial lead with high pacing trhesholds but good sensing.  . Chronic combined systolic and diastolic CHF, NYHA class 2, in combination with AS, MR 12/15/2011  . Bleeding of eye retinal eye bleed  . Stroke    Past Surgical History  Procedure Laterality Date  . Pacemaker insertion  November 2007    Medtronic EnRhythm dual-chamber [id number]  . Cardiac catheterization  2005    60% stenosis of 1st diagnoal branch, 60-70% stenosis of posterolateral brach of distal RCA, preserved LV with EF 50-55%, diastolic dysfunction  . Eye surgery  retinal eye bleed  . Abdominal hysterectomy     History reviewed. No pertinent family history. History  Substance Use Topics  . Smoking status: Former Smoker -- 1.00 packs/day for 30 years    Types: Cigarettes    Quit date: 06/05/1980  . Smokeless tobacco: Never Used  . Alcohol Use: No   OB History   Grav Para Term Preterm Abortions TAB SAB Ect Mult Living                 Review of Systems  Constitutional: Negative for fever, chills, diaphoresis and fatigue.  HENT: Negative for congestion, rhinorrhea and sneezing.   Eyes: Negative.   Respiratory: Negative for cough, chest tightness and shortness of breath.   Cardiovascular: Negative for chest pain and leg swelling.  Gastrointestinal: Negative for nausea, vomiting, abdominal pain, diarrhea and blood in stool.  Genitourinary: Negative for frequency, hematuria, flank pain and difficulty urinating.  Musculoskeletal: Negative for back pain and arthralgias.  Skin: Negative for rash.  Neurological: Positive for headaches. Negative for dizziness, speech difficulty, weakness and numbness.    Allergies  Floxin; Sulfa antibiotics; Darvocet; and Penicillins  Home Medications   Current Outpatient Rx  Name  Route  Sig  Dispense  Refill  . acetaminophen (TYLENOL) 500 MG tablet   Oral   Take 500-1,000 mg by mouth 2 (two)  times daily as needed for pain.         Marland Kitchen amiodarone (PACERONE) 200 MG tablet   Oral   Take 200 mg by mouth daily.          . butalbital-acetaminophen-caffeine (FIORICET, ESGIC) 50-325-40 MG per tablet   Oral   Take 1 tablet by mouth daily as needed for headache.          . calcitRIOL (ROCALTROL) 0.25 MCG capsule   Oral   Take 0.25 mcg by mouth daily.          . cephALEXin (KEFLEX) 250 MG capsule   Oral   Take 250 mg by mouth daily.         . diphenhydrAMINE (BENADRYL) 25 MG tablet   Oral   Take 50 mg by mouth 3 (three) times daily as needed for itching.          . ezetimibe-simvastatin (VYTORIN) 10-20 MG per tablet   Oral   Take 1 tablet by mouth daily.         . furosemide (LASIX) 40 MG tablet   Oral   Take 40-80 mg by mouth. Alternate, taking 40 mg (one tablet) every other day and 80 mg (two tablets) the other days.         . insulin glargine (LANTUS) 100 UNIT/ML injection   Subcutaneous   Inject 17 Units into the skin every morning.         Marland Kitchen NOVOLOG FLEXPEN 100 UNIT/ML injection   Subcutaneous   Inject 0-10 Units into the skin 3 (three) times daily before meals. Sliding scale         . potassium chloride (K-DUR) 10 MEQ tablet   Oral   Take 10-20 mEq by mouth daily. Alternate, taking 10 mEq (one tablet) every other day and 20 mEq (two tablets) the other days.         . Probiotic Product (ALIGN) 4 MG CAPS   Oral   Take 1 capsule by mouth daily.         . sennosides-docusate sodium (SENOKOT-S) 8.6-50 MG tablet   Oral   Take 1 tablet by mouth daily.         Marland Kitchen warfarin (COUMADIN) 5 MG tablet   Oral   Take 5-7.5 mg by mouth daily. Take 7.5 mg (one and a half tablets) on Mondays, Wednesdays, and Fridays. On other days, take 5 mg (one tablet).         . zolpidem (AMBIEN) 5 MG tablet   Oral   Take 1 tablet (5 mg total) by mouth at bedtime. Patient takes another one-half tablet in the middle of night if she wakes up.   30 tablet   3     BP 154/73  Pulse 63  Temp(Src) 98.2 F (36.8 C) (Oral)  Resp 14  SpO2 97% Physical Exam  Constitutional: She is oriented to person, place, and time. She appears well-developed and well-nourished.  HENT:  Head: Normocephalic.  Mouth/Throat: Oropharynx is clear and moist.  Small hematoma to the posterior scalp  Eyes: Pupils are equal, round, and reactive to light.  Neck: Normal range of motion. Neck supple.  No tenderness on palpation of the cervical thoracic or lumbosacral spine  Cardiovascular: Normal rate, regular rhythm and normal heart sounds.   Pulmonary/Chest: Effort normal and breath sounds normal. No respiratory distress. She has no wheezes. She has no rales. She exhibits no tenderness.  Abdominal: Soft. Bowel sounds are normal. There is no tenderness. There is no rebound and no guarding.  Musculoskeletal: Normal range of motion. She exhibits no edema.  No pain on palpation or range of motion extremities  Lymphadenopathy:    She has no cervical adenopathy.  Neurological: She is alert and oriented to person, place, and time. She has normal strength. No cranial nerve deficit or sensory deficit. GCS eye subscore is 4. GCS verbal subscore is 5. GCS motor subscore is 6.  Skin: Skin is warm and dry. No rash noted.  Small abrasion to the right lower leg  Psychiatric: She has a normal mood and affect.    ED Course  Procedures (including critical care time) Results for orders placed during the hospital encounter of 12/14/12  PROTIME-INR      Result Value Range   Prothrombin Time 17.7 (*) 11.6 - 15.2 seconds   INR 1.50 (*) 0.00 - 1.49   Ct Head Wo Contrast  12/14/2012   *RADIOLOGY REPORT*  Clinical Data:  Fall.  The patient hit her head on the night stand. Laceration.  The patient is on Coumadin.  She was dizzy upon awakening.  CT HEAD WITHOUT CONTRAST CT CERVICAL SPINE WITHOUT CONTRAST  Technique:  Multidetector CT imaging of the head and cervical spine was performed following  the standard protocol without intravenous contrast.  Multiplanar CT image reconstructions of the cervical spine were also generated.  Comparison:  CT head without contrast 03/18/2012.  MRI cervical spine 09/30/2005.  CT HEAD  Findings: Moderate generalized atrophy and diffuse white matter disease is evident.  No acute cortical infarct, hemorrhage, or mass lesion is present.  Minimal soft tissue swelling is present in the occipital scalp without underlying fracture.  Remote lacunar infarcts are present within the right basal ganglia.  The paranasal sinuses and mastoid air cells are clear. Atherosclerotic calcifications present within the cavernous carotid arteries and a left vertebral artery.  IMPRESSION:  1.  Small occipital scalp soft tissue swelling/hematoma without an underlying fracture. 2.  Stable atrophy and advanced white matter disease.  This likely reflects the sequelae of chronic microvascular ischemia. 3.  Remote lacunar infarcts of the right basal ganglia. 4.  No acute intracranial abnormality. 5.  Atherosclerosis.  CT CERVICAL SPINE  Findings: The cervical spine is imaged from skull base through T1- 2.  Degenerative anterolisthesis at to C2-3 and C4-5 is stable. There is slight increase in degenerative anterolisthesis at C3-4. Chronic loss of disc height is again noted at C5-6.  Multilevel facet degenerative changes are present bilaterally.  Dense atherosclerotic calcifications are present within the carotid bifurcations bilaterally.  There is medial deviation of the common carotid arteries.  No acute soft tissue injury is evident.  The lung apices are clear.  IMPRESSION:  1.  Moderate spondylosis of the cervical spine with progressive degenerative anterolisthesis at C3-4. 2.  No acute fracture or traumatic subluxation.   Original Report Authenticated By: Marin Roberts, M.D.   Ct Cervical Spine Wo Contrast  12/14/2012   *RADIOLOGY REPORT*  Clinical Data:  Fall.  The patient hit her head on the  night  stand. Laceration.  The patient is on Coumadin.  She was dizzy upon awakening.  CT HEAD WITHOUT CONTRAST CT CERVICAL SPINE WITHOUT CONTRAST  Technique:  Multidetector CT imaging of the head and cervical spine was performed following the standard protocol without intravenous contrast.  Multiplanar CT image reconstructions of the cervical spine were also generated.  Comparison:  CT head without contrast 03/18/2012.  MRI cervical spine 09/30/2005.  CT HEAD  Findings: Moderate generalized atrophy and diffuse white matter disease is evident.  No acute cortical infarct, hemorrhage, or mass lesion is present.  Minimal soft tissue swelling is present in the occipital scalp without underlying fracture.  Remote lacunar infarcts are present within the right basal ganglia.  The paranasal sinuses and mastoid air cells are clear. Atherosclerotic calcifications present within the cavernous carotid arteries and a left vertebral artery.  IMPRESSION:  1.  Small occipital scalp soft tissue swelling/hematoma without an underlying fracture. 2.  Stable atrophy and advanced white matter disease.  This likely reflects the sequelae of chronic microvascular ischemia. 3.  Remote lacunar infarcts of the right basal ganglia. 4.  No acute intracranial abnormality. 5.  Atherosclerosis.  CT CERVICAL SPINE  Findings: The cervical spine is imaged from skull base through T1- 2.  Degenerative anterolisthesis at to C2-3 and C4-5 is stable. There is slight increase in degenerative anterolisthesis at C3-4. Chronic loss of disc height is again noted at C5-6.  Multilevel facet degenerative changes are present bilaterally.  Dense atherosclerotic calcifications are present within the carotid bifurcations bilaterally.  There is medial deviation of the common carotid arteries.  No acute soft tissue injury is evident.  The lung apices are clear.  IMPRESSION:  1.  Moderate spondylosis of the cervical spine with progressive degenerative anterolisthesis at  C3-4. 2.  No acute fracture or traumatic subluxation.   Original Report Authenticated By: Marin Roberts, M.D.    Date: 12/14/2012  Rate: 62  Rhythm: atrial paced rhythm  QRS Axis: left  Intervals: normal  ST/T Wave abnormalities: nonspecific ST/T changes  Conduction Disutrbances:left bundle branch block  Narrative Interpretation:   Old EKG Reviewed: none available     1. Head injury, acute, initial encounter     MDM  Patient is well-appearing with no signs of intracranial hemorrhage after sustaining a mechanical fall. There is no lacerations over the wound. She is alert and oriented and has no neurologic deficits. Her Coumadin is subtherapeutic. She was advised to followup with her physician on Monday regarding her Coumadin management. She was advised that we best to stay with someone for the next 24 hours to observe for any signs of worsening head injury. She was advised to return here as needed for worsening symptoms. Her tetanus shot is updated    Rolan Bucco, MD 12/14/12 [phone]

## 2012-12-14 NOTE — ED Notes (Addendum)
Pt reports that she got up this morning and fell from her bed. Pt reports that she hit her head on the night stand. Laceration noted to the back of her head. Pt reports that she lost her balance. Pt A&Ox4. No distress noted. Bleeding controlled. Pt reports that she is on coumadin.

## 2012-12-14 NOTE — ED Notes (Signed)
[phone]

## 2012-12-28 ENCOUNTER — Other Ambulatory Visit: Payer: Self-pay | Admitting: Cardiovascular Disease

## 2012-12-30 ENCOUNTER — Encounter (HOSPITAL_COMMUNITY)
Admission: RE | Admit: 2012-12-30 | Discharge: 2012-12-30 | Disposition: A | Discharge status: Home / Self Care | Payer: MEDICARE | Source: Ambulatory Visit | Attending: Nephrology | Admitting: Nephrology

## 2012-12-30 DIAGNOSIS — D509 Iron deficiency anemia, unspecified: Secondary | ICD-10-CM | POA: Insufficient documentation

## 2012-12-30 LAB — IRON AND TIBC
Iron: 67 ug/dL (ref 42–135)
Saturation Ratios: 27 % (ref 20–55)
TIBC: 251 ug/dL (ref 250–470)
UIBC: 184 ug/dL (ref 125–400)

## 2012-12-30 MED ORDER — EPOETIN ALFA 20000 UNIT/ML IJ SOLN: 15000.0000 [IU] | INTRAMUSCULAR | Status: DC

## 2012-12-30 NOTE — Telephone Encounter (Signed)
Rx was printed for [facility] to sign and will be faxed to pharmacy once signature is received.

## 2012-12-31 ENCOUNTER — Ambulatory Visit (INDEPENDENT_AMBULATORY_CARE_PROVIDER_SITE_OTHER): Payer: Self-pay | Admitting: Pharmacist

## 2012-12-31 DIAGNOSIS — Z7901 Long term (current) use of anticoagulants: Secondary | ICD-10-CM

## 2012-12-31 DIAGNOSIS — I4891 Unspecified atrial fibrillation: Secondary | ICD-10-CM

## 2012-12-31 DIAGNOSIS — I480 Paroxysmal atrial fibrillation: Secondary | ICD-10-CM

## 2013-01-12 ENCOUNTER — Other Ambulatory Visit: Payer: Self-pay | Admitting: Cardiovascular Disease

## 2013-01-12 LAB — PACEMAKER DEVICE OBSERVATION

## 2013-01-13 ENCOUNTER — Ambulatory Visit (INDEPENDENT_AMBULATORY_CARE_PROVIDER_SITE_OTHER): Payer: MEDICARE | Admitting: Pharmacist Clinician (PhC)/ Clinical Pharmacy Specialist

## 2013-01-13 DIAGNOSIS — I480 Paroxysmal atrial fibrillation: Secondary | ICD-10-CM

## 2013-01-13 DIAGNOSIS — I4891 Unspecified atrial fibrillation: Secondary | ICD-10-CM

## 2013-01-13 DIAGNOSIS — Z7901 Long term (current) use of anticoagulants: Secondary | ICD-10-CM

## 2013-01-13 LAB — REMOTE PACEMAKER DEVICE
AL IMPEDENCE PM: 473 Ohm
ATRIAL PACING PM: 73.5
BAMS-0001: 150 {beats}/min
BATTERY VOLTAGE: 2.78 V
BRDY-0003RV: 120 {beats}/min
BRDY-0004RV: 120 {beats}/min

## 2013-01-15 ENCOUNTER — Encounter: Payer: Self-pay | Admitting: Cardiovascular Disease

## 2013-01-27 ENCOUNTER — Encounter (HOSPITAL_COMMUNITY)
Admission: RE | Admit: 2013-01-27 | Discharge: 2013-01-27 | Disposition: A | Discharge status: Home / Self Care | Payer: MEDICARE | Source: Ambulatory Visit | Attending: Nephrology | Admitting: Nephrology

## 2013-01-27 DIAGNOSIS — D509 Iron deficiency anemia, unspecified: Secondary | ICD-10-CM | POA: Insufficient documentation

## 2013-01-27 LAB — POCT HEMOGLOBIN-HEMACUE: Hemoglobin: 12.9 g/dL (ref 12.0–15.0)

## 2013-01-27 LAB — IRON AND TIBC
Iron: 56 ug/dL (ref 42–135)
Saturation Ratios: 23 % (ref 20–55)
TIBC: 248 ug/dL — ABNORMAL LOW (ref 250–470)

## 2013-01-27 MED ORDER — EPOETIN ALFA 20000 UNIT/ML IJ SOLN: 15000.0000 [IU] | INTRAMUSCULAR | Status: DC

## 2013-02-05 ENCOUNTER — Ambulatory Visit (INDEPENDENT_AMBULATORY_CARE_PROVIDER_SITE_OTHER): Payer: MEDICARE | Admitting: Pharmacist Clinician (PhC)/ Clinical Pharmacy Specialist

## 2013-02-05 VITALS — BP 140/86 | HR 76

## 2013-02-05 DIAGNOSIS — I4891 Unspecified atrial fibrillation: Secondary | ICD-10-CM

## 2013-02-05 DIAGNOSIS — I480 Paroxysmal atrial fibrillation: Secondary | ICD-10-CM

## 2013-02-05 DIAGNOSIS — Z7901 Long term (current) use of anticoagulants: Secondary | ICD-10-CM

## 2013-02-07 ENCOUNTER — Other Ambulatory Visit: Payer: Self-pay | Admitting: *Deleted

## 2013-02-07 DIAGNOSIS — Z79899 Other long term (current) drug therapy: Secondary | ICD-10-CM

## 2013-02-07 LAB — CBC
HCT: 41.6 % (ref 36.0–46.0)
Hemoglobin: 13.5 g/dL (ref 12.0–15.0)
MCH: 30.6 pg (ref 26.0–34.0)
MCHC: 32.5 g/dL (ref 30.0–36.0)
RDW: 15.2 % (ref 11.5–15.5)

## 2013-02-13 ENCOUNTER — Ambulatory Visit (INDEPENDENT_AMBULATORY_CARE_PROVIDER_SITE_OTHER): Payer: MEDICARE | Admitting: Pharmacist Clinician (PhC)/ Clinical Pharmacy Specialist

## 2013-02-13 VITALS — BP 150/82 | HR 72

## 2013-02-13 DIAGNOSIS — I4891 Unspecified atrial fibrillation: Secondary | ICD-10-CM

## 2013-02-13 DIAGNOSIS — I480 Paroxysmal atrial fibrillation: Secondary | ICD-10-CM

## 2013-02-13 DIAGNOSIS — Z7901 Long term (current) use of anticoagulants: Secondary | ICD-10-CM

## 2013-02-13 LAB — POCT INR: INR: 1.5

## 2013-02-21 ENCOUNTER — Other Ambulatory Visit (HOSPITAL_COMMUNITY): Payer: Self-pay | Admitting: *Deleted

## 2013-02-24 ENCOUNTER — Encounter (HOSPITAL_COMMUNITY)
Admission: RE | Admit: 2013-02-24 | Discharge: 2013-02-24 | Disposition: A | Discharge status: Home / Self Care | Payer: MEDICARE | Source: Ambulatory Visit | Attending: Nephrology | Admitting: Nephrology

## 2013-02-24 ENCOUNTER — Ambulatory Visit (INDEPENDENT_AMBULATORY_CARE_PROVIDER_SITE_OTHER): Payer: MEDICARE | Admitting: Pharmacist Clinician (PhC)/ Clinical Pharmacy Specialist

## 2013-02-24 VITALS — BP 156/80 | HR 80

## 2013-02-24 DIAGNOSIS — Z7901 Long term (current) use of anticoagulants: Secondary | ICD-10-CM

## 2013-02-24 DIAGNOSIS — I480 Paroxysmal atrial fibrillation: Secondary | ICD-10-CM

## 2013-02-24 DIAGNOSIS — D509 Iron deficiency anemia, unspecified: Secondary | ICD-10-CM | POA: Insufficient documentation

## 2013-02-24 DIAGNOSIS — I4891 Unspecified atrial fibrillation: Secondary | ICD-10-CM

## 2013-03-03 ENCOUNTER — Telehealth: Payer: Self-pay | Admitting: Pharmacist Clinician (PhC)/ Clinical Pharmacy Specialist

## 2013-03-17 ENCOUNTER — Ambulatory Visit (INDEPENDENT_AMBULATORY_CARE_PROVIDER_SITE_OTHER): Payer: MEDICARE | Admitting: Pharmacist Clinician (PhC)/ Clinical Pharmacy Specialist

## 2013-03-17 VITALS — BP 132/86 | HR 80

## 2013-03-17 DIAGNOSIS — I480 Paroxysmal atrial fibrillation: Secondary | ICD-10-CM

## 2013-03-17 DIAGNOSIS — I4891 Unspecified atrial fibrillation: Secondary | ICD-10-CM

## 2013-03-17 DIAGNOSIS — Z7901 Long term (current) use of anticoagulants: Secondary | ICD-10-CM

## 2013-03-17 LAB — POCT INR: INR: 2

## 2013-03-21 ENCOUNTER — Other Ambulatory Visit (HOSPITAL_COMMUNITY): Payer: Self-pay

## 2013-03-24 ENCOUNTER — Encounter (HOSPITAL_COMMUNITY)
Admission: RE | Admit: 2013-03-24 | Discharge: 2013-03-24 | Disposition: A | Discharge status: Home / Self Care | Payer: MEDICARE | Source: Ambulatory Visit | Attending: Nephrology | Admitting: Nephrology

## 2013-03-24 ENCOUNTER — Encounter: Payer: Self-pay | Admitting: Cardiovascular Disease

## 2013-03-24 DIAGNOSIS — D509 Iron deficiency anemia, unspecified: Secondary | ICD-10-CM | POA: Insufficient documentation

## 2013-03-24 LAB — IRON AND TIBC: UIBC: 171 ug/dL (ref 125–400)

## 2013-03-24 MED ORDER — EPOETIN ALFA 20000 UNIT/ML IJ SOLN: 15000.0000 [IU] | INTRAMUSCULAR | Status: DC

## 2013-04-14 ENCOUNTER — Ambulatory Visit (INDEPENDENT_AMBULATORY_CARE_PROVIDER_SITE_OTHER): Payer: MEDICARE | Admitting: Pharmacist Clinician (PhC)/ Clinical Pharmacy Specialist

## 2013-04-14 VITALS — BP 140/78 | HR 68

## 2013-04-14 DIAGNOSIS — I480 Paroxysmal atrial fibrillation: Secondary | ICD-10-CM

## 2013-04-14 DIAGNOSIS — I4891 Unspecified atrial fibrillation: Secondary | ICD-10-CM

## 2013-04-14 DIAGNOSIS — Z7901 Long term (current) use of anticoagulants: Secondary | ICD-10-CM

## 2013-04-14 LAB — POCT INR: INR: 5.5

## 2013-04-15 ENCOUNTER — Encounter: Payer: Self-pay | Admitting: Cardiovascular Disease

## 2013-04-15 ENCOUNTER — Ambulatory Visit (INDEPENDENT_AMBULATORY_CARE_PROVIDER_SITE_OTHER): Payer: MEDICARE | Admitting: Cardiovascular Disease

## 2013-04-15 VITALS — BP 132/70 | HR 72 | Resp 20 | Ht 59.0 in | Wt 130.5 lb

## 2013-04-15 DIAGNOSIS — I4891 Unspecified atrial fibrillation: Secondary | ICD-10-CM

## 2013-04-15 DIAGNOSIS — R5381 Other malaise: Secondary | ICD-10-CM

## 2013-04-15 DIAGNOSIS — I5032 Chronic diastolic (congestive) heart failure: Secondary | ICD-10-CM

## 2013-04-15 DIAGNOSIS — I480 Paroxysmal atrial fibrillation: Secondary | ICD-10-CM

## 2013-04-15 DIAGNOSIS — D649 Anemia, unspecified: Secondary | ICD-10-CM

## 2013-04-15 DIAGNOSIS — T82110D Breakdown (mechanical) of cardiac electrode, subsequent encounter: Secondary | ICD-10-CM

## 2013-04-15 DIAGNOSIS — D638 Anemia in other chronic diseases classified elsewhere: Secondary | ICD-10-CM

## 2013-04-15 DIAGNOSIS — Z79899 Other long term (current) drug therapy: Secondary | ICD-10-CM

## 2013-04-15 DIAGNOSIS — Z5189 Encounter for other specified aftercare: Secondary | ICD-10-CM

## 2013-04-15 DIAGNOSIS — I442 Atrioventricular block, complete: Secondary | ICD-10-CM

## 2013-04-15 DIAGNOSIS — I359 Nonrheumatic aortic valve disorder, unspecified: Secondary | ICD-10-CM

## 2013-04-15 DIAGNOSIS — I340 Nonrheumatic mitral (valve) insufficiency: Secondary | ICD-10-CM

## 2013-04-15 DIAGNOSIS — I2510 Atherosclerotic heart disease of native coronary artery without angina pectoris: Secondary | ICD-10-CM

## 2013-04-15 DIAGNOSIS — I350 Nonrheumatic aortic (valve) stenosis: Secondary | ICD-10-CM

## 2013-04-15 DIAGNOSIS — I059 Rheumatic mitral valve disease, unspecified: Secondary | ICD-10-CM

## 2013-04-15 LAB — PACEMAKER DEVICE OBSERVATION

## 2013-04-15 NOTE — Patient Instructions (Signed)
Remote monitoring is used to monitor your Pacemaker of ICD from home. This monitoring reduces the number of office visits required to check your device to one time per year. It allows [place] to keep an eye on the functioning of your device to ensure it is working properly. You are scheduled for a device check from home on July 17, 2013. You may send your transmission at any time that day. If you have a wireless device, the transmission will be sent automatically. After your physician reviews your transmission, you will receive a postcard with your next transmission date.  Your physician recommends that you return for lab work in: At your convenience.  Your physician recommends that you schedule a follow-up appointment in: 6 months.

## 2013-04-16 LAB — MDC_IDC_ENUM_SESS_TYPE_INCLINIC
Battery Impedance: 180 Ohm
Battery Voltage: 2.77 V
Brady Statistic RA Percent Paced: 77 %
Brady Statistic RV Percent Paced: 99.6 %
Lead Channel Impedance Value: 547 Ohm
Lead Channel Pacing Threshold Amplitude: 1.25 V
Lead Channel Pacing Threshold Amplitude: 2.25 V
Lead Channel Pacing Threshold Pulse Width: 1.25 ms
Lead Channel Sensing Intrinsic Amplitude: 15 mV
Lead Channel Setting Pacing Amplitude: 2.5 V
Lead Channel Setting Pacing Amplitude: 5 V
Lead Channel Setting Pacing Pulse Width: 0.4 ms
Lead Channel Setting Sensing Sensitivity: 5.6 mV

## 2013-04-20 ENCOUNTER — Encounter: Payer: Self-pay | Admitting: Cardiovascular Disease

## 2013-04-20 DIAGNOSIS — T82110A Breakdown (mechanical) of cardiac electrode, initial encounter: Secondary | ICD-10-CM | POA: Insufficient documentation

## 2013-04-20 DIAGNOSIS — I442 Atrioventricular block, complete: Secondary | ICD-10-CM | POA: Insufficient documentation

## 2013-04-20 NOTE — Assessment & Plan Note (Signed)
Chronically elevated right atrial pacing threshold, which could limit the device longevity. A conscious decision was made to avoid placing a new atrial lead at the time of her generator change out, in order to avoid potential complications related to chronic anticoagulation and need for overnight hospitalization. Otherwise her pacemaker is functioning normally. 77% atrial pacing, 100% ventricular pacing. Fair episodes of mode switch, the longest of which lasted less than 1 minute. No changes are made to device settings

## 2013-04-20 NOTE — Assessment & Plan Note (Signed)
Appears to be euvolemic clinically, NYHA class II baseline status. He is not receiving ACE inhibitors or angiotensin receptor blockers due to her advanced chronic kidney disease.

## 2013-04-20 NOTE — Assessment & Plan Note (Signed)
Discussed natural history of aortic stenosis again. She has no desire to undergo either open heart surgery or TAVR. She makes it very clear that she would rather pass away naturally.

## 2013-04-20 NOTE — Progress Notes (Signed)
Patient ID: Sible Straley, female   DOB: 19-May-1925, 79 y.o.   MRN: [id number]      Reason for office visit Followup lab heart disease, paroxysmal atrial fibrillation, history of stroke, congestive heart failure, pacemaker for complete heart block  Guilianna is doing reasonably well. She is a retired Engineer, civil (consulting), and her daughter Laura's a cardiology Publishing rights manager. Vernona Rieger tells me that her mother's short-term memory is slowly deteriorating although Mrs. Ernestine Mcmurray denies any memory problems. I must say that during her interview I also notice some issues with forgetfulness.  She has had gradually worsening symptoms of CHF, primarily dyspnea on exertion. She is able to get around the house without any difficulty but becomes short winded when shopping for example. She has not had chest pain and denies syncope. She does not have any palpitations.   Her cardiac problems are due to a combination of mitral stenosis (moderate) and aortic stenosis (moderate to severe) but she has normal left ventricular systolic function and does not have coronary disease. Her management is further complicated by advanced chronic kidney disease, stage IV with an estimated creatinine clearance of around 20 mL per minute. Anemia has also been a big challenge and she has had gastrointestinal bleeding while on warfarin anticoagulation. She requires occasional transfusions since he develop symptomatic heart failure when her hemoglobin is 8 or less.  She has complete heart block and is pacemaker dependent.  It has been roughly one year since she presented with severe right-sided hemiplegia from an embolic stroke related to atrial fibrillation. She received thrombolytics at that time and has had a remarkably good recovery. She has had some problems with her skin probably bullous pemphigoid. 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 Floxin [Ofloxacin] Other (See Comments)    nightmares  . Sulfa Antibiotics Other (See Comments)    From when younger  . Darvocet [Propoxyphene-Acetaminophen] Rash  . Penicillins Rash    Current Outpatient Prescriptions  Medication Sig Dispense Refill  . acetaminophen (TYLENOL) 500 MG tablet Take 500-1,000 mg by mouth 2 (two) times daily as needed for pain.      Marland Kitchen amiodarone (PACERONE) 200 MG tablet Take 200 mg by mouth daily.      . butalbital-acetaminophen-caffeine (FIORICET, ESGIC) 50-325-40 MG per tablet Take 1 tablet by mouth daily as needed for headache.       . calcitRIOL (ROCALTROL) 0.25 MCG capsule Take 0.25 mcg by mouth daily.       . cephALEXin (KEFLEX) 250 MG capsule Take 250 mg by mouth daily.      . diphenhydrAMINE (BENADRYL) 25 MG tablet Take 50 mg by mouth 3 (three) times daily as needed for itching.       . ezetimibe-simvastatin (VYTORIN) 10-20 MG per tablet Take 1 tablet by mouth daily.      . furosemide (LASIX) 40 MG tablet Take 40-80 mg by mouth. Alternate, taking 40 mg (one tablet) every other day and 80 mg (two tablets) the other days.      . insulin glargine (LANTUS) 100 UNIT/ML injection Inject 17 Units into the skin every morning.      Marland Kitchen NOVOLOG FLEXPEN 100 UNIT/ML injection Inject 0-10 Units into the skin 3 (three) times daily before meals. Sliding scale      . potassium chloride (K-DUR) 10 MEQ tablet Take 10-20 mEq by mouth daily. Alternate, taking 10 mEq (one tablet) every other day  and 20 mEq (two tablets) the other days.      . Probiotic Product (ALIGN) 4 MG CAPS Take 1 capsule by mouth daily.      . sennosides-docusate sodium (SENOKOT-S) 8.6-50 MG tablet Take 1 tablet by mouth daily.      Marland Kitchen warfarin (COUMADIN) 5 MG tablet Take 5-7.5 mg by mouth daily. Take 7.5 mg (one and a half tablets) on Mondays, Wednesdays, and Fridays. On other days, take 5 mg (one tablet).      . zolpidem (AMBIEN) 5 MG tablet Take 1 tablet (5 mg total) by mouth at bedtime.  Patient takes another one-half tablet in the middle of night if she wakes up.  30 tablet  3   No current facility-administered medications for this visit.    Past Medical History  Diagnosis Date  . Diabetes mellitus, insulin dependent (IDDM), controlled   . Chronic kidney disease, stage 4, severely decreased GFR     likely related to diabetic and hypertensive nephropathy, no evidence of renal artery stenosis by duplex US in 2008  . Mitral valve disorder   . Pacemaker November 2007    s/p placement complete heart block status  . Near syncope 12/12/2011  . Aortic stenosis, moderate to severe 12/12/2011  . Chronic degenerative aortic valve disease 12/12/2011  . PAF (paroxysmal atrial fibrillation) 12/12/2011  . H/O hemolytic anemia due to drugs 12/12/2011  . SSS (sick sinus syndrome) with medtronic pacemaker, placed 6 years ago, with micro dislodgement of atrial lead, though still paces--for CHB and paf 12/12/2011  . UTI (lower urinary tract infection), curent and history of UTIs on Keflex daily 12/12/2011  . CAD (coronary artery disease), cardiac cath  12/12/2011  . Anemia, secondary to chronic disease 12/12/2011  . Pacemaker complications     Known chronic dysfunction of atrial lead with high pacing trhesholds but good sensing.  . Chronic combined systolic and diastolic CHF, NYHA class 2, in combination with AS, MR 12/15/2011  . Bleeding of eye retinal eye bleed  . Stroke     Past Surgical History  Procedure Laterality Date  . Pacemaker insertion  November 2007    Medtronic EnRhythm dual-chamber [id number]  . Cardiac catheterization  2005    60% stenosis of 1st diagnoal branch, 60-70% stenosis of posterolateral brach of distal RCA, preserved LV with EF 50-55%, diastolic dysfunction  . Eye surgery  retinal eye bleed  . Abdominal hysterectomy      No family history on file.  History   Social History  . Marital Status: Widowed    Spouse Name: N/A    Number of Children: N/A  . Years of Education:  N/A   Occupational History  . Not on file.   Social History Main Topics  . Smoking status: Former Smoker -- 1.00 packs/day for 30 years    Types: Cigarettes    Quit date: 06/05/1980  . Smokeless tobacco: Never Used  . Alcohol Use: No  . Drug Use: No  . Sexual Activity: Not on file   Other Topics Concern  . Not on file   Social History Narrative   Lives with daughter Christia Domke who is NP for [employer]    Review of systems: The patient specifically denies any chest pain at rest or with exertion, dyspnea at rest or with light exertion, orthopnea, paroxysmal nocturnal dyspnea, syncope, palpitations, focal neurological deficits, intermittent claudication, lower extremity edema, unexplained weight gain, cough, hemoptysis or wheezing.  The patient also denies abdominal pain,  nausea, vomiting, dysphagia, diarrhea, constipation, polyuria, polydipsia, dysuria, hematuria, frequency, urgency, abnormal bleeding or bruising, fever, chills, unexpected weight changes, mood swings, change in skin or hair texture, change in voice quality, auditory or visual problems, allergic reactions or rashes, new musculoskeletal complaints other than usual "aches and pains".   PHYSICAL EXAM BP 132/70  Pulse 72  Resp 20  Ht 4\' 11"  (1.499 m)  Wt 130 lb 8 oz (59.194 kg)  BMI 26.34 kg/m2  General: Alert, oriented x3, no distress Head: no evidence of trauma, PERRL, EOMI, no exophtalmos or lid lag, no myxedema, no xanthelasma; normal ears, nose and oropharynx Neck: normal jugular venous pulsations and no hepatojugular reflux; brisk carotid pulses without delay and no carotid bruits Chest: clear to auscultation, no signs of consolidation by percussion or palpation, normal fremitus, symmetrical and full respiratory excursions Cardiovascular: normal position and quality of the apical impulse, regular rhythm, normal first and second heart sounds, no rubs or gallops, mid peaking aortic ejection  murmur radiates to both carotids, holosystolic apical murmur radiating to the exam associated with a very faint diastolic rumble Abdomen: no tenderness or distention, no masses by palpation, no abnormal pulsatility or arterial bruits, normal bowel sounds, no hepatosplenomegaly Extremities: no clubbing, cyanosis or edema; 2+ radial, ulnar and brachial pulses bilaterally; 2+ right femoral, posterior tibial and dorsalis pedis pulses; 2+ left femoral, posterior tibial and dorsalis pedis pulses; no subclavian or femoral bruits Neurological: grossly nonfocal   EKG: AV sequential paced  Lipid Panel     Component Value Date/Time   CHOL 157 03/18/2012 0500   TRIG 146 03/18/2012 0500   HDL 62 03/18/2012 0500   CHOLHDL 2.5 03/18/2012 0500   VLDL 29 03/18/2012 0500   LDLCALC 66 03/18/2012 0500    BMET    Component Value Date/Time   NA 138 05/23/2012 1430   K 3.8 05/23/2012 1430   CL 99 05/23/2012 1430   CO2 26 05/23/2012 1430   GLUCOSE 169* 05/23/2012 1430   BUN 41* 05/23/2012 1430   CREATININE 2.15* 05/23/2012 1430   CALCIUM 9.9 05/23/2012 1430   GFRNONAA 20* 05/23/2012 1430   GFRAA 23* 05/23/2012 1430     ASSESSMENT AND PLAN CHB (complete heart block)    Pacemaker lead malfunction Chronically elevated right atrial pacing threshold, which could limit the device longevity. A conscious decision was made to avoid placing a new atrial lead at the time of her generator change out, in order to avoid potential complications related to chronic anticoagulation and need for overnight hospitalization. Otherwise her pacemaker is functioning normally. 77% atrial pacing, 100% ventricular pacing. Fair episodes of mode switch, the longest of which lasted less than 1 minute. No changes are made to device settings   PAF (paroxysmal atrial fibrillation) Atrial fibrillation has generally been poorly tolerated, probably primarily because of a component of mitral stenosis. No recent occurrence. Fully  anticoagulated. Note history of embolic stroke requiring thrombolytic therapy about one year ago. Anticoagulation should only be interrupted for major surgery or serious bleeding complications." Bridging" should be used when appropriate to stop the anticoagulant for elective procedures.  Aortic stenosis, severe Discussed natural history of aortic stenosis again. She has no desire to undergo either open heart surgery or TAVR. She makes it very clear that she would rather pass away naturally.  Chronic diastolic CHF (congestive heart failure), NYHA class 2 Appears to be euvolemic clinically, NYHA class II baseline status. He is not receiving ACE inhibitors or angiotensin receptor blockers due to her  advanced chronic kidney disease.  Anemia Recently her hemoglobin has improved substantially. Its etiology appears to be primarily GI bleeding although there is likely a component of renal insufficiency and chronic disease. Recent iron studies show excellent transference saturation and her hemoglobin is up to 13.7   Patient Instructions  Remote monitoring is used to monitor your Pacemaker of ICD from home. This monitoring reduces the number of office visits required to check your device to one time per year. It allows [place] to keep an eye on the functioning of your device to ensure it is working properly. You are scheduled for a device check from home on July 17, 2013. You may send your transmission at any time that day. If you have a wireless device, the transmission will be sent automatically. After your physician reviews your transmission, you will receive a postcard with your next transmission date.  Your physician recommends that you return for lab work in: At your convenience.  Your physician recommends that you schedule a follow-up appointment in: 6 months.        Orders Placed This Encounter  Procedures  . Comp Met (CMET)  . TSH  . CBC  . Implantable device check  . EKG 12-Lead    Meds ordered this encounter  Medications  . amiodarone (PACERONE) 200 MG tablet    Sig: Take 200 mg by mouth daily.    Junious Silk, MD, [facility] [phone] office [phone] pager

## 2013-04-20 NOTE — Assessment & Plan Note (Signed)
Atrial fibrillation has generally been poorly tolerated, probably primarily because of a component of mitral stenosis. No recent occurrence. Fully anticoagulated. Note history of embolic stroke requiring thrombolytic therapy about one year ago. Anticoagulation should only be interrupted for major surgery or serious bleeding complications." Bridging" should be used when appropriate to stop the anticoagulant for elective procedures.

## 2013-04-20 NOTE — Assessment & Plan Note (Addendum)
Recently her hemoglobin has improved substantially. Its etiology appears to be primarily GI bleeding although there is likely a component of renal insufficiency and chronic disease. Recent iron studies show excellent transference saturation and her hemoglobin is up to 13.7

## 2013-04-21 ENCOUNTER — Encounter (HOSPITAL_COMMUNITY)
Admission: RE | Admit: 2013-04-21 | Discharge: 2013-04-21 | Disposition: A | Discharge status: Home / Self Care | Payer: MEDICARE | Source: Ambulatory Visit | Attending: Nephrology | Admitting: Nephrology

## 2013-04-21 ENCOUNTER — Other Ambulatory Visit: Payer: Self-pay | Admitting: Cardiovascular Disease

## 2013-04-21 ENCOUNTER — Ambulatory Visit (INDEPENDENT_AMBULATORY_CARE_PROVIDER_SITE_OTHER): Payer: MEDICARE | Admitting: Pharmacist Clinician (PhC)/ Clinical Pharmacy Specialist

## 2013-04-21 VITALS — BP 140/74 | HR 84

## 2013-04-21 DIAGNOSIS — I4891 Unspecified atrial fibrillation: Secondary | ICD-10-CM

## 2013-04-21 DIAGNOSIS — D509 Iron deficiency anemia, unspecified: Secondary | ICD-10-CM | POA: Insufficient documentation

## 2013-04-21 DIAGNOSIS — I480 Paroxysmal atrial fibrillation: Secondary | ICD-10-CM

## 2013-04-21 DIAGNOSIS — Z7901 Long term (current) use of anticoagulants: Secondary | ICD-10-CM

## 2013-04-21 LAB — IRON AND TIBC
Iron: 37 ug/dL — ABNORMAL LOW (ref 42–135)
Saturation Ratios: 15 % — ABNORMAL LOW (ref 20–55)
TIBC: 239 ug/dL — ABNORMAL LOW (ref 250–470)
UIBC: 202 ug/dL (ref 125–400)

## 2013-04-21 MED ORDER — EPOETIN ALFA 20000 UNIT/ML IJ SOLN
INTRAMUSCULAR | Status: AC
  Filled 2013-04-21: qty 1

## 2013-04-21 MED ORDER — EPOETIN ALFA 20000 UNIT/ML IJ SOLN
INTRAMUSCULAR | Status: AC
  Administered 2013-04-21: 15000 [IU] via SUBCUTANEOUS
  Filled 2013-04-21: qty 1

## 2013-04-21 MED ORDER — EPOETIN ALFA 20000 UNIT/ML IJ SOLN
15000.0000 [IU] | INTRAMUSCULAR | Status: DC
  Administered 2013-04-21: 15000 [IU] via SUBCUTANEOUS

## 2013-04-22 LAB — COMPREHENSIVE METABOLIC PANEL
ALT: 14 U/L (ref 0–35)
Albumin: 4.1 g/dL (ref 3.5–5.2)
Alkaline Phosphatase: 84 U/L (ref 39–117)
CO2: 30 mEq/L (ref 19–32)
Chloride: 92 mEq/L — ABNORMAL LOW (ref 96–112)
Glucose, Bld: 292 mg/dL — ABNORMAL HIGH (ref 70–99)
Potassium: 4 mEq/L (ref 3.5–5.3)
Sodium: 132 mEq/L — ABNORMAL LOW (ref 135–145)
Total Bilirubin: 0.5 mg/dL (ref 0.3–1.2)
Total Protein: 6.7 g/dL (ref 6.0–8.3)

## 2013-04-22 LAB — CBC
HCT: 31.5 % — ABNORMAL LOW (ref 36.0–46.0)
MCHC: 33.7 g/dL (ref 30.0–36.0)
MCV: 96.9 fL (ref 78.0–100.0)
Platelets: 321 10*3/uL (ref 150–400)
RDW: 19.1 % — ABNORMAL HIGH (ref 11.5–15.5)

## 2013-04-23 ENCOUNTER — Telehealth: Payer: Self-pay | Admitting: *Deleted

## 2013-04-23 ENCOUNTER — Other Ambulatory Visit: Payer: Self-pay | Admitting: *Deleted

## 2013-04-23 DIAGNOSIS — Z79899 Other long term (current) drug therapy: Secondary | ICD-10-CM

## 2013-04-23 DIAGNOSIS — E039 Hypothyroidism, unspecified: Secondary | ICD-10-CM

## 2013-04-23 MED ORDER — LEVOTHYROXINE SODIUM 50 MCG PO TABS: 50.0000 ug | ORAL_TABLET | Freq: Every day | ORAL | Status: AC

## 2013-04-23 NOTE — Telephone Encounter (Signed)
[facility] contacted to see if a T4 can be run off of recent blood drawn due to TSH results.  T4 will be added and results should be ready tomorrow.

## 2013-04-23 NOTE — Progress Notes (Signed)
Thank you :)

## 2013-04-23 NOTE — Telephone Encounter (Signed)
Message copied by Vita Barley on Wed Apr 23, 2013  4:07 PM ------      Message from: Thurmon Fair      Created: Wed Apr 23, 2013  2:58 PM       T4 is slightly low. [place] levothyroxine 50 mcg daily and check TSH in 2 months. ------

## 2013-04-23 NOTE — Telephone Encounter (Signed)
Lab order given to patient to have done in January 2015.

## 2013-04-23 NOTE — Telephone Encounter (Signed)
Per Dr. Salena Saner start Levothyroxin - sent to pharmacy electronically.

## 2013-05-08 ENCOUNTER — Other Ambulatory Visit: Payer: Self-pay | Admitting: Cardiovascular Disease

## 2013-05-08 ENCOUNTER — Other Ambulatory Visit: Payer: Self-pay | Admitting: *Deleted

## 2013-05-08 DIAGNOSIS — Z79899 Other long term (current) drug therapy: Secondary | ICD-10-CM

## 2013-05-08 LAB — CBC
HCT: 25.2 % — ABNORMAL LOW (ref 34.0–46.6)
MCH: 32.2 pg (ref 26.6–33.0)
Platelets: 319 10*3/uL (ref 150–379)
RDW: 16.1 % — ABNORMAL HIGH (ref 12.3–15.4)
WBC: 5.9 10*3/uL (ref 3.4–10.8)

## 2013-05-09 ENCOUNTER — Other Ambulatory Visit: Payer: Self-pay | Admitting: Physician Assistant

## 2013-05-09 ENCOUNTER — Telehealth: Payer: Self-pay | Admitting: *Deleted

## 2013-05-09 ENCOUNTER — Other Ambulatory Visit: Payer: Self-pay | Admitting: *Deleted

## 2013-05-09 DIAGNOSIS — D649 Anemia, unspecified: Secondary | ICD-10-CM

## 2013-05-09 NOTE — Telephone Encounter (Signed)
Order placed for Type and Cross match.  Will have Wilburt Finlay, PA do the orders for a blood transfusion.  All will be done Monday 8am Short Stay B.

## 2013-05-12 ENCOUNTER — Encounter (HOSPITAL_COMMUNITY)
Admission: RE | Admit: 2013-05-12 | Discharge: 2013-05-12 | Disposition: A | Discharge status: Home / Self Care | Payer: MEDICARE | Source: Ambulatory Visit | Attending: Nephrology | Admitting: Nephrology

## 2013-05-12 ENCOUNTER — Other Ambulatory Visit: Payer: Self-pay | Admitting: *Deleted

## 2013-05-12 VITALS — BP 149/60 | HR 60 | Temp 98.0°F | Resp 20

## 2013-05-12 DIAGNOSIS — Z7901 Long term (current) use of anticoagulants: Secondary | ICD-10-CM

## 2013-05-12 DIAGNOSIS — D649 Anemia, unspecified: Secondary | ICD-10-CM | POA: Insufficient documentation

## 2013-05-12 DIAGNOSIS — I480 Paroxysmal atrial fibrillation: Secondary | ICD-10-CM

## 2013-05-12 LAB — PROTIME-INR
INR: 1.48 (ref 0.00–1.49)
Prothrombin Time: 17.5 seconds — ABNORMAL HIGH (ref 11.6–15.2)

## 2013-05-12 LAB — GLUCOSE, CAPILLARY: Glucose-Capillary: 159 mg/dL — ABNORMAL HIGH (ref 70–99)

## 2013-05-12 LAB — PREPARE RBC (CROSSMATCH)

## 2013-05-12 MED ORDER — DIPHENHYDRAMINE HCL 50 MG/ML IJ SOLN
25.0000 mg | Freq: Once | INTRAMUSCULAR | Status: AC
  Administered 2013-05-12: 10:00:00 25 mg via INTRAVENOUS

## 2013-05-12 MED ORDER — ACETAMINOPHEN 325 MG PO TABS
650.0000 mg | ORAL_TABLET | Freq: Once | ORAL | Status: AC
  Administered 2013-05-12: 650 mg via ORAL

## 2013-05-12 MED ORDER — ACETAMINOPHEN 325 MG PO TABS
ORAL_TABLET | ORAL | Status: AC
  Filled 2013-05-12: qty 2

## 2013-05-12 MED ORDER — DIPHENHYDRAMINE HCL 50 MG/ML IJ SOLN
INTRAMUSCULAR | Status: AC
  Filled 2013-05-12: qty 1

## 2013-05-13 ENCOUNTER — Telehealth: Payer: Self-pay | Admitting: *Deleted

## 2013-05-13 ENCOUNTER — Ambulatory Visit (INDEPENDENT_AMBULATORY_CARE_PROVIDER_SITE_OTHER): Payer: MEDICARE | Admitting: Pharmacist Clinician (PhC)/ Clinical Pharmacy Specialist

## 2013-05-13 DIAGNOSIS — I4891 Unspecified atrial fibrillation: Secondary | ICD-10-CM

## 2013-05-13 DIAGNOSIS — Z7901 Long term (current) use of anticoagulants: Secondary | ICD-10-CM

## 2013-05-13 DIAGNOSIS — D649 Anemia, unspecified: Secondary | ICD-10-CM

## 2013-05-13 DIAGNOSIS — I480 Paroxysmal atrial fibrillation: Secondary | ICD-10-CM

## 2013-05-13 LAB — TYPE AND SCREEN
ABO/RH(D): O POS
Unit division: 0

## 2013-05-13 NOTE — Telephone Encounter (Signed)
Calling to see when her mother will need a repeat CBC.  Spoke w/Dr. C - [facility].  Vernona Rieger notified and order placed.

## 2013-05-16 ENCOUNTER — Other Ambulatory Visit (HOSPITAL_COMMUNITY): Payer: Self-pay | Admitting: *Deleted

## 2013-05-19 ENCOUNTER — Ambulatory Visit: Payer: MEDICARE | Admitting: Pharmacist Clinician (PhC)/ Clinical Pharmacy Specialist

## 2013-05-19 ENCOUNTER — Encounter (HOSPITAL_COMMUNITY)
Admission: RE | Admit: 2013-05-19 | Discharge: 2013-05-19 | Disposition: A | Discharge status: Home / Self Care | Payer: MEDICARE | Source: Ambulatory Visit | Attending: Nephrology | Admitting: Nephrology

## 2013-05-19 ENCOUNTER — Other Ambulatory Visit: Payer: Self-pay | Admitting: *Deleted

## 2013-05-19 DIAGNOSIS — D689 Coagulation defect, unspecified: Secondary | ICD-10-CM

## 2013-05-19 LAB — IRON AND TIBC
Iron: 44 ug/dL (ref 42–135)
TIBC: 328 ug/dL (ref 250–470)
UIBC: 284 ug/dL (ref 125–400)

## 2013-05-19 MED ORDER — EPOETIN ALFA 20000 UNIT/ML IJ SOLN
INTRAMUSCULAR | Status: AC
  Filled 2013-05-19: qty 1

## 2013-05-19 MED ORDER — EPOETIN ALFA 20000 UNIT/ML IJ SOLN: 15000.0000 [IU] | INTRAMUSCULAR | Status: DC

## 2013-05-19 NOTE — Progress Notes (Signed)
Patient refused injection after it was drawn up. She states that her insurance company won't pay for an injection for hgb >11. Called [facility] and spoke with Crystal. Crystal stated that Dr. Darrick Penna has said that he is not going to change the order but patient may refuse med and reschedule for 4 weeks. Procrit wasted in red sharps container.

## 2013-05-22 ENCOUNTER — Ambulatory Visit (INDEPENDENT_AMBULATORY_CARE_PROVIDER_SITE_OTHER): Payer: MEDICARE | Admitting: Pharmacist Clinician (PhC)/ Clinical Pharmacy Specialist

## 2013-05-22 VITALS — BP 152/78 | HR 84

## 2013-05-22 DIAGNOSIS — I480 Paroxysmal atrial fibrillation: Secondary | ICD-10-CM

## 2013-05-22 DIAGNOSIS — Z7901 Long term (current) use of anticoagulants: Secondary | ICD-10-CM

## 2013-05-22 DIAGNOSIS — I4891 Unspecified atrial fibrillation: Secondary | ICD-10-CM

## 2013-06-02 ENCOUNTER — Ambulatory Visit (INDEPENDENT_AMBULATORY_CARE_PROVIDER_SITE_OTHER): Payer: MEDICARE | Admitting: Pharmacist Clinician (PhC)/ Clinical Pharmacy Specialist

## 2013-06-02 ENCOUNTER — Other Ambulatory Visit: Payer: Self-pay | Admitting: Cardiovascular Disease

## 2013-06-02 VITALS — BP 150/82 | HR 72

## 2013-06-02 DIAGNOSIS — I4891 Unspecified atrial fibrillation: Secondary | ICD-10-CM

## 2013-06-02 DIAGNOSIS — Z7901 Long term (current) use of anticoagulants: Secondary | ICD-10-CM

## 2013-06-02 DIAGNOSIS — I480 Paroxysmal atrial fibrillation: Secondary | ICD-10-CM

## 2013-06-03 LAB — BASIC METABOLIC PANEL
BUN: 49 mg/dL — ABNORMAL HIGH (ref 6–23)
Chloride: 96 mEq/L (ref 96–112)
Creat: 2.94 mg/dL — ABNORMAL HIGH (ref 0.50–1.10)
Glucose, Bld: 147 mg/dL — ABNORMAL HIGH (ref 70–99)
Potassium: 4.3 mEq/L (ref 3.5–5.3)

## 2013-06-09 ENCOUNTER — Ambulatory Visit (INDEPENDENT_AMBULATORY_CARE_PROVIDER_SITE_OTHER): Payer: MEDICARE | Admitting: Pharmacist Clinician (PhC)/ Clinical Pharmacy Specialist

## 2013-06-09 VITALS — BP 162/90 | HR 76

## 2013-06-09 DIAGNOSIS — I4891 Unspecified atrial fibrillation: Secondary | ICD-10-CM

## 2013-06-09 DIAGNOSIS — Z7901 Long term (current) use of anticoagulants: Secondary | ICD-10-CM

## 2013-06-09 DIAGNOSIS — I480 Paroxysmal atrial fibrillation: Secondary | ICD-10-CM

## 2013-06-09 LAB — POCT INR: INR: 1.5

## 2013-06-13 ENCOUNTER — Other Ambulatory Visit (HOSPITAL_COMMUNITY): Payer: Self-pay

## 2013-06-16 ENCOUNTER — Encounter (HOSPITAL_COMMUNITY)
Admission: RE | Admit: 2013-06-16 | Discharge: 2013-06-16 | Disposition: A | Discharge status: Home / Self Care | Payer: MEDICARE | Source: Ambulatory Visit | Attending: Nephrology | Admitting: Nephrology

## 2013-06-16 DIAGNOSIS — D649 Anemia, unspecified: Secondary | ICD-10-CM | POA: Insufficient documentation

## 2013-06-16 LAB — IRON AND TIBC
Iron: 50 ug/dL (ref 42–135)
SATURATION RATIOS: 14 % — AB (ref 20–55)
TIBC: 356 ug/dL (ref 250–470)
UIBC: 306 ug/dL (ref 125–400)

## 2013-06-16 LAB — POCT HEMOGLOBIN-HEMACUE: Hemoglobin: 11.3 g/dL — ABNORMAL LOW (ref 12.0–15.0)

## 2013-06-16 MED ORDER — SODIUM CHLORIDE 0.9 % IV SOLN
1020.0000 mg | Freq: Once | INTRAVENOUS | Status: AC
  Administered 2013-06-16: 15:00:00 1020 mg via INTRAVENOUS
  Filled 2013-06-16: qty 34

## 2013-06-16 MED ORDER — EPOETIN ALFA 20000 UNIT/ML IJ SOLN: 15000.0000 [IU] | INTRAMUSCULAR | Status: DC

## 2013-06-18 ENCOUNTER — Other Ambulatory Visit: Payer: Self-pay | Admitting: *Deleted

## 2013-06-18 MED ORDER — FUROSEMIDE 40 MG PO TABS: 40.0000 mg | ORAL_TABLET | Freq: Every day | ORAL | Status: DC

## 2013-06-18 MED ORDER — FUROSEMIDE 40 MG PO TABS: ORAL_TABLET | ORAL | Status: AC

## 2013-06-23 ENCOUNTER — Ambulatory Visit (INDEPENDENT_AMBULATORY_CARE_PROVIDER_SITE_OTHER): Payer: MEDICARE | Admitting: Pharmacist Clinician (PhC)/ Clinical Pharmacy Specialist

## 2013-06-23 VITALS — BP 154/80 | HR 84

## 2013-06-23 DIAGNOSIS — I480 Paroxysmal atrial fibrillation: Secondary | ICD-10-CM

## 2013-06-23 DIAGNOSIS — Z7901 Long term (current) use of anticoagulants: Secondary | ICD-10-CM

## 2013-06-23 DIAGNOSIS — I4891 Unspecified atrial fibrillation: Secondary | ICD-10-CM

## 2013-06-23 LAB — POCT INR: INR: 4.7

## 2013-06-30 ENCOUNTER — Encounter (HOSPITAL_COMMUNITY): Payer: MEDICARE

## 2013-07-09 ENCOUNTER — Ambulatory Visit (INDEPENDENT_AMBULATORY_CARE_PROVIDER_SITE_OTHER): Payer: MEDICARE | Admitting: Pharmacist Clinician (PhC)/ Clinical Pharmacy Specialist

## 2013-07-09 VITALS — BP 160/80 | HR 72

## 2013-07-09 DIAGNOSIS — I4891 Unspecified atrial fibrillation: Secondary | ICD-10-CM

## 2013-07-09 DIAGNOSIS — I480 Paroxysmal atrial fibrillation: Secondary | ICD-10-CM

## 2013-07-09 DIAGNOSIS — Z7901 Long term (current) use of anticoagulants: Secondary | ICD-10-CM

## 2013-07-09 LAB — POCT INR: INR: 1.5

## 2013-07-10 ENCOUNTER — Other Ambulatory Visit (HOSPITAL_COMMUNITY): Payer: Self-pay | Admitting: *Deleted

## 2013-07-10 LAB — TSH: TSH: 3.808 u[IU]/mL (ref 0.350–4.500)

## 2013-07-11 ENCOUNTER — Encounter (HOSPITAL_COMMUNITY): Payer: MEDICARE

## 2013-07-14 ENCOUNTER — Encounter (HOSPITAL_COMMUNITY): Payer: Self-pay | Admitting: Emergency Medicine

## 2013-07-14 ENCOUNTER — Emergency Department (INDEPENDENT_AMBULATORY_CARE_PROVIDER_SITE_OTHER)
Admission: EM | Admit: 2013-07-14 | Discharge: 2013-07-14 | Disposition: A | Discharge status: Home / Self Care | Payer: MEDICARE | Source: Home / Self Care

## 2013-07-14 ENCOUNTER — Encounter (HOSPITAL_COMMUNITY)
Admission: RE | Admit: 2013-07-14 | Discharge: 2013-07-14 | Disposition: A | Discharge status: Home / Self Care | Payer: MEDICARE | Source: Ambulatory Visit | Attending: Nephrology | Admitting: Nephrology

## 2013-07-14 DIAGNOSIS — D649 Anemia, unspecified: Secondary | ICD-10-CM | POA: Insufficient documentation

## 2013-07-14 DIAGNOSIS — M25579 Pain in unspecified ankle and joints of unspecified foot: Secondary | ICD-10-CM

## 2013-07-14 DIAGNOSIS — M79609 Pain in unspecified limb: Secondary | ICD-10-CM

## 2013-07-14 DIAGNOSIS — M2040 Other hammer toe(s) (acquired), unspecified foot: Secondary | ICD-10-CM

## 2013-07-14 DIAGNOSIS — M79671 Pain in right foot: Secondary | ICD-10-CM

## 2013-07-14 DIAGNOSIS — M25571 Pain in right ankle and joints of right foot: Secondary | ICD-10-CM

## 2013-07-14 DIAGNOSIS — M2041 Other hammer toe(s) (acquired), right foot: Secondary | ICD-10-CM

## 2013-07-14 HISTORY — DX: Cerebral infarction, unspecified: I63.9

## 2013-07-14 HISTORY — DX: Essential (primary) hypertension: I10

## 2013-07-14 LAB — IRON AND TIBC
Iron: 75 ug/dL (ref 42–135)
SATURATION RATIOS: 33 % (ref 20–55)
TIBC: 227 ug/dL — ABNORMAL LOW (ref 250–470)
UIBC: 152 ug/dL (ref 125–400)

## 2013-07-14 LAB — POCT HEMOGLOBIN-HEMACUE: HEMOGLOBIN: 11.1 g/dL — AB (ref 12.0–15.0)

## 2013-07-14 MED ORDER — HYDROCODONE-ACETAMINOPHEN 5-325 MG PO TABS: ORAL_TABLET | ORAL | Status: DC

## 2013-07-14 MED ORDER — EPOETIN ALFA 20000 UNIT/ML IJ SOLN: 15000.0000 [IU] | INTRAMUSCULAR | Status: DC

## 2013-07-14 MED ORDER — SODIUM CHLORIDE 0.9 % IV SOLN
1020.0000 mg | Freq: Once | INTRAVENOUS | Status: AC
  Administered 2013-07-14: 1020 mg via INTRAVENOUS
  Filled 2013-07-14: qty 34

## 2013-07-14 NOTE — Discharge Instructions (Signed)
Pain of Unknown Etiology (Pain Without a Known Cause) °You have come to your caregiver because of pain. Pain can occur in any part of the body. Often there is not a definite cause. If your laboratory (blood or urine) work was normal and X-rays or other studies were normal, your caregiver may treat you without knowing the cause of the pain. An example of this is the headache. Most headaches are diagnosed by taking a history. This means your caregiver asks you questions about your headaches. Your caregiver determines a treatment based on your answers. Usually testing done for headaches is normal. Often testing is not done unless there is no response to medications. Regardless of where your pain is located today, you can be given medications to make you comfortable. If no physical cause of pain can be found, most cases of pain will gradually leave as suddenly as they came.  °If you have a painful condition and no reason can be found for the pain, it is important that you follow up with your caregiver. If the pain becomes worse or does not go away, it may be necessary to repeat tests and look further for a possible cause. °· Only take over-the-counter or prescription medicines for pain, discomfort, or fever as directed by your caregiver. °· For the protection of your privacy, test results cannot be given over the phone. Make sure you receive the results of your test. Ask how these results are to be obtained if you have not been informed. It is your responsibility to obtain your test results. °· You may continue all activities unless the activities cause more pain. When the pain lessens, it is important to gradually resume normal activities. Resume activities by beginning slowly and gradually increasing the intensity and duration of the activities or exercise. During periods of severe pain, bed rest may be helpful. Lie or sit in any position that is comfortable. °· Ice used for acute (sudden) conditions may be effective.  Use a large plastic bag filled with ice and wrapped in a towel. This may provide pain relief. °· See your caregiver for continued problems. Your caregiver can help or refer you for exercises or physical therapy if necessary. °If you were given medications for your condition, do not drive, operate machinery or power tools, or sign legal documents for 24 hours. Do not drink alcohol, take sleeping pills, or take other medications that may interfere with treatment. °See your caregiver immediately if you have pain that is becoming worse and not relieved by medications. °Document Released: 02/14/2001 Document Revised: 03/12/2013 Document Reviewed: 05/22/2005 °[employer]® Patient Information ©2014 [employer]. ° °
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
## 2013-07-14 NOTE — ED Notes (Signed)
C/o R medial ankle pain onset last night @ 1030.  No known injury or swelling noted.  No hx. of same pain.  Hx. Fx. to same x 2, 2000 and 2001 with screws and plate.

## 2013-07-16 NOTE — ED Provider Notes (Signed)
Medical screening examination/treatment/procedure(s) were performed by resident physician or non-physician practitioner and as supervising physician I was immediately available for consultation/collaboration.   Anetria Harwick DOUGLAS MD.   Dravin Lance D Dan Dissinger, MD 07/16/13 1946 

## 2013-07-17 ENCOUNTER — Encounter: Payer: MEDICARE | Admitting: *Deleted

## 2013-07-21 ENCOUNTER — Ambulatory Visit (INDEPENDENT_AMBULATORY_CARE_PROVIDER_SITE_OTHER): Payer: MEDICARE | Admitting: Podiatry

## 2013-07-21 ENCOUNTER — Encounter: Payer: Self-pay | Admitting: Podiatry

## 2013-07-21 VITALS — BP 152/86 | HR 65 | Resp 18

## 2013-07-21 DIAGNOSIS — B351 Tinea unguium: Secondary | ICD-10-CM

## 2013-07-21 DIAGNOSIS — E1049 Type 1 diabetes mellitus with other diabetic neurological complication: Secondary | ICD-10-CM

## 2013-07-21 NOTE — Patient Instructions (Signed)
Contact a dermatologist to evaluate and treat the chronic skin inflammation on the left foot.

## 2013-07-21 NOTE — Progress Notes (Signed)
   Subjective:    Patient ID: Lindsay AlanisMartha Parker, female    DOB: [birth year], 80 y.o.   MRN: [id number]  HPI right foot had a cramp in it a week ago and kept me up all night and went to the urgent care and gave me something for pain and to see my foot doctor and I took a half of oxycodone and I slept all night and it has not bothered me since then and I saw the doctor about my left foot has a rash on it but it doesn't completely  go away. Patient also requests toenail debridement      Review of Systems     Objective:   Physical Exam Orientated x3 white female  Vascular: DP and PT pulses are 2/4 bilaterally  Neurological: Sensation detained in monofilament wire intact forward/10 right and 2/10 left. Vibratory sensation diminished bilaterally. Knee and ankle reflexes are reactive bilaterally  Dermatological: Discolored, incurvated brittle toenails 6-10. A circular erythematous red lesion noted in the dorsal left foot measuring 5 x 2 cm. There is no active warmth, drainage or malodor surrounding this lesion.  Musculoskeletal: HAV deformities bilaterally       Assessment & Plan:  Assessment: Diabetic peripheral neuropathy Mycotic toenails Dermatitis left foot undetermined origin  Plan patient advised that she does have diabetic neuropathy-like symptoms. At this time her symptoms seem to improve; recommend any medication was the symptoms increase.  Advised patient to contact dermatologist to evaluate the dermatitis/skin lesion on her left foot. (Please note that the skin lesion is somewhat recurrent and has been treated with antibiotics topical and oral and tensor recur).  Nails x10 debrided without any bleeding. Reappoint at patient's request.

## 2013-07-23 ENCOUNTER — Encounter: Payer: Self-pay | Admitting: *Deleted

## 2013-07-24 ENCOUNTER — Ambulatory Visit (INDEPENDENT_AMBULATORY_CARE_PROVIDER_SITE_OTHER): Payer: MEDICARE | Admitting: Pharmacist Clinician (PhC)/ Clinical Pharmacy Specialist

## 2013-07-24 VITALS — BP 150/84 | HR 72

## 2013-07-24 DIAGNOSIS — Z7901 Long term (current) use of anticoagulants: Secondary | ICD-10-CM | POA: Diagnosis not present

## 2013-07-24 DIAGNOSIS — I4891 Unspecified atrial fibrillation: Secondary | ICD-10-CM

## 2013-07-24 DIAGNOSIS — I480 Paroxysmal atrial fibrillation: Secondary | ICD-10-CM

## 2013-07-24 LAB — POCT INR: INR: 3.1

## 2013-08-03 ENCOUNTER — Encounter (HOSPITAL_COMMUNITY): Payer: Self-pay | Admitting: Emergency Medicine

## 2013-08-03 ENCOUNTER — Emergency Department (HOSPITAL_COMMUNITY): Payer: MEDICARE

## 2013-08-03 ENCOUNTER — Inpatient Hospital Stay (HOSPITAL_COMMUNITY)
Admission: EM | Admit: 2013-08-03 | Discharge: 2013-08-09 | DRG: 811 | Disposition: A | Discharge status: Hospice | Payer: MEDICARE | Attending: Internal Medicine | Admitting: Internal Medicine

## 2013-08-03 DIAGNOSIS — Z794 Long term (current) use of insulin: Secondary | ICD-10-CM

## 2013-08-03 DIAGNOSIS — E785 Hyperlipidemia, unspecified: Secondary | ICD-10-CM

## 2013-08-03 DIAGNOSIS — Z950 Presence of cardiac pacemaker: Secondary | ICD-10-CM

## 2013-08-03 DIAGNOSIS — I120 Hypertensive chronic kidney disease with stage 5 chronic kidney disease or end stage renal disease: Secondary | ICD-10-CM | POA: Diagnosis present

## 2013-08-03 DIAGNOSIS — I080 Rheumatic disorders of both mitral and aortic valves: Secondary | ICD-10-CM | POA: Diagnosis present

## 2013-08-03 DIAGNOSIS — N058 Unspecified nephritic syndrome with other morphologic changes: Secondary | ICD-10-CM | POA: Diagnosis present

## 2013-08-03 DIAGNOSIS — I340 Nonrheumatic mitral (valve) insufficiency: Secondary | ICD-10-CM

## 2013-08-03 DIAGNOSIS — D62 Acute posthemorrhagic anemia: Principal | ICD-10-CM

## 2013-08-03 DIAGNOSIS — I2510 Atherosclerotic heart disease of native coronary artery without angina pectoris: Secondary | ICD-10-CM

## 2013-08-03 DIAGNOSIS — I480 Paroxysmal atrial fibrillation: Secondary | ICD-10-CM

## 2013-08-03 DIAGNOSIS — I495 Sick sinus syndrome: Secondary | ICD-10-CM

## 2013-08-03 DIAGNOSIS — D649 Anemia, unspecified: Secondary | ICD-10-CM

## 2013-08-03 DIAGNOSIS — IMO0001 Reserved for inherently not codable concepts without codable children: Secondary | ICD-10-CM

## 2013-08-03 DIAGNOSIS — E119 Type 2 diabetes mellitus without complications: Secondary | ICD-10-CM

## 2013-08-03 DIAGNOSIS — E1129 Type 2 diabetes mellitus with other diabetic kidney complication: Secondary | ICD-10-CM | POA: Diagnosis present

## 2013-08-03 DIAGNOSIS — N185 Chronic kidney disease, stage 5: Secondary | ICD-10-CM

## 2013-08-03 DIAGNOSIS — I6340 Cerebral infarction due to embolism of unspecified cerebral artery: Secondary | ICD-10-CM

## 2013-08-03 DIAGNOSIS — E039 Hypothyroidism, unspecified: Secondary | ICD-10-CM | POA: Diagnosis present

## 2013-08-03 DIAGNOSIS — I509 Heart failure, unspecified: Secondary | ICD-10-CM

## 2013-08-03 DIAGNOSIS — I5042 Chronic combined systolic (congestive) and diastolic (congestive) heart failure: Secondary | ICD-10-CM | POA: Diagnosis present

## 2013-08-03 DIAGNOSIS — Z7901 Long term (current) use of anticoagulants: Secondary | ICD-10-CM

## 2013-08-03 DIAGNOSIS — I442 Atrioventricular block, complete: Secondary | ICD-10-CM

## 2013-08-03 DIAGNOSIS — G9340 Encephalopathy, unspecified: Secondary | ICD-10-CM

## 2013-08-03 DIAGNOSIS — Z66 Do not resuscitate: Secondary | ICD-10-CM | POA: Diagnosis present

## 2013-08-03 DIAGNOSIS — G8190 Hemiplegia, unspecified affecting unspecified side: Secondary | ICD-10-CM

## 2013-08-03 DIAGNOSIS — I5032 Chronic diastolic (congestive) heart failure: Secondary | ICD-10-CM | POA: Diagnosis present

## 2013-08-03 DIAGNOSIS — T82110A Breakdown (mechanical) of cardiac electrode, initial encounter: Secondary | ICD-10-CM

## 2013-08-03 DIAGNOSIS — Z8673 Personal history of transient ischemic attack (TIA), and cerebral infarction without residual deficits: Secondary | ICD-10-CM

## 2013-08-03 DIAGNOSIS — I350 Nonrheumatic aortic (valve) stenosis: Secondary | ICD-10-CM

## 2013-08-03 DIAGNOSIS — R55 Syncope and collapse: Secondary | ICD-10-CM

## 2013-08-03 DIAGNOSIS — I4891 Unspecified atrial fibrillation: Secondary | ICD-10-CM | POA: Diagnosis present

## 2013-08-03 DIAGNOSIS — I639 Cerebral infarction, unspecified: Secondary | ICD-10-CM

## 2013-08-03 DIAGNOSIS — N390 Urinary tract infection, site not specified: Secondary | ICD-10-CM

## 2013-08-03 DIAGNOSIS — Z515 Encounter for palliative care: Secondary | ICD-10-CM

## 2013-08-03 DIAGNOSIS — Z862 Personal history of diseases of the blood and blood-forming organs and certain disorders involving the immune mechanism: Secondary | ICD-10-CM

## 2013-08-03 DIAGNOSIS — R791 Abnormal coagulation profile: Secondary | ICD-10-CM | POA: Diagnosis present

## 2013-08-03 DIAGNOSIS — Z79899 Other long term (current) drug therapy: Secondary | ICD-10-CM

## 2013-08-03 DIAGNOSIS — Z87891 Personal history of nicotine dependence: Secondary | ICD-10-CM

## 2013-08-03 LAB — URINALYSIS, ROUTINE W REFLEX MICROSCOPIC
BILIRUBIN URINE: NEGATIVE
Glucose, UA: NEGATIVE mg/dL
Ketones, ur: NEGATIVE mg/dL
Nitrite: NEGATIVE
PROTEIN: 30 mg/dL — AB
Specific Gravity, Urine: 1.016 (ref 1.005–1.030)
UROBILINOGEN UA: 0.2 mg/dL (ref 0.0–1.0)
pH: 5.5 (ref 5.0–8.0)

## 2013-08-03 LAB — I-STAT CHEM 8, ED
BUN: 123 mg/dL — ABNORMAL HIGH (ref 6–23)
Calcium, Ion: 1.31 mmol/L — ABNORMAL HIGH (ref 1.13–1.30)
Chloride: 100 mEq/L (ref 96–112)
Creatinine, Ser: 3.3 mg/dL — ABNORMAL HIGH (ref 0.50–1.10)
Glucose, Bld: 153 mg/dL — ABNORMAL HIGH (ref 70–99)
HCT: 19 % — ABNORMAL LOW (ref 36.0–46.0)
HEMOGLOBIN: 6.5 g/dL — AB (ref 12.0–15.0)
POTASSIUM: 4.3 meq/L (ref 3.7–5.3)
SODIUM: 134 meq/L — AB (ref 137–147)
TCO2: 25 mmol/L (ref 0–100)

## 2013-08-03 LAB — COMPREHENSIVE METABOLIC PANEL
ALT: 14 U/L (ref 0–35)
AST: 19 U/L (ref 0–37)
Albumin: 3 g/dL — ABNORMAL LOW (ref 3.5–5.2)
Alkaline Phosphatase: 66 U/L (ref 39–117)
BUN: 111 mg/dL — AB (ref 6–23)
CALCIUM: 9.7 mg/dL (ref 8.4–10.5)
CO2: 24 mEq/L (ref 19–32)
Chloride: 97 mEq/L (ref 96–112)
Creatinine, Ser: 3.28 mg/dL — ABNORMAL HIGH (ref 0.50–1.10)
GFR calc non Af Amer: 12 mL/min — ABNORMAL LOW (ref >90)
GFR, EST AFRICAN AMERICAN: 14 mL/min — AB (ref >90)
GLUCOSE: 132 mg/dL — AB (ref 70–99)
Potassium: 4.4 mEq/L (ref 3.7–5.3)
SODIUM: 136 meq/L — AB (ref 137–147)
Total Bilirubin: <0.2 mg/dL — ABNORMAL LOW (ref 0.3–1.2)
Total Protein: 5.8 g/dL — ABNORMAL LOW (ref 6.0–8.3)

## 2013-08-03 LAB — PROTIME-INR
INR: 4.37 — ABNORMAL HIGH (ref 0.00–1.49)
Prothrombin Time: 40.1 seconds — ABNORMAL HIGH (ref 11.6–15.2)

## 2013-08-03 LAB — CBC WITH DIFFERENTIAL/PLATELET
BASOS ABS: 0 10*3/uL (ref 0.0–0.1)
Basophils Relative: 0 % (ref 0–1)
EOS ABS: 0.1 10*3/uL (ref 0.0–0.7)
EOS PCT: 1 % (ref 0–5)
HCT: 19.9 % — ABNORMAL LOW (ref 36.0–46.0)
Hemoglobin: 6.7 g/dL — CL (ref 12.0–15.0)
Lymphocytes Relative: 19 % (ref 12–46)
Lymphs Abs: 1 10*3/uL (ref 0.7–4.0)
MCH: 32.8 pg (ref 26.0–34.0)
MCHC: 33.7 g/dL (ref 30.0–36.0)
MCV: 97.5 fL (ref 78.0–100.0)
Monocytes Absolute: 0.6 10*3/uL (ref 0.1–1.0)
Monocytes Relative: 12 % (ref 3–12)
NEUTROS PCT: 68 % (ref 43–77)
Neutro Abs: 3.7 10*3/uL (ref 1.7–7.7)
Platelets: 215 10*3/uL (ref 150–400)
RBC: 2.04 MIL/uL — ABNORMAL LOW (ref 3.87–5.11)
RDW: 19.7 % — AB (ref 11.5–15.5)
WBC: 5.4 10*3/uL (ref 4.0–10.5)

## 2013-08-03 LAB — URINE MICROSCOPIC-ADD ON

## 2013-08-03 LAB — I-STAT TROPONIN, ED: TROPONIN I, POC: 0.04 ng/mL (ref 0.00–0.08)

## 2013-08-03 LAB — GLUCOSE, CAPILLARY: Glucose-Capillary: 176 mg/dL — ABNORMAL HIGH (ref 70–99)

## 2013-08-03 LAB — PREPARE RBC (CROSSMATCH)

## 2013-08-03 MED ORDER — ONDANSETRON HCL 4 MG/2ML IJ SOLN: 4.0000 mg | Freq: Four times a day (QID) | INTRAMUSCULAR | Status: DC | PRN

## 2013-08-03 MED ORDER — ZOLPIDEM TARTRATE 5 MG PO TABS
5.0000 mg | ORAL_TABLET | Freq: Every day | ORAL | Status: DC
  Administered 2013-08-04 – 2013-08-06 (×4): 5 mg via ORAL
  Filled 2013-08-03 (×4): qty 1

## 2013-08-03 MED ORDER — HYDROCODONE-ACETAMINOPHEN 5-325 MG PO TABS
1.0000 | ORAL_TABLET | Freq: Four times a day (QID) | ORAL | Status: DC | PRN
  Filled 2013-08-03: qty 1

## 2013-08-03 MED ORDER — CALCITRIOL 0.25 MCG PO CAPS
0.2500 ug | ORAL_CAPSULE | Freq: Every day | ORAL | Status: DC
  Administered 2013-08-04 – 2013-08-09 (×6): 0.25 ug via ORAL
  Filled 2013-08-03 (×6): qty 1

## 2013-08-03 MED ORDER — EZETIMIBE-SIMVASTATIN 10-20 MG PO TABS
1.0000 | ORAL_TABLET | Freq: Every day | ORAL | Status: DC
  Administered 2013-08-04 – 2013-08-09 (×6): 1 via ORAL
  Filled 2013-08-03 (×6): qty 1

## 2013-08-03 MED ORDER — DIPHENHYDRAMINE HCL 25 MG PO TABS
25.0000 mg | ORAL_TABLET | Freq: Three times a day (TID) | ORAL | Status: DC | PRN
  Filled 2013-08-03 (×3): qty 1

## 2013-08-03 MED ORDER — DOCUSATE SODIUM 100 MG PO CAPS
200.0000 mg | ORAL_CAPSULE | Freq: Every day | ORAL | Status: DC
  Administered 2013-08-04 – 2013-08-09 (×6): 200 mg via ORAL
  Filled 2013-08-03 (×6): qty 2

## 2013-08-03 MED ORDER — SEVELAMER CARBONATE 800 MG PO TABS
800.0000 mg | ORAL_TABLET | Freq: Three times a day (TID) | ORAL | Status: DC
  Administered 2013-08-04 – 2013-08-09 (×15): 800 mg via ORAL
  Filled 2013-08-03 (×19): qty 1

## 2013-08-03 MED ORDER — AMIODARONE HCL 200 MG PO TABS
200.0000 mg | ORAL_TABLET | Freq: Every day | ORAL | Status: DC
  Administered 2013-08-04 – 2013-08-09 (×6): 200 mg via ORAL
  Filled 2013-08-03 (×6): qty 1

## 2013-08-03 MED ORDER — SENNOSIDES-DOCUSATE SODIUM 8.6-50 MG PO TABS
1.0000 | ORAL_TABLET | Freq: Every day | ORAL | Status: DC | PRN
  Administered 2013-08-05: 1 via ORAL
  Filled 2013-08-03 (×2): qty 1

## 2013-08-03 MED ORDER — BUTALBITAL-APAP-CAFFEINE 50-325-40 MG PO TABS
1.0000 | ORAL_TABLET | Freq: Every day | ORAL | Status: DC | PRN
  Administered 2013-08-05: 1 via ORAL
  Filled 2013-08-03: qty 1

## 2013-08-03 MED ORDER — INSULIN ASPART 100 UNIT/ML ~~LOC~~ SOLN
0.0000 [IU] | Freq: Three times a day (TID) | SUBCUTANEOUS | Status: DC
  Administered 2013-08-04 (×3): 2 [IU] via SUBCUTANEOUS
  Administered 2013-08-05: 3 [IU] via SUBCUTANEOUS
  Administered 2013-08-06 (×2): 2 [IU] via SUBCUTANEOUS
  Administered 2013-08-06: 3 [IU] via SUBCUTANEOUS
  Administered 2013-08-07 – 2013-08-08 (×3): 2 [IU] via SUBCUTANEOUS
  Administered 2013-08-08: 3 [IU] via SUBCUTANEOUS
  Administered 2013-08-09: 2 [IU] via SUBCUTANEOUS
  Administered 2013-08-09: 1 [IU] via SUBCUTANEOUS

## 2013-08-03 MED ORDER — SODIUM CHLORIDE 0.9 % IJ SOLN
3.0000 mL | Freq: Two times a day (BID) | INTRAMUSCULAR | Status: DC
  Administered 2013-08-04 – 2013-08-08 (×10): 3 mL via INTRAVENOUS

## 2013-08-03 MED ORDER — LEVOTHYROXINE SODIUM 50 MCG PO TABS
50.0000 ug | ORAL_TABLET | Freq: Every day | ORAL | Status: DC
  Administered 2013-08-04 – 2013-08-09 (×6): 50 ug via ORAL
  Filled 2013-08-03 (×7): qty 1

## 2013-08-03 MED ORDER — INSULIN GLARGINE 100 UNIT/ML ~~LOC~~ SOLN
8.0000 [IU] | Freq: Every morning | SUBCUTANEOUS | Status: DC
  Administered 2013-08-04 – 2013-08-09 (×6): 8 [IU] via SUBCUTANEOUS
  Filled 2013-08-03 (×6): qty 0.08

## 2013-08-03 MED ORDER — INSULIN ASPART 100 UNIT/ML ~~LOC~~ SOLN: 0.0000 [IU] | Freq: Every day | SUBCUTANEOUS | Status: DC

## 2013-08-03 MED ORDER — VITAMIN K1 10 MG/ML IJ SOLN
2.0000 mg | Freq: Once | INTRAVENOUS | Status: AC
  Administered 2013-08-04: 2 mg via INTRAVENOUS
  Filled 2013-08-03 (×2): qty 0.2

## 2013-08-03 MED ORDER — WARFARIN - PHARMACIST DOSING INPATIENT
Freq: Every day | Status: DC
  Administered 2013-08-06: 18:00:00

## 2013-08-03 MED ORDER — FUROSEMIDE 40 MG PO TABS
40.0000 mg | ORAL_TABLET | ORAL | Status: DC
  Administered 2013-08-04 – 2013-08-08 (×3): 40 mg via ORAL
  Filled 2013-08-03 (×3): qty 1

## 2013-08-03 MED ORDER — ONDANSETRON HCL 4 MG PO TABS: 4.0000 mg | ORAL_TABLET | Freq: Four times a day (QID) | ORAL | Status: DC | PRN

## 2013-08-03 MED ORDER — SENNA-DOCUSATE SODIUM 8.6-50 MG PO TABS: 1.0000 | ORAL_TABLET | ORAL | Status: DC | PRN

## 2013-08-03 MED ORDER — CEPHALEXIN 250 MG PO CAPS
250.0000 mg | ORAL_CAPSULE | Freq: Every day | ORAL | Status: DC
  Administered 2013-08-04 – 2013-08-09 (×7): 250 mg via ORAL
  Filled 2013-08-03 (×7): qty 1

## 2013-08-03 NOTE — ED Notes (Signed)
Hemoglobin results reported to Dr. Rubin PayorPickering

## 2013-08-03 NOTE — Progress Notes (Signed)
ANTICOAGULATION CONSULT NOTE - Initial Consult  Pharmacy Consult for warfarin Indication: atrial fibrillation  Allergies  Allergen Reactions  . Floxin [Ofloxacin] Other (See Comments)    nightmares  . Sulfa Antibiotics Other (See Comments)    From when younger  . Darvocet [Propoxyphene N-Acetaminophen] Rash  . Penicillins Rash    Patient Measurements: Height: 4\' 11"  (149.9 cm) Weight: 130 lb 3.2 oz (59.058 kg) IBW/kg (Calculated) : 43.2  Vital Signs: Temp: 97.9 F (36.6 C) (03/01 1955) BP: 121/52 mmHg (03/01 2130) Pulse Rate: 68 (03/01 2130)  Labs:  Recent Labs  08/03/13 2010 08/03/13 2029  HGB 6.7* 6.5*  HCT 19.9* 19.0*  PLT 215  --   LABPROT 40.1*  --   INR 4.37*  --   CREATININE  --  3.30*    Estimated Creatinine Clearance: 9.6 ml/min (by C-G formula based on Cr of 3.3).   Medical History: Past Medical History  Diagnosis Date  . Diabetes mellitus, insulin dependent (IDDM), controlled   . Chronic kidney disease, stage 4, severely decreased GFR     likely related to diabetic and hypertensive nephropathy, no evidence of renal artery stenosis by duplex US in 2008  . Mitral valve disorder   . Pacemaker November 2007    s/p placement complete heart block status  . Near syncope 12/12/2011  . Aortic stenosis, moderate to severe 12/12/2011  . Chronic degenerative aortic valve disease 12/12/2011  . PAF (paroxysmal atrial fibrillation) 12/12/2011  . H/O hemolytic anemia due to drugs 12/12/2011  . SSS (sick sinus syndrome) with medtronic pacemaker, placed 6 years ago, with micro dislodgement of atrial lead, though still paces--for CHB and paf 12/12/2011  . UTI (lower urinary tract infection), curent and history of UTIs on Keflex daily 12/12/2011  . CAD (coronary artery disease), cardiac cath  12/12/2011  . Anemia, secondary to chronic disease 12/12/2011  . Pacemaker complications     Known chronic dysfunction of atrial lead with high pacing trhesholds but good sensing.  . Chronic  combined systolic and diastolic CHF, NYHA class 2, in combination with AS, MR 12/15/2011  . Bleeding of eye retinal eye bleed  . Stroke   . Hypertension   . CVA (cerebral infarction)     Medications:  See med history  Assessment: [age] yof presented to the ED with worsening weakness. She is on chronic coumadin for afib. INR is supratherapeutic at 4.37. H/H is 6.5/19, plts are 215. No bleeding noted.   Goal of Therapy:  INR 2-3   Plan:  1. No warfarin tonight 2. Daily INR  Kalissa Grays, Drake LeachRachel Lynn 08/03/2013,9:52 PM

## 2013-08-03 NOTE — ED Provider Notes (Signed)
CSN: [id number]     Arrival date & time 08/03/13  1936 History   First MD Initiated Contact with Patient 08/03/13 1951     Chief Complaint  Patient presents with  . Weakness     (Consider location/radiation/quality/duration/timing/severity/associated sxs/prior Treatment) Patient is a 80 y.o. female presenting with weakness. The history is provided by a significant other and the patient.  Weakness This is a recurrent problem. Pertinent negatives include no chest pain, no abdominal pain, no headaches and no shortness of breath.  patient has a history of severe aortic stenosis and anemia. The anemia is due to her chronic kidney disease. She has somewhat frequent transfusions. She's been out of town for the last week and came back more fatigued. No chest pain. She has less energy. No localizing numbness or weakness. No bleeding. She is on Coumadin. She is more pale than her baseline, per her daughter. She also has a pacemaker. Past Medical History  Diagnosis Date  . Diabetes mellitus, insulin dependent (IDDM), controlled   . Chronic kidney disease, stage 4, severely decreased GFR     likely related to diabetic and hypertensive nephropathy, no evidence of renal artery stenosis by duplex US in 2008  . Mitral valve disorder   . Pacemaker November 2007    s/p placement complete heart block status  . Near syncope 12/12/2011  . Aortic stenosis, moderate to severe 12/12/2011  . Chronic degenerative aortic valve disease 12/12/2011  . PAF (paroxysmal atrial fibrillation) 12/12/2011  . H/O hemolytic anemia due to drugs 12/12/2011  . SSS (sick sinus syndrome) with medtronic pacemaker, placed 6 years ago, with micro dislodgement of atrial lead, though still paces--for CHB and paf 12/12/2011  . UTI (lower urinary tract infection), curent and history of UTIs on Keflex daily 12/12/2011  . CAD (coronary artery disease), cardiac cath  12/12/2011  . Anemia, secondary to chronic disease 12/12/2011  . Pacemaker complications      Known chronic dysfunction of atrial lead with high pacing trhesholds but good sensing.  . Chronic combined systolic and diastolic CHF, NYHA class 2, in combination with AS, MR 12/15/2011  . Bleeding of eye retinal eye bleed  . Stroke   . Hypertension   . CVA (cerebral infarction)    Past Surgical History  Procedure Laterality Date  . Pacemaker insertion  November 2007    Medtronic EnRhythm dual-chamber [id number]  . Cardiac catheterization  2005    60% stenosis of 1st diagnoal branch, 60-70% stenosis of posterolateral brach of distal RCA, preserved LV with EF 50-55%, diastolic dysfunction  . Abdominal hysterectomy    . Fracture surgery Right 2000, 2001    R ankle  . Fracture surgery Left 1992    shoulder  . Eye surgery  retinal eye bleed  . Eye surgery Bilateral 2003    cataracts  . Eye surgery Bilateral 2014    lazar  . Cholecystectomy     No family history on file. History  Substance Use Topics  . Smoking status: Former Smoker -- 1.00 packs/day for 30 years    Types: Cigarettes    Quit date: 06/05/1980  . Smokeless tobacco: Never Used  . Alcohol Use: No   OB History   Grav Para Term Preterm Abortions TAB SAB Ect Mult Living                 Review of Systems  Constitutional: Positive for fatigue. Negative for activity change and appetite change.  Eyes: Negative for pain.  Respiratory: Negative  for chest tightness and shortness of breath.   Cardiovascular: Negative for chest pain and leg swelling.  Gastrointestinal: Negative for nausea, vomiting, abdominal pain and diarrhea.  Genitourinary: Negative for flank pain.  Musculoskeletal: Negative for back pain and neck stiffness.  Skin: Positive for pallor. Negative for rash.  Neurological: Positive for weakness. Negative for numbness and headaches.  Psychiatric/Behavioral: Negative for behavioral problems.      Allergies  Floxin; Sulfa antibiotics; Darvocet; and Penicillins  Home Medications   Current Outpatient  Rx  Name  Route  Sig  Dispense  Refill  . acetaminophen (TYLENOL) 500 MG tablet   Oral   Take 500-1,000 mg by mouth 2 (two) times daily as needed for pain.         Marland Kitchen. amiodarone (PACERONE) 200 MG tablet   Oral   Take 200 mg by mouth daily.         . butalbital-acetaminophen-caffeine (FIORICET, ESGIC) 50-325-40 MG per tablet   Oral   Take 1 tablet by mouth daily as needed for headache.          . calcitRIOL (ROCALTROL) 0.25 MCG capsule   Oral   Take 0.25 mcg by mouth daily.          . cephALEXin (KEFLEX) 250 MG capsule   Oral   Take 250 mg by mouth daily.         Marland Kitchen. dextromethorphan-guaiFENesin (MUCINEX DM) 30-600 MG per 12 hr tablet   Oral   Take 1 tablet by mouth 2 (two) times daily.         . diphenhydrAMINE (BENADRYL) 25 MG tablet   Oral   Take 25 mg by mouth 3 (three) times daily as needed for itching.          . docusate sodium (COLACE) 100 MG capsule   Oral   Take 200 mg by mouth daily.         Marland Kitchen. ezetimibe-simvastatin (VYTORIN) 10-20 MG per tablet   Oral   Take 1 tablet by mouth daily.         . furosemide (LASIX) 40 MG tablet      Alternate, taking 40 mg (one tablet) every other day and 80 mg (two tablets) the other days.   45 tablet   6   . HYDROcodone-acetaminophen (NORCO/VICODIN) 5-325 MG per tablet      1/2 to 1 tablet q 4-6 hours prn pain   15 tablet   0   . insulin glargine (LANTUS) 100 UNIT/ML injection   Subcutaneous   Inject 12 Units into the skin every morning.          Marland Kitchen. levothyroxine (SYNTHROID) 50 MCG tablet   Oral   Take 1 tablet (50 mcg total) by mouth daily before breakfast.   30 tablet   6   . NOVOLOG FLEXPEN 100 UNIT/ML injection   Subcutaneous   Inject 0-10 Units into the skin 3 (three) times daily before meals. Sliding scale         . potassium chloride (K-DUR) 10 MEQ tablet   Oral   Take 10-20 mEq by mouth daily. Alternate, taking 10 mEq (one tablet) every other day and 20 mEq (two tablets) the other  days.         . potassium chloride (K-DUR,KLOR-CON) 10 MEQ tablet               . Probiotic Product (ALIGN) 4 MG CAPS   Oral   Take 1 capsule by mouth daily.         .Marland Kitchen  RENVELA 800 MG tablet   Oral   Take 1 tablet by mouth 3 (three) times daily.         . sennosides-docusate sodium (SENOKOT-S) 8.6-50 MG tablet   Oral   Take 1 tablet by mouth daily.         Marland Kitchen warfarin (COUMADIN) 5 MG tablet   Oral   Take 5-7.5 mg by mouth daily. 5 mg  on Mondays, Wednesdays, and Fridays. On other days, take 7.5 mg (one tablet).         . zolpidem (AMBIEN) 5 MG tablet   Oral   Take 5 mg by mouth at bedtime.          BP 123/40  Pulse 61  Temp(Src) 97.9 F (36.6 C)  Resp 16  Ht 4\' 11"  (1.499 m)  Wt 130 lb 3.2 oz (59.058 kg)  BMI 26.28 kg/m2  SpO2 100% Physical Exam  Nursing note and vitals reviewed. Constitutional: She is oriented to person, place, and time. She appears well-developed and well-nourished.  HENT:  Head: Normocephalic and atraumatic.  Eyes: EOM are normal. Pupils are equal, round, and reactive to light.  Neck: Normal range of motion. Neck supple.  Cardiovascular: Normal rate and regular rhythm.   Murmur heard. Loud systolic murmur loudest at right upper parasternal area.  Pulmonary/Chest: Effort normal and breath sounds normal. No respiratory distress. She has no wheezes. She has no rales.  Abdominal: Soft. Bowel sounds are normal. She exhibits no distension. There is no tenderness. There is no rebound and no guarding.  Musculoskeletal: Normal range of motion. She exhibits edema.  Mild bilateral lower extremity pitting edema.  Neurological: She is alert and oriented to person, place, and time. No cranial nerve deficit.  Skin: Skin is warm and dry. There is pallor.  Psychiatric: She has a normal mood and affect. Her speech is normal.    ED Course  Procedures (including critical care time) Labs Review Labs Reviewed  CBC WITH DIFFERENTIAL - Abnormal;  Notable for the following:    RBC 2.04 (*)    Hemoglobin 6.7 (*)    HCT 19.9 (*)    RDW 19.7 (*)    All other components within normal limits  PROTIME-INR - Abnormal; Notable for the following:    Prothrombin Time 40.1 (*)    INR 4.37 (*)    All other components within normal limits  I-STAT CHEM 8, ED - Abnormal; Notable for the following:    Sodium 134 (*)    BUN 123 (*)    Creatinine, Ser 3.30 (*)    Glucose, Bld 153 (*)    Calcium, Ion 1.31 (*)    Hemoglobin 6.5 (*)    HCT 19.0 (*)    All other components within normal limits  URINALYSIS, ROUTINE W REFLEX MICROSCOPIC  FERRITIN  IRON AND TIBC  COMPREHENSIVE METABOLIC PANEL  I-STAT TROPOININ, ED  PREPARE RBC (CROSSMATCH)  TYPE AND SCREEN   Imaging Review No results found.   EKG Interpretation   Date/Time:  Sunday August 03 2013 19:44:52 EST Ventricular Rate:  127 PR Interval:  386 QRS Duration: 32 QT Interval:  192 QTC Calculation: 279 R Axis:   -3 Text Interpretation:  Sinus rhythm with 1st degree A-V block with frequent  ventricular-paced complexes Right atrial enlargement Confirmed by  Rubin Payor  MD, Harrold Donath [phone]) on 08/03/2013 9:31:37 PM      MDM   Final diagnoses:  None    Patient with anemia. History of same due chronic  kidney disease and is followed in the anemia clinic. She has required previous transfusions. Her hemoglobin is down to 6.7. Will transfuse him in to internal medicine. Her INR is somewhat elevated, however she has no dark stools or history of GI bleeding.    Juliet Rude. Rubin Payor, MD 08/03/13 2132

## 2013-08-03 NOTE — ED Notes (Signed)
Patient with generalized weakness since Friday, getting progressively worse.  Patient states she has been having trouble walking today.

## 2013-08-03 NOTE — H&P (Signed)
[facility] History and Physical  Patient: [name]  ZOX:[id number]  DOB: [birth year]  DOS: the patient was seen and examined on 08/03/2013 PCP: Ginette Otto, MD  Chief Complaint: Generalized weakness  HPI: [name] is a 80 y.o. female with Past medical history of chronic kidney disease, anemia of chronic kidney disease, severe aortic stenosis, moderate MR, complete heart block status post pacemaker, recurrent UTI on Keflex chronically, atrial fibrillation on Coumadin, diabetes mellitus. The patient is coming from home. The patient presented with complaints of generalized weakness that has been ongoing since last 3 days. She mentions that she has a history of anemia secondary to chronic kidney disease and has received blood transfusion back in November for symptomatic anemia. She has received 2 iron transfusion as well last one being 3 weeks ago. She denies any active bleeding, melena, hematochezia, bright red blood per or anywhere else. She denies any nausea vomiting or abdominal pain or acid reflux. She has been complaining of progressively worsening generalized weakness since Friday. She also noted shortness of breath on exertion progressively worsening since Friday. It today after returning from [place] at home she was so weak that she couldn't stand on her feet and therefore the family brought to the hospital. He also noted that since last Tuesday she has been pale. She is on warfarin and her last INR was 3.1 and they have continued her on the same dose of warfarin. Since last few months she has been multiple times both sub-and supratherapeutic with an INR.  Review of Systems: as mentioned in the history of present illness.  A Comprehensive review of the other systems is negative.  Past Medical History  Diagnosis Date  . Diabetes mellitus, insulin dependent (IDDM), controlled   . Chronic kidney disease, stage 4, severely decreased GFR     likely related to diabetic  and hypertensive nephropathy, no evidence of renal artery stenosis by duplex US in 2008  . Mitral valve disorder   . Pacemaker November 2007    s/p placement complete heart block status  . Near syncope 12/12/2011  . Aortic stenosis, moderate to severe 12/12/2011  . Chronic degenerative aortic valve disease 12/12/2011  . PAF (paroxysmal atrial fibrillation) 12/12/2011  . H/O hemolytic anemia due to drugs 12/12/2011  . SSS (sick sinus syndrome) with medtronic pacemaker, placed 6 years ago, with micro dislodgement of atrial lead, though still paces--for CHB and paf 12/12/2011  . UTI (lower urinary tract infection), curent and history of UTIs on Keflex daily 12/12/2011  . CAD (coronary artery disease), cardiac cath  12/12/2011  . Anemia, secondary to chronic disease 12/12/2011  . Pacemaker complications     Known chronic dysfunction of atrial lead with high pacing trhesholds but good sensing.  . Chronic combined systolic and diastolic CHF, NYHA class 2, in combination with AS, MR 12/15/2011  . Bleeding of eye retinal eye bleed  . Stroke   . Hypertension   . CVA (cerebral infarction)    Past Surgical History  Procedure Laterality Date  . Pacemaker insertion  November 2007    Medtronic EnRhythm dual-chamber [id number]  . Cardiac catheterization  2005    60% stenosis of 1st diagnoal branch, 60-70% stenosis of posterolateral brach of distal RCA, preserved LV with EF 50-55%, diastolic dysfunction  . Abdominal hysterectomy    . Fracture surgery Right 2000, 2001    R ankle  . Fracture surgery Left 1992    shoulder  . Eye surgery  retinal eye bleed  . Eye  surgery Bilateral 2003    cataracts  . Eye surgery Bilateral 2014    lazar  . Cholecystectomy     Social History:  reports that she quit smoking about 33 years ago. Her smoking use included Cigarettes. She has a 30 pack-year smoking history. She has never used smokeless tobacco. She reports that she does not drink alcohol or use illicit drugs. Independent for  most of her  ADL.  Allergies  Allergen Reactions  . Floxin [Ofloxacin] Other (See Comments)    nightmares  . Sulfa Antibiotics Other (See Comments)    From when younger  . Darvocet [Propoxyphene N-Acetaminophen] Rash  . Penicillins Rash    No family history on file.  Prior to Admission medications   Medication Sig Start Date End Date Taking? Authorizing Provider  acetaminophen (TYLENOL) 500 MG tablet Take 500-1,000 mg by mouth 2 (two) times daily as needed for pain.   Yes Historical Provider, MD  amiodarone (PACERONE) 200 MG tablet Take 200 mg by mouth daily. 12/28/12  Yes Mihai Croitoru, MD  butalbital-acetaminophen-caffeine (FIORICET, ESGIC) 50-325-40 MG per tablet Take 1 tablet by mouth daily as needed for headache.  07/12/12  Yes Historical Provider, MD  calcitRIOL (ROCALTROL) 0.25 MCG capsule Take 0.25 mcg by mouth daily.    Yes Historical Provider, MD  cephALEXin (KEFLEX) 250 MG capsule Take 250 mg by mouth daily.   Yes Historical Provider, MD  dextromethorphan-guaiFENesin (MUCINEX DM) 30-600 MG per 12 hr tablet Take 1 tablet by mouth 2 (two) times daily.   Yes Historical Provider, MD  diphenhydrAMINE (BENADRYL) 25 MG tablet Take 25 mg by mouth 3 (three) times daily as needed for itching.    Yes Historical Provider, MD  docusate sodium (COLACE) 100 MG capsule Take 200 mg by mouth daily.   Yes Historical Provider, MD  ezetimibe-simvastatin (VYTORIN) 10-20 MG per tablet Take 1 tablet by mouth daily.   Yes Historical Provider, MD  furosemide (LASIX) 40 MG tablet Alternate, taking 40 mg (one tablet) every other day and 80 mg (two tablets) the other days. 06/18/13  Yes Mihai Croitoru, MD  HYDROcodone-acetaminophen (NORCO/VICODIN) 5-325 MG per tablet 1/2 to 1 tablet q 4-6 hours prn pain 07/14/13  Yes Hayden Rasmussen, NP  insulin glargine (LANTUS) 100 UNIT/ML injection Inject 12 Units into the skin every morning.    Yes Historical Provider, MD  levothyroxine (SYNTHROID) 50 MCG tablet Take 1 tablet  (50 mcg total) by mouth daily before breakfast. 04/23/13  Yes Mihai Croitoru, MD  NOVOLOG FLEXPEN 100 UNIT/ML injection Inject 0-10 Units into the skin 3 (three) times daily before meals. Sliding scale 06/18/12  Yes Historical Provider, MD  potassium chloride (K-DUR) 10 MEQ tablet Take 10-20 mEq by mouth daily. Alternate, taking 10 mEq (one tablet) every other day and 20 mEq (two tablets) the other days.   Yes Historical Provider, MD  potassium chloride (K-DUR,KLOR-CON) 10 MEQ tablet  06/09/13  Yes Historical Provider, MD  Probiotic Product (ALIGN) 4 MG CAPS Take 1 capsule by mouth daily.   Yes Historical Provider, MD  RENVELA 800 MG tablet Take 1 tablet by mouth 3 (three) times daily. 07/04/13  Yes Historical Provider, MD  sennosides-docusate sodium (SENOKOT-S) 8.6-50 MG tablet Take 1 tablet by mouth daily.   Yes Historical Provider, MD  warfarin (COUMADIN) 5 MG tablet Take 5-7.5 mg by mouth daily. 5 mg  on Mondays, Wednesdays, and Fridays. On other days, take 7.5 mg (one tablet).   Yes Historical Provider, MD  zolpidem (AMBIEN) 5  MG tablet Take 5 mg by mouth at bedtime. 12/03/12  Yes Thurmon Fair, MD    Physical Exam: Filed Vitals:   08/03/13 1955  BP: 123/40  Pulse: 61  Temp: 97.9 F (36.6 C)  Resp: 16  Height: 4\' 11"  (1.499 m)  Weight: 59.058 kg (130 lb 3.2 oz)  SpO2: 100%    General: Alert, Awake and Oriented to Time, Place and Person. Appear in mild distress Eyes: PERRL ENT: Oral Mucosa clear , pale moist. Neck: No JVD Cardiovascular: S1 and S2 Present, aortic systolic Murmur, Peripheral Pulses Present Respiratory: Bilateral Air entry equal and Decreased, Clear to Auscultation,  No Crackles, no wheezes Abdomen: Bowel Sound Present, Soft and Non tender Skin: No Rash Extremities: No Pedal edema, no calf tenderness Neurologic: Grossly Unremarkable. Labs on Admission:  CBC:  Recent Labs Lab 08/03/13 2010 08/03/13 2029  WBC 5.4  --   NEUTROABS 3.7  --   HGB 6.7* 6.5*  HCT  19.9* 19.0*  MCV 97.5  --   PLT 215  --     CMP     Component Value Date/Time   NA 134* 08/03/2013 2029   K 4.3 08/03/2013 2029   CL 100 08/03/2013 2029   CO2 30 06/02/2013 1513   GLUCOSE 153* 08/03/2013 2029   BUN 123* 08/03/2013 2029   CREATININE 3.30* 08/03/2013 2029   CREATININE 2.94* 06/02/2013 1513   CALCIUM 9.9 06/02/2013 1513   PROT 6.7 04/21/2013 1509   ALBUMIN 4.1 04/21/2013 1509   AST 23 04/21/2013 1509   ALT 14 04/21/2013 1509   ALKPHOS 84 04/21/2013 1509   BILITOT 0.5 04/21/2013 1509   GFRNONAA 20* 05/23/2012 1430   GFRAA 23* 05/23/2012 1430    No results found for this basename: LIPASE, AMYLASE,  in the last 168 hours No results found for this basename: AMMONIA,  in the last 168 hours  No results found for this basename: CKTOTAL, CKMB, CKMBINDEX, TROPONINI,  in the last 168 hours BNP (last 3 results) No results found for this basename: PROBNP,  in the last 8760 hours  Radiological Exams on Admission: Dg Chest 2 View  08/03/2013   CLINICAL DATA:  Fifty-eight.  Generalized weakness.  EXAM: CHEST  2 VIEW  COMPARISON:  03/18/2012  FINDINGS: Cardiac silhouette is normal in size. Normal mediastinal and hilar contours. Left anterior chest wall sequential pacemaker is stable in well positioned.  Clear lungs.  No pleural effusion pneumothorax.  There are old right-sided rib fractures. The bony thorax is demineralized.  IMPRESSION: No acute cardiopulmonary disease.   Electronically Signed   By: Amie Portland M.D.   On: 08/03/2013 21:30    Assessment/Plan Principal Problem:   Symptomatic anemia Active Problems:   Aortic stenosis, severe   Mitral regurgitation, moderate to severe   PAF (paroxysmal atrial fibrillation)   Diabetes mellitus, insulin dependent (IDDM), controlled   UTI (lower urinary tract infection), curent and history of UTIs on Keflex daily   CAD (coronary artery disease), cardiac cath    Anemia   CKD (chronic kidney disease) stage 5, GFR less than 15 ml/min    Chronic diastolic CHF (congestive heart failure), NYHA class 2   CVA (cerebral infarction)   1. Symptomatic anemia The patient is presenting with complaints of shortness of breath and generalized weakness associated with pallor. Her hemoglobin is 6.5 and INR is supratherapeutic. She denies any bleeding, and she has chronically been progressively becoming more anemic since last few months. Also she has been going to  patient supratherapeutic during her regular INR checkup. At this point I would transfuse her one unit of blood, give her one dose of IV vitamin K, hold her  Warfarin. Check Hemoccult, iron studies. recheck hemoglobin one hour after blood transfusion. Transfuse as needed for hemoglobin less than 9 due to her cardiac condition. I would also recheck her INR level in the morning if she does not have any active bleeding overnight and her hemoglobin stabilizes, I would try to keep her INR within therapeutic range due to her history of CVA and A. Fib.and monitor her H&H every 6 hours.  clear liquid diet at present.  2. atrial fibrillation Holding Coumadin due to bleeding, continue amiodarone  3. aortic stenosis mitral regurgitation Monitor on telemetry  4. chronic UTI  Continue Keflex  5. Diabetes mellitus Place him on sliding scale reducing the dose of Lantus as currently on keeping her on clear liquid diet.  6. history of chronic diastolic heart failure Continue Lasix at home dose.  7. Hypothyroidism Synthroid 50 mcg  DVT Prophylaxis: mechanical compression device Nutrition:  clear liquid diet  Code Status:  DNR/DNI  Family Communication:  daughter was present at bedside, opportunity was given to ask question and all questions were answered satisfactorily at the time of interview. Disposition: Admitted to inpatient in telemetry unit.  Author: Lynden OxfordPranav Jolea Dolle, MD [facility] Pager: [phone] 08/03/2013, 9:49 PM    If 7PM-7AM, please contact  night-coverage [email or web address] Password [email or web address]

## 2013-08-04 ENCOUNTER — Encounter (HOSPITAL_COMMUNITY): Payer: MEDICARE

## 2013-08-04 DIAGNOSIS — I359 Nonrheumatic aortic valve disorder, unspecified: Secondary | ICD-10-CM

## 2013-08-04 DIAGNOSIS — Z7901 Long term (current) use of anticoagulants: Secondary | ICD-10-CM

## 2013-08-04 DIAGNOSIS — I4891 Unspecified atrial fibrillation: Secondary | ICD-10-CM

## 2013-08-04 DIAGNOSIS — E109 Type 1 diabetes mellitus without complications: Secondary | ICD-10-CM

## 2013-08-04 DIAGNOSIS — N185 Chronic kidney disease, stage 5: Secondary | ICD-10-CM

## 2013-08-04 DIAGNOSIS — I5032 Chronic diastolic (congestive) heart failure: Secondary | ICD-10-CM

## 2013-08-04 DIAGNOSIS — N390 Urinary tract infection, site not specified: Secondary | ICD-10-CM

## 2013-08-04 DIAGNOSIS — I059 Rheumatic mitral valve disease, unspecified: Secondary | ICD-10-CM

## 2013-08-04 DIAGNOSIS — D649 Anemia, unspecified: Secondary | ICD-10-CM

## 2013-08-04 LAB — CBC WITH DIFFERENTIAL/PLATELET
Basophils Absolute: 0 10*3/uL (ref 0.0–0.1)
Basophils Relative: 0 % (ref 0–1)
Eosinophils Absolute: 0.1 10*3/uL (ref 0.0–0.7)
Eosinophils Relative: 2 % (ref 0–5)
HEMATOCRIT: 16.3 % — AB (ref 36.0–46.0)
Hemoglobin: 5.4 g/dL — CL (ref 12.0–15.0)
Lymphocytes Relative: 25 % (ref 12–46)
Lymphs Abs: 1.4 10*3/uL (ref 0.7–4.0)
MCH: 32.9 pg (ref 26.0–34.0)
MCHC: 33.1 g/dL (ref 30.0–36.0)
MCV: 99.4 fL (ref 78.0–100.0)
MONO ABS: 0.6 10*3/uL (ref 0.1–1.0)
Monocytes Relative: 10 % (ref 3–12)
Neutro Abs: 3.6 10*3/uL (ref 1.7–7.7)
Neutrophils Relative %: 63 % (ref 43–77)
PLATELETS: 175 10*3/uL (ref 150–400)
RBC: 1.64 MIL/uL — AB (ref 3.87–5.11)
RDW: 19.9 % — ABNORMAL HIGH (ref 11.5–15.5)
WBC: 5.7 10*3/uL (ref 4.0–10.5)

## 2013-08-04 LAB — COMPREHENSIVE METABOLIC PANEL
ALT: 12 U/L (ref 0–35)
AST: 17 U/L (ref 0–37)
Albumin: 2.8 g/dL — ABNORMAL LOW (ref 3.5–5.2)
Alkaline Phosphatase: 58 U/L (ref 39–117)
BUN: 116 mg/dL — AB (ref 6–23)
CALCIUM: 9.7 mg/dL (ref 8.4–10.5)
CO2: 24 mEq/L (ref 19–32)
Chloride: 99 mEq/L (ref 96–112)
Creatinine, Ser: 3.22 mg/dL — ABNORMAL HIGH (ref 0.50–1.10)
GFR, EST AFRICAN AMERICAN: 14 mL/min — AB (ref >90)
GFR, EST NON AFRICAN AMERICAN: 12 mL/min — AB (ref >90)
GLUCOSE: 156 mg/dL — AB (ref 70–99)
Potassium: 4 mEq/L (ref 3.7–5.3)
Sodium: 135 mEq/L — ABNORMAL LOW (ref 137–147)
TOTAL PROTEIN: 5.2 g/dL — AB (ref 6.0–8.3)
Total Bilirubin: <0.2 mg/dL — ABNORMAL LOW (ref 0.3–1.2)

## 2013-08-04 LAB — GLUCOSE, CAPILLARY
Glucose-Capillary: 137 mg/dL — ABNORMAL HIGH (ref 70–99)
Glucose-Capillary: 194 mg/dL — ABNORMAL HIGH (ref 70–99)
Glucose-Capillary: 160 mg/dL — ABNORMAL HIGH (ref 70–99)
Glucose-Capillary: 102 mg/dL — ABNORMAL HIGH (ref 70–99)

## 2013-08-04 LAB — PREPARE RBC (CROSSMATCH)

## 2013-08-04 LAB — IRON AND TIBC
Iron: 132 ug/dL (ref 42–135)
Saturation Ratios: 52 % (ref 20–55)
TIBC: 252 ug/dL (ref 250–470)
UIBC: 120 ug/dL — ABNORMAL LOW (ref 125–400)

## 2013-08-04 LAB — PROTIME-INR
INR: 5.2 (ref 0.00–1.49)
INR: 1.27 (ref 0.00–1.49)
PROTHROMBIN TIME: 46.3 s — AB (ref 11.6–15.2)
PROTHROMBIN TIME: 15.6 s — AB (ref 11.6–15.2)

## 2013-08-04 LAB — FERRITIN: Ferritin: 701 ng/mL — ABNORMAL HIGH (ref 10–291)

## 2013-08-04 MED ORDER — SODIUM POLYSTYRENE SULFONATE 15 GM/60ML PO SUSP: 30.0000 g | Freq: Once | ORAL | Status: DC

## 2013-08-04 MED ORDER — VITAMIN K1 10 MG/ML IJ SOLN
10.0000 mg | Freq: Once | INTRAVENOUS | Status: AC
  Administered 2013-08-04: 10 mg via INTRAVENOUS
  Filled 2013-08-04: qty 1

## 2013-08-04 MED ORDER — FUROSEMIDE 10 MG/ML IJ SOLN
INTRAMUSCULAR | Status: AC
  Filled 2013-08-04: qty 4

## 2013-08-04 MED ORDER — FUROSEMIDE 10 MG/ML IJ SOLN
20.0000 mg | Freq: Once | INTRAMUSCULAR | Status: AC
  Administered 2013-08-04: 07:00:00 via INTRAVENOUS

## 2013-08-04 MED ORDER — FUROSEMIDE 10 MG/ML IJ SOLN
INTRAMUSCULAR | Status: AC
  Administered 2013-08-04: 40 mg
  Filled 2013-08-04: qty 4

## 2013-08-04 MED ORDER — DIPHENHYDRAMINE HCL 25 MG PO CAPS
25.0000 mg | ORAL_CAPSULE | Freq: Once | ORAL | Status: AC
  Administered 2013-08-04: 25 mg via ORAL

## 2013-08-04 NOTE — Progress Notes (Signed)
ANTICOAGULATION CONSULT NOTE - Follow Up Consult  Pharmacy Consult for Warfarin Indication: atrial fibrillation  Allergies  Allergen Reactions  . Floxin [Ofloxacin] Other (See Comments)    nightmares  . Sulfa Antibiotics Other (See Comments)    From when younger  . Darvocet [Propoxyphene N-Acetaminophen] Rash  . Penicillins Rash    Patient Measurements: Height: 4\' 11"  (149.9 cm) Weight: 129 lb 3 oz (58.6 kg) IBW/kg (Calculated) : 43.2  Vital Signs: Temp: 98.6 F (37 C) (03/02 0818) Temp src: Oral (03/02 0818) BP: 115/65 mmHg (03/02 0818) Pulse Rate: 76 (03/02 0818)  Labs:  Recent Labs  08/03/13 2010 08/03/13 2029 08/03/13 2136 08/04/13 0118  HGB 6.7* 6.5*  --  5.4*  HCT 19.9* 19.0*  --  16.3*  PLT 215  --   --  175  LABPROT 40.1*  --   --  46.3*  INR 4.37*  --   --  5.20*  CREATININE  --  3.30* 3.28* 3.22*    Estimated Creatinine Clearance: 9.8 ml/min (by C-G formula based on Cr of 3.22).   Medications:  Scheduled:  . amiodarone  200 mg Oral Daily  . calcitRIOL  0.25 mcg Oral Daily  . cephALEXin  250 mg Oral Daily  . docusate sodium  200 mg Oral Daily  . ezetimibe-simvastatin  1 tablet Oral Daily  . furosemide      . furosemide  40 mg Oral QODAY  . insulin aspart  0-5 Units Subcutaneous QHS  . insulin aspart  0-9 Units Subcutaneous TID WC  . insulin glargine  8 Units Subcutaneous q morning - 10a  . levothyroxine  50 mcg Oral QAC breakfast  . sevelamer carbonate  800 mg Oral TID WC  . sodium chloride  3 mL Intravenous Q12H  . Warfarin - Pharmacist Dosing Inpatient   Does not apply q1800  . zolpidem  5 mg Oral QHS   Assessment: 80 yo F presented to ED 3/1 with cc: weakness.  Pt was found to have low Hgb and elevated INR.  Pt has hx of anemia and denied any active bleeding.  Since admission the INR has increased and the Hgb has decreased.  A blood transfusion and Vit K has been ordered.  Goal of Therapy:  INR 2-3   Plan:  No Coumadin  tonight. Continue daily INR. Follow CBC and continue to monitor for bleeding.  Toys 'R' UsKimberly Kanchan Gal, Pharm.D., BCPS Clinical Pharmacist Pager [phone] 08/04/2013 10:53 AM

## 2013-08-04 NOTE — Progress Notes (Signed)
Utilization review completed.  

## 2013-08-04 NOTE — Progress Notes (Addendum)
[facility]                                                                              Patient Demographics  Lindsay AlanisMartha Parker, is a 80 y.o. female, DOB - 04-Apr-1925, ZOX:[id number]  Admit date - 08/03/2013   Admitting Physician Lindsay OxfordPranav Patel, MD  Outpatient Primary MD for the patient is Lindsay OttoSTONEKING,HAL THOMAS, MD  LOS - 1   Chief Complaint  Patient presents with  . Weakness        Assessment & Plan   Symptomatic anemia -Patient had complaints of shortness of breath and generalized weakness with associated talar -Hemoglobin was found to be 6.5 on admission however currently 5.4 -Iron studies and Fecal occult currently pending -Patient received 2 units of packed red blood cells since admission, with Lasix 20 mg IV between -Will continue to monitor hemoglobin every 6 hours -Patient denies any melena or hematochezia  Atrial fibrillation with supratherapeutic INR -INR was found to be over 5 on admission -Pending repeat INR -Coumadin currently held -Atrial fibrillation currently rate controlled with amiodarone  Aortic stenosis, mitral regurgitation -Stable, on telemetry monitoring  Chronic UTI -Continue Keflex  Diabetes mellitus -Continue Lantus, insulin sliding scale, CBG monitoring -Patient currently on clear liquid diet  History of chronic diastolic heart failure -Continue Lasix -Will place patient on fluid restriction, monitor her daily weights as well as in output  Hypothyroidism -Continue Synthroid  CKD stage V -Baseline creatinine appears to be approximately 2.3, currently 3.22 -GFR 14 -Will continue to monitor  Code Status: DO NOT RESUSCITATE  Family Communication: None at bedside.  Disposition Plan: Admitted  Time Spent in minutes    30 minutes  Procedures none  Consults  none  DVT Prophylaxis  SCDs  Lab Results  Component Value Date   PLT 175 08/04/2013    Medications  Scheduled Meds: . amiodarone  200 mg Oral Daily  . calcitRIOL   0.25 mcg Oral Daily  . cephALEXin  250 mg Oral Daily  . docusate sodium  200 mg Oral Daily  . ezetimibe-simvastatin  1 tablet Oral Daily  . furosemide      . furosemide  40 mg Oral QODAY  . insulin aspart  0-5 Units Subcutaneous QHS  . insulin aspart  0-9 Units Subcutaneous TID WC  . insulin glargine  8 Units Subcutaneous q morning - 10a  . levothyroxine  50 mcg Oral QAC breakfast  . phytonadione (VITAMIN K) IV  10 mg Intravenous Once  . sevelamer carbonate  800 mg Oral TID WC  . sodium chloride  3 mL Intravenous Q12H  . Warfarin - Pharmacist Dosing Inpatient   Does not apply q1800  . zolpidem  5 mg Oral QHS   Continuous Infusions:  PRN Meds:.butalbital-acetaminophen-caffeine, diphenhydrAMINE, HYDROcodone-acetaminophen, ondansetron (ZOFRAN) IV, ondansetron, senna-docusate  Antibiotics    Anti-infectives   Start     Dose/Rate Route Frequency Ordered Stop   08/03/13 2200  cephALEXin (KEFLEX) capsule 250 mg     250 mg Oral Daily 08/03/13 2148          Subjective:   Lindsay AlanisMartha Parker seen and examined today.  Patient states her shortness of breath has improved some.  She still  feels very weak and tired. Denies blood in her stool.    Objective:   Filed Vitals:   08/04/13 0401 08/04/13 0501 08/04/13 0601 08/04/13 0623  BP: 104/37 135/45 130/51 124/52  Pulse: 60 60 61 61  Temp: 98.1 F (36.7 C) 98.2 F (36.8 C) 98.1 F (36.7 C) 98 F (36.7 C)  TempSrc: Oral Oral Oral Oral  Resp: 17 18 18 20   Height:      Weight:      SpO2: 96% 98% 100% 100%    Wt Readings from Last 3 Encounters:  08/03/13 58.6 kg (129 lb 3 oz)  07/14/13 59.875 kg (132 lb)  04/15/13 59.194 kg (130 lb 8 oz)    No intake or output data in the 24 hours ending 08/04/13 0810  Exam  General: Well developed, well nourished, NAD, appears stated age  HEENT: NCAT, PERRLA, EOMI, Anicteic Sclera, pale, mucous membranes moist.   Neck: Supple, no JVD, no masses  Cardiovascular: S1 S2 auscultated,+2/6  SEM  Respiratory: Clear to auscultation bilaterally with equal chest rise  Abdomen: Soft, nontender, nondistended, + bowel sounds  Extremities: warm dry without cyanosis clubbing or edema  Neuro: AAOx3, cranial nerves grossly intact.   Skin: Without rashes exudates or nodules, pale  Psych: Normal affect and demeanor with intact judgement and insight   Data Review   Micro Results No results found for this or any previous visit (from the past 240 hour(s)).  Radiology Reports Dg Chest 2 View  08/03/2013   CLINICAL DATA:  Fifty-eight.  Generalized weakness.  EXAM: CHEST  2 VIEW  COMPARISON:  03/18/2012  FINDINGS: Cardiac silhouette is normal in size. Normal mediastinal and hilar contours. Left anterior chest wall sequential pacemaker is stable in well positioned.  Clear lungs.  No pleural effusion pneumothorax.  There are old right-sided rib fractures. The bony thorax is demineralized.  IMPRESSION: No acute cardiopulmonary disease.   Electronically Signed   By: Amie Portland M.D.   On: 08/03/2013 21:30    CBC  Recent Labs Lab 08/03/13 2010 08/03/13 2029 08/04/13 0118  WBC 5.4  --  5.7  HGB 6.7* 6.5* 5.4*  HCT 19.9* 19.0* 16.3*  PLT 215  --  175  MCV 97.5  --  99.4  MCH 32.8  --  32.9  MCHC 33.7  --  33.1  RDW 19.7*  --  19.9*  LYMPHSABS 1.0  --  1.4  MONOABS 0.6  --  0.6  EOSABS 0.1  --  0.1  BASOSABS 0.0  --  0.0    Chemistries   Recent Labs Lab 08/03/13 2029 08/03/13 2136 08/04/13 0118  NA 134* 136* 135*  K 4.3 4.4 4.0  CL 100 97 99  CO2  --  24 24  GLUCOSE 153* 132* 156*  BUN 123* 111* 116*  CREATININE 3.30* 3.28* 3.22*  CALCIUM  --  9.7 9.7  AST  --  19 17  ALT  --  14 12  ALKPHOS  --  66 58  BILITOT  --  <0.2* <0.2*   ------------------------------------------------------------------------------------------------------------------ estimated creatinine clearance is 9.8 ml/min (by C-G formula based on Cr of  3.22). ------------------------------------------------------------------------------------------------------------------ No results found for this basename: HGBA1C,  in the last 72 hours ------------------------------------------------------------------------------------------------------------------ No results found for this basename: CHOL, HDL, LDLCALC, TRIG, CHOLHDL, LDLDIRECT,  in the last 72 hours ------------------------------------------------------------------------------------------------------------------ No results found for this basename: TSH, T4TOTAL, FREET3, T3FREE, THYROIDAB,  in the last 72 hours ------------------------------------------------------------------------------------------------------------------ No results found for this basename: VITAMINB12,  FOLATE, FERRITIN, TIBC, IRON, RETICCTPCT,  in the last 72 hours  Coagulation profile  Recent Labs Lab 08/03/13 2010 08/04/13 0118  INR 4.37* 5.20*    No results found for this basename: DDIMER,  in the last 72 hours  Cardiac Enzymes No results found for this basename: CK, CKMB, TROPONINI, MYOGLOBIN,  in the last 168 hours ------------------------------------------------------------------------------------------------------------------ No components found with this basename: POCBNP,     Nickolis Diel D.O. on 08/04/2013 at 8:10 AM  Between 7am to 7pm - Pager - [phone]  After 7pm go to [email or web address] - password [email or web address]  And look for the night coverage person covering for me after hours  [facility] Office  [phone]

## 2013-08-05 LAB — CBC
HCT: 25 % — ABNORMAL LOW (ref 36.0–46.0)
HEMOGLOBIN: 8.4 g/dL — AB (ref 12.0–15.0)
MCH: 30.9 pg (ref 26.0–34.0)
MCHC: 33.6 g/dL (ref 30.0–36.0)
MCV: 91.9 fL (ref 78.0–100.0)
Platelets: 189 10*3/uL (ref 150–400)
RBC: 2.72 MIL/uL — ABNORMAL LOW (ref 3.87–5.11)
RDW: 22.4 % — AB (ref 11.5–15.5)
WBC: 8.2 10*3/uL (ref 4.0–10.5)

## 2013-08-05 LAB — BASIC METABOLIC PANEL
BUN: 109 mg/dL — ABNORMAL HIGH (ref 6–23)
CO2: 22 mEq/L (ref 19–32)
Calcium: 9.7 mg/dL (ref 8.4–10.5)
Chloride: 104 mEq/L (ref 96–112)
Creatinine, Ser: 2.76 mg/dL — ABNORMAL HIGH (ref 0.50–1.10)
GFR calc Af Amer: 17 mL/min — ABNORMAL LOW (ref >90)
GFR, EST NON AFRICAN AMERICAN: 15 mL/min — AB (ref >90)
GLUCOSE: 152 mg/dL — AB (ref 70–99)
POTASSIUM: 3.8 meq/L (ref 3.7–5.3)
Sodium: 143 mEq/L (ref 137–147)

## 2013-08-05 LAB — GLUCOSE, CAPILLARY
GLUCOSE-CAPILLARY: 177 mg/dL — AB (ref 70–99)
Glucose-Capillary: 149 mg/dL — ABNORMAL HIGH (ref 70–99)
Glucose-Capillary: 199 mg/dL — ABNORMAL HIGH (ref 70–99)
Glucose-Capillary: 225 mg/dL — ABNORMAL HIGH (ref 70–99)

## 2013-08-05 LAB — PROTIME-INR
INR: 1.14 (ref 0.00–1.49)
PROTHROMBIN TIME: 14.4 s (ref 11.6–15.2)

## 2013-08-05 MED ORDER — HALOPERIDOL LACTATE 5 MG/ML IJ SOLN
2.5000 mg | Freq: Once | INTRAMUSCULAR | Status: AC
  Administered 2013-08-05: 2.5 mg via INTRAVENOUS
  Filled 2013-08-05: qty 1

## 2013-08-05 MED ORDER — WARFARIN SODIUM 7.5 MG PO TABS
7.5000 mg | ORAL_TABLET | Freq: Once | ORAL | Status: AC
  Administered 2013-08-05: 7.5 mg via ORAL
  Filled 2013-08-05: qty 1

## 2013-08-05 MED ORDER — DARBEPOETIN ALFA-POLYSORBATE 100 MCG/0.5ML IJ SOLN
100.0000 ug | Freq: Once | INTRAMUSCULAR | Status: AC
  Administered 2013-08-05: 100 ug via SUBCUTANEOUS
  Filled 2013-08-05: qty 0.5

## 2013-08-05 MED ORDER — TRAMADOL HCL 50 MG PO TABS
50.0000 mg | ORAL_TABLET | Freq: Four times a day (QID) | ORAL | Status: DC | PRN
  Administered 2013-08-05: 50 mg via ORAL
  Filled 2013-08-05: qty 1

## 2013-08-05 MED ORDER — NYSTATIN-TRIAMCINOLONE 100000-0.1 UNIT/GM-% EX CREA
TOPICAL_CREAM | Freq: Two times a day (BID) | CUTANEOUS | Status: DC
  Administered 2013-08-05 – 2013-08-09 (×5): via TOPICAL
  Filled 2013-08-05: qty 30

## 2013-08-05 NOTE — Progress Notes (Signed)
ANTICOAGULATION CONSULT NOTE - Follow Up Consult  Pharmacy Consult for Coumadin Indication: atrial fibrillation  Allergies  Allergen Reactions  . Floxin [Ofloxacin] Other (See Comments)    nightmares  . Sulfa Antibiotics Other (See Comments)    From when younger  . Darvocet [Propoxyphene N-Acetaminophen] Rash  . Penicillins Rash    Patient Measurements: Height: 4\' 11"  (149.9 cm) Weight: 127 lb 10.3 oz (57.9 kg) IBW/kg (Calculated) : 43.2  Vital Signs: Temp: 97.9 F (36.6 C) (03/03 0835) Temp src: Oral (03/03 0835) BP: 114/66 mmHg (03/03 0835) Pulse Rate: 79 (03/03 0835)  Labs:  Recent Labs  08/03/13 2010  08/03/13 2029 08/03/13 2136 08/04/13 0118 08/04/13 1640 08/05/13 0510  HGB 6.7*  --  6.5*  --  5.4*  --  8.4*  HCT 19.9*  --  19.0*  --  16.3*  --  25.0*  PLT 215  --   --   --  175  --  189  LABPROT 40.1*  --   --   --  46.3* 15.6* 14.4  INR 4.37*  --   --   --  5.20* 1.27 1.14  CREATININE  --   < > 3.30* 3.28* 3.22*  --  2.76*  < > = values in this interval not displayed.  Estimated Creatinine Clearance: 11.3 ml/min (by C-G formula based on Cr of 2.76).   Medications:  Scheduled:  . amiodarone  200 mg Oral Daily  . calcitRIOL  0.25 mcg Oral Daily  . cephALEXin  250 mg Oral Daily  . darbepoetin (ARANESP) injection - NON-DIALYSIS  100 mcg Subcutaneous Once  . docusate sodium  200 mg Oral Daily  . ezetimibe-simvastatin  1 tablet Oral Daily  . furosemide  40 mg Oral QODAY  . insulin aspart  0-5 Units Subcutaneous QHS  . insulin aspart  0-9 Units Subcutaneous TID WC  . insulin glargine  8 Units Subcutaneous q morning - 10a  . levothyroxine  50 mcg Oral QAC breakfast  . sevelamer carbonate  800 mg Oral TID WC  . sodium chloride  3 mL Intravenous Q12H  . Warfarin - Pharmacist Dosing Inpatient   Does not apply q1800  . zolpidem  5 mg Oral QHS   Assessment: 80 yo F presented to ED 3/1 with cc: weakness.  Pt was found to have low Hgb and elevated INR.  Pt  has hx of anemia but denied any active bleeding.  Since admission the INR has increased and the Hgb has decreased.  A blood transfusion and Vit K was ordered.  INR has now been reversed to 1.14.  In the setting of afib with no active bleeding, MD ok to restart anticoagulation with Coumadin.  It will likely take several days for INR to become therapeutic after such a large dose of Vitamin K on 3/2.  Home Coumadin dose = 7.5mg  daily except 5mg  on MWF  Goal of Therapy:  INR 2-3   Plan:  Coumadin 7.5mg  PO x 1 tonight. Continue daily INR. Follow CBC and continue to monitor for bleeding.  Toys 'R' UsKimberly Tatsuya Okray, Pharm.D., BCPS Clinical Pharmacist Pager [phone] 08/05/2013 10:32 AM

## 2013-08-05 NOTE — Evaluation (Signed)
Physical Therapy Evaluation Patient Details Name: [name] MRN: [id number] DOB: 20-Jul-1924 Today's Date: 08/05/2013 Time: 4098-1191 PT Time Calculation (min): 12 min  PT Assessment / Plan / Recommendation History of Present Illness  [name] is a 80 y.o. female with Past medical history of chronic kidney disease, anemia of chronic kidney disease, severe aortic stenosis, moderate MR, complete heart block status post pacemaker, recurrent UTI on Keflex chronically, atrial fibrillation on Coumadin, diabetes mellitus. Admitted s/p generalized weakness, anemia.  Clinical Impression  Pt with significant weakness and decreased activity tolerance, will benefit from skilled PT to address deficits and increase functional independence.    PT Assessment  Patient needs continued PT services    Follow Up Recommendations  Home health PT;Supervision/Assistance - 24 hour    Does the patient have the potential to tolerate intense rehabilitation      Barriers to Discharge Inaccessible home environment;Decreased caregiver support stairs to enter, only intermittent supervision available    Equipment Recommendations  None recommended by PT    Recommendations for Other Services     Frequency Min 3X/week    Precautions / Restrictions Restrictions Weight Bearing Restrictions: No   Pertinent Vitals/Pain No c/o pain      Mobility  Bed Mobility General bed mobility comments: pt up in chair Transfers Overall transfer level: Needs assistance Transfers: Sit to/from Stand Sit to Stand: Min guard General transfer comment: steadying assist Ambulation/Gait Ambulation/Gait assistance: Min assist Ambulation Distance (Feet): 5 Feet (5' x 2) Assistive device: 1 person hand held assist Gait velocity interpretation: Below normal speed for age/gender General Gait Details: pt only able to gait 5' with min A before fatigue.  Pt very tired, requiring prolonged seated rest break between walks.  Discussed  d/c plan with pt/daughter.  Both agree that pt needs 24 hour supervision at this time but state they prefer home vs SNF    Exercises     PT Diagnosis: Difficulty walking;Generalized weakness  PT Problem List: Decreased activity tolerance;Decreased balance;Decreased strength;Decreased mobility;Cardiopulmonary status limiting activity PT Treatment Interventions: Gait training;Balance training;Wheelchair mobility training;Therapeutic exercise;DME instruction;Stair training;Neuromuscular re-education;Modalities;Functional mobility training;Therapeutic activities;Patient/family education     PT Goals(Current goals can be found in the care plan section) Acute Rehab PT Goals Patient Stated Goal: go home PT Goal Formulation: With patient/family Time For Goal Achievement: 08/12/13 Potential to Achieve Goals: Good  Visit Information  Last PT Received On: 08/05/13 Assistance Needed: +1 History of Present Illness: [name] is a 80 y.o. female with Past medical history of chronic kidney disease, anemia of chronic kidney disease, severe aortic stenosis, moderate MR, complete heart block status post pacemaker, recurrent UTI on Keflex chronically, atrial fibrillation on Coumadin, diabetes mellitus. Admitted s/p generalized weakness, anemia.       Prior Functioning  Home Living Family/patient expects to be discharged to:: Private residence Living Arrangements: Children Available Help at Discharge: Family;Available PRN/intermittently Type of Home: House Home Access: Stairs to enter [employer] of Steps: 4 Entrance Stairs-Rails: Can reach both Home Layout: One level Home Equipment: Cane - single point;Walker - 2 wheels Additional Comments: pt uses cane for stairs and RW in house Prior Function Level of Independence: Independent with assistive device(s) Communication Communication: No difficulties    Cognition  Cognition Arousal/Alertness: Awake/alert Behavior During Therapy:  WFL for tasks assessed/performed Overall Cognitive Status: Within Functional Limits for tasks assessed    Extremity/Trunk Assessment Upper Extremity Assessment Upper Extremity Assessment: Generalized weakness Lower Extremity Assessment Lower Extremity Assessment: Generalized weakness Cervical / Trunk Assessment Cervical /  Trunk Assessment:  (history of back pain)   Balance    End of Session PT - End of Session Activity Tolerance: Patient limited by fatigue Patient left: in chair;with call bell/phone within reach;with family/visitor present Nurse Communication: Mobility status  GP     DONAWERTH,KAREN 08/05/2013, 1:05 PM

## 2013-08-05 NOTE — Progress Notes (Signed)
Patient increasingly restless and agitated.  Got out of bed multiple times and was reoriented each time.  Alert and oriented only to person.  Stated she did not know where she was and did not believe she was in the hospital.  Was physically combative to nurse.  NP made aware of situation; orders received for haldol 2.5 mg IV.  Medication administered, and patient is now sleeping.  Will continue to monitor.

## 2013-08-05 NOTE — Progress Notes (Addendum)
[facility]                                                                              Patient Demographics  [name], is a 80 y.o. female, DOB - 27-Oct-1924, ZOX:[id number]  Admit date - 08/03/2013   Admitting Physician Lindsay Oxford, MD  Outpatient Primary MD for the patient is Lindsay Otto, MD  LOS - 2   Chief Complaint  Patient presents with  . Weakness      Interim History 80 year old female history of chronic kidney disease, anemia of chronic kidney disease, severe aortic stenosis as well as moderate mitral regurgitation, atrial fibrillation on Coumadin presented to the emergency room with generalized weakness. Patient was found to have a hemoglobin of approximately 6 however her lowest hemoglobin was 5.4. Patient was given 2 units packed red blood cells. Anemia panel showed an adequate amount of iron as well as iron stores. Patient may need outpatient EPO injections or Aranesp.  One dose of Aranesp given. Will consider hematology workup as well. Patient also had a supratherapeutic INR of approximately 5, given approximately 12 units of vitamin K, currently INR 1.14. Pending FOBT.   Assessment & Plan   Symptomatic anemia -Patient had complaints of shortness of breath and generalized weakness with associated talar -Hemoglobin was found to be as low as 5.4, given 2 units PRBCs -Current hemoglobin 8.4 -Fecal occult currently pending -Anemia panel: Iron 132 TIBC 252, Ferritin 701 -Patient denies any melena or hematochezia -Will speak with nephrology regarding possible Epo/Aranesp.   -Consider hematology consult.  Atrial fibrillation with supratherapeutic INR -INR was found to be over 5 on admission, patient was given Vitamin K -INR currently 1.14 -Coumadin per pharmacy -Atrial fibrillation currently rate controlled with amiodarone  Aortic stenosis, mitral regurgitation -Stable, on telemetry monitoring  Chronic UTI -Continue Keflex  Diabetes  mellitus -Continue Lantus, insulin sliding scale, CBG monitoring -Patient currently on clear liquid diet  History of chronic diastolic heart failure -Continue Lasix -Will place patient on fluid restriction, monitor her daily weights as well as in output  Hypothyroidism -Continue Synthroid  CKD stage V -Baseline creatinine appears to be approximately 2.3, currently 3.22 -GFR 14 -Patient is adamant about not having dialysis. -Will need outpatient nephrology followup for possible continued aranesp injections. -Will continue to monitor  Generalized weakness -Will consult PT for eval and treatment. -Likely secondary to anemia.  Code Status: DO NOT RESUSCITATE  Family Communication: None at bedside. Discussed patient with daughter via phone.  Disposition Plan: Admitted.  Will consult PT.  May need outpatient hematology vs nephrology.  Time Spent in minutes   25 minutes  Procedures none  Consults   Nephrology via phone  DVT Prophylaxis  SCDs  Lab Results  Component Value Date   PLT 189 08/05/2013    Medications  Scheduled Meds: . amiodarone  200 mg Oral Daily  . calcitRIOL  0.25 mcg Oral Daily  . cephALEXin  250 mg Oral Daily  . docusate sodium  200 mg Oral Daily  . ezetimibe-simvastatin  1 tablet Oral Daily  . furosemide  40 mg Oral QODAY  . insulin aspart  0-5 Units Subcutaneous QHS  . insulin aspart  0-9 Units Subcutaneous TID WC  . insulin glargine  8 Units Subcutaneous q morning - 10a  . levothyroxine  50 mcg Oral QAC breakfast  . sevelamer carbonate  800 mg Oral TID WC  . sodium chloride  3 mL Intravenous Q12H  . Warfarin - Pharmacist Dosing Inpatient   Does not apply q1800  . zolpidem  5 mg Oral QHS   Continuous Infusions:  PRN Meds:.butalbital-acetaminophen-caffeine, diphenhydrAMINE, HYDROcodone-acetaminophen, ondansetron (ZOFRAN) IV, ondansetron, senna-docusate  Antibiotics    Anti-infectives   Start     Dose/Rate Route Frequency Ordered Stop    08/03/13 2200  cephALEXin (KEFLEX) capsule 250 mg     250 mg Oral Daily 08/03/13 2148          Subjective:   [name] seen and examined today.  Patient states her shortness of breath has improved some.  She still feels very weak and tired. Denies blood in her stool.  Per nursing, patient was altered overnight, however, seems to be back at baseline today.   Objective:   Filed Vitals:   08/04/13 1645 08/04/13 2111 08/05/13 0531 08/05/13 0532  BP: 122/41 103/43 138/57   Pulse: 58 64 62   Temp: 98.3 F (36.8 C) 98.1 F (36.7 C) 97.8 F (36.6 C)   TempSrc: Oral Oral Oral   Resp: 15 18 18    Height:      Weight:  60.2 kg (132 lb 11.5 oz)  57.9 kg (127 lb 10.3 oz)  SpO2: 100% 100% 100%     Wt Readings from Last 3 Encounters:  08/05/13 57.9 kg (127 lb 10.3 oz)  07/14/13 59.875 kg (132 lb)  04/15/13 59.194 kg (130 lb 8 oz)     Intake/Output Summary (Last 24 hours) at 08/05/13 0744 Last data filed at 08/04/13 2037  Gross per 24 hour  Intake 1031.33 ml  Output      0 ml  Net 1031.33 ml    Exam  General: Well developed, well nourished, NAD, appears stated age  HEENT: NCAT, PERRLA, EOMI, Anicteic Sclera, pale, mucous membranes moist.   Neck: Supple, no JVD, no masses  Cardiovascular: S1 S2 auscultated,+2/6 SEM  Respiratory: Clear to auscultation bilaterally with equal chest rise  Abdomen: Soft, nontender, nondistended, + bowel sounds  Extremities: warm dry without cyanosis clubbing or edema  Neuro: AAOx3, cranial nerves grossly intact.   Skin: Without rashes exudates or nodules, pale  Psych: Normal affect and demeanor with intact judgement and insight   Data Review   Micro Results No results found for this or any previous visit (from the past 240 hour(s)).  Radiology Reports Dg Chest 2 View  08/03/2013   CLINICAL DATA:  Fifty-[age].  Generalized weakness.  EXAM: CHEST  2 VIEW  COMPARISON:  03/18/2012  FINDINGS: Cardiac silhouette is normal in size.  Normal mediastinal and hilar contours. Left anterior chest wall sequential pacemaker is stable in well positioned.  Clear lungs.  No pleural effusion pneumothorax.  There are old right-sided rib fractures. The bony thorax is demineralized.  IMPRESSION: No acute cardiopulmonary disease.   Electronically Signed   By: Amie Portland M.D.   On: 08/03/2013 21:30    CBC  Recent Labs Lab 08/03/13 2010 08/03/13 2029 08/04/13 0118 08/05/13 0510  WBC 5.4  --  5.7 8.2  HGB 6.7* 6.5* 5.4* 8.4*  HCT 19.9* 19.0* 16.3* 25.0*  PLT 215  --  175 189  MCV 97.5  --  99.4 91.9  MCH 32.8  --  32.9 30.9  MCHC 33.7  --  33.1 33.6  RDW 19.7*  --  19.9* 22.4*  LYMPHSABS 1.0  --  1.4  --   MONOABS 0.6  --  0.6  --   EOSABS 0.1  --  0.1  --   BASOSABS 0.0  --  0.0  --     Chemistries   Recent Labs Lab 08/03/13 2029 08/03/13 2136 08/04/13 0118 08/05/13 0510  NA 134* 136* 135* 143  K 4.3 4.4 4.0 3.8  CL 100 97 99 104  CO2  --  24 24 22   GLUCOSE 153* 132* 156* 152*  BUN 123* 111* 116* 109*  CREATININE 3.30* 3.28* 3.22* 2.76*  CALCIUM  --  9.7 9.7 9.7  AST  --  19 17  --   ALT  --  14 12  --   ALKPHOS  --  66 58  --   BILITOT  --  <0.2* <0.2*  --    ------------------------------------------------------------------------------------------------------------------ estimated creatinine clearance is 11.3 ml/min (by C-G formula based on Cr of 2.76). ------------------------------------------------------------------------------------------------------------------ No results found for this basename: HGBA1C,  in the last 72 hours ------------------------------------------------------------------------------------------------------------------ No results found for this basename: CHOL, HDL, LDLCALC, TRIG, CHOLHDL, LDLDIRECT,  in the last 72 hours ------------------------------------------------------------------------------------------------------------------ No results found for this basename: TSH,  T4TOTAL, FREET3, T3FREE, THYROIDAB,  in the last 72 hours ------------------------------------------------------------------------------------------------------------------  Recent Labs  08/03/13 2136  FERRITIN 701*  TIBC 252  IRON 132    Coagulation profile  Recent Labs Lab 08/03/13 2010 08/04/13 0118 08/04/13 1640 08/05/13 0510  INR 4.37* 5.20* 1.27 1.14    No results found for this basename: DDIMER,  in the last 72 hours  Cardiac Enzymes No results found for this basename: CK, CKMB, TROPONINI, MYOGLOBIN,  in the last 168 hours ------------------------------------------------------------------------------------------------------------------ No components found with this basename: POCBNP,     Veleta Yamamoto D.O. on 08/05/2013 at 7:44 AM  Between 7am to 7pm - Pager - [phone]  After 7pm go to [email or web address] - password [email or web address]  And look for the night coverage person covering for me after hours  [facility] Office  [phone]

## 2013-08-06 ENCOUNTER — Encounter (HOSPITAL_COMMUNITY): Payer: Self-pay | Admitting: *Deleted

## 2013-08-06 ENCOUNTER — Inpatient Hospital Stay (HOSPITAL_COMMUNITY): Payer: MEDICARE

## 2013-08-06 DIAGNOSIS — I442 Atrioventricular block, complete: Secondary | ICD-10-CM

## 2013-08-06 DIAGNOSIS — I2510 Atherosclerotic heart disease of native coronary artery without angina pectoris: Secondary | ICD-10-CM

## 2013-08-06 LAB — BASIC METABOLIC PANEL
BUN: 107 mg/dL — AB (ref 6–23)
CALCIUM: 9.2 mg/dL (ref 8.4–10.5)
CO2: 23 meq/L (ref 19–32)
Chloride: 104 mEq/L (ref 96–112)
Creatinine, Ser: 2.6 mg/dL — ABNORMAL HIGH (ref 0.50–1.10)
GFR calc Af Amer: 18 mL/min — ABNORMAL LOW (ref >90)
GFR, EST NON AFRICAN AMERICAN: 16 mL/min — AB (ref >90)
GLUCOSE: 152 mg/dL — AB (ref 70–99)
Potassium: 4.5 mEq/L (ref 3.7–5.3)
Sodium: 139 mEq/L (ref 137–147)

## 2013-08-06 LAB — GLUCOSE, CAPILLARY
GLUCOSE-CAPILLARY: 240 mg/dL — AB (ref 70–99)
GLUCOSE-CAPILLARY: 136 mg/dL — AB (ref 70–99)
Glucose-Capillary: 161 mg/dL — ABNORMAL HIGH (ref 70–99)
Glucose-Capillary: 173 mg/dL — ABNORMAL HIGH (ref 70–99)

## 2013-08-06 LAB — PREPARE RBC (CROSSMATCH)

## 2013-08-06 LAB — CBC
HEMATOCRIT: 19.4 % — AB (ref 36.0–46.0)
HEMOGLOBIN: 6.6 g/dL — AB (ref 12.0–15.0)
MCH: 31.9 pg (ref 26.0–34.0)
MCHC: 34 g/dL (ref 30.0–36.0)
MCV: 93.7 fL (ref 78.0–100.0)
Platelets: 162 10*3/uL (ref 150–400)
RBC: 2.07 MIL/uL — ABNORMAL LOW (ref 3.87–5.11)
RDW: 22.7 % — ABNORMAL HIGH (ref 11.5–15.5)
WBC: 7 10*3/uL (ref 4.0–10.5)

## 2013-08-06 LAB — OCCULT BLOOD X 1 CARD TO LAB, STOOL: FECAL OCCULT BLD: POSITIVE — AB

## 2013-08-06 LAB — PROTIME-INR
INR: 1.08 (ref 0.00–1.49)
Prothrombin Time: 13.8 seconds (ref 11.6–15.2)

## 2013-08-06 MED ORDER — DIPHENHYDRAMINE HCL 50 MG PO CAPS
50.0000 mg | ORAL_CAPSULE | Freq: Once | ORAL | Status: AC
  Administered 2013-08-07: 50 mg via ORAL
  Filled 2013-08-06: qty 2
  Filled 2013-08-06: qty 1

## 2013-08-06 MED ORDER — ACETAMINOPHEN 325 MG PO TABS
650.0000 mg | ORAL_TABLET | Freq: Once | ORAL | Status: AC
  Administered 2013-08-06: 650 mg via ORAL
  Filled 2013-08-06: qty 2

## 2013-08-06 NOTE — Progress Notes (Signed)
Jennifer CMT notified pt off the floor to CT.

## 2013-08-06 NOTE — Progress Notes (Signed)
Patient ID: [name], female   DOB: Mar 01, 1925, 80 y.o.   MRN: [id number] Referring Provider: Dr. Arbutus Leas Primary Care Physician:  Ginette Otto, MD Primary Gastroenterologist:  Dr. Dulce Sellar  Reason for Consultation:  Anemia  HPI: [name] is a 80 y.o. female admitted for symptomatic anemia with a Hgb 5.4 (11.1 on 07/14/13). Denies any melena, hematochezia, hematemesis prior to admit. Daughter (who is a Publishing rights manager) reports having a dark black formed stool today with red blood around it. Denies abdominal pain, N/V. Appetite ok but chronically does not eat much. Barium enema a few years ago that showed diverticulosis. Reports last colonoscopy 10 years ago in [place] and records not available to me. On chronic Coumadin and INR of 5.2 on 08/04/13. Hgb 6.6 today. Sons at bedside with daughter.  Past Medical History  Diagnosis Date  . Diabetes mellitus, insulin dependent (IDDM), controlled   . Chronic kidney disease, stage 4, severely decreased GFR     likely related to diabetic and hypertensive nephropathy, no evidence of renal artery stenosis by duplex US in 2008  . Mitral valve disorder   . Pacemaker November 2007    s/p placement complete heart block status  . Near syncope 12/12/2011  . Aortic stenosis, moderate to severe 12/12/2011  . Chronic degenerative aortic valve disease 12/12/2011  . PAF (paroxysmal atrial fibrillation) 12/12/2011  . H/O hemolytic anemia due to drugs 12/12/2011  . SSS (sick sinus syndrome) with medtronic pacemaker, placed 6 years ago, with micro dislodgement of atrial lead, though still paces--for CHB and paf 12/12/2011  . UTI (lower urinary tract infection), curent and history of UTIs on Keflex daily 12/12/2011  . CAD (coronary artery disease), cardiac cath  12/12/2011  . Anemia, secondary to chronic disease 12/12/2011  . Pacemaker complications     Known chronic dysfunction of atrial lead with high pacing trhesholds but good sensing.  . Chronic combined systolic and  diastolic CHF, NYHA class 2, in combination with AS, MR 12/15/2011  . Bleeding of eye retinal eye bleed  . Stroke   . Hypertension   . CVA (cerebral infarction)     Past Surgical History  Procedure Laterality Date  . Pacemaker insertion  November 2007    Medtronic EnRhythm dual-chamber [id number]  . Cardiac catheterization  2005    60% stenosis of 1st diagnoal branch, 60-70% stenosis of posterolateral brach of distal RCA, preserved LV with EF 50-55%, diastolic dysfunction  . Abdominal hysterectomy    . Fracture surgery Right 2000, 2001    R ankle  . Fracture surgery Left 1992    shoulder  . Eye surgery  retinal eye bleed  . Eye surgery Bilateral 2003    cataracts  . Eye surgery Bilateral 2014    lazar  . Cholecystectomy      Prior to Admission medications   Medication Sig Start Date End Date Taking? Authorizing Provider  acetaminophen (TYLENOL) 500 MG tablet Take 500-1,000 mg by mouth 2 (two) times daily as needed for pain.   Yes Historical Provider, MD  amiodarone (PACERONE) 200 MG tablet Take 200 mg by mouth daily. 12/28/12  Yes Mihai Croitoru, MD  butalbital-acetaminophen-caffeine (FIORICET, ESGIC) 50-325-40 MG per tablet Take 1 tablet by mouth daily as needed for headache.  07/12/12  Yes Historical Provider, MD  calcitRIOL (ROCALTROL) 0.25 MCG capsule Take 0.25 mcg by mouth daily.    Yes Historical Provider, MD  cephALEXin (KEFLEX) 250 MG capsule Take 250 mg by mouth daily.   Yes Historical Provider, MD  dextromethorphan-guaiFENesin (MUCINEX DM) 30-600 MG per 12 hr tablet Take 1 tablet by mouth 2 (two) times daily.   Yes Historical Provider, MD  diphenhydrAMINE (BENADRYL) 25 MG tablet Take 25 mg by mouth 3 (three) times daily as needed for itching.    Yes Historical Provider, MD  docusate sodium (COLACE) 100 MG capsule Take 200 mg by mouth daily.   Yes Historical Provider, MD  ezetimibe-simvastatin (VYTORIN) 10-20 MG per tablet Take 1 tablet by mouth daily.   Yes Historical Provider,  MD  furosemide (LASIX) 40 MG tablet Alternate, taking 40 mg (one tablet) every other day and 80 mg (two tablets) the other days. 06/18/13  Yes Mihai Croitoru, MD  HYDROcodone-acetaminophen (NORCO/VICODIN) 5-325 MG per tablet 1/2 to 1 tablet q 4-6 hours prn pain 07/14/13  Yes Hayden Rasmussen, NP  insulin glargine (LANTUS) 100 UNIT/ML injection Inject 12 Units into the skin every morning.    Yes Historical Provider, MD  levothyroxine (SYNTHROID) 50 MCG tablet Take 1 tablet (50 mcg total) by mouth daily before breakfast. 04/23/13  Yes Mihai Croitoru, MD  NOVOLOG FLEXPEN 100 UNIT/ML injection Inject 0-10 Units into the skin 3 (three) times daily before meals. Sliding scale 06/18/12  Yes Historical Provider, MD  potassium chloride (K-DUR) 10 MEQ tablet Take 10-20 mEq by mouth daily. Alternate, taking 10 mEq (one tablet) every other day and 20 mEq (two tablets) the other days.   Yes Historical Provider, MD  potassium chloride (K-DUR,KLOR-CON) 10 MEQ tablet  06/09/13  Yes Historical Provider, MD  Probiotic Product (ALIGN) 4 MG CAPS Take 1 capsule by mouth daily.   Yes Historical Provider, MD  RENVELA 800 MG tablet Take 1 tablet by mouth 3 (three) times daily. 07/04/13  Yes Historical Provider, MD  sennosides-docusate sodium (SENOKOT-S) 8.6-50 MG tablet Take 1 tablet by mouth daily.   Yes Historical Provider, MD  warfarin (COUMADIN) 5 MG tablet Take 5-7.5 mg by mouth daily. 5 mg  on Mondays, Wednesdays, and Fridays. On other days, take 7.5 mg (one tablet).   Yes Historical Provider, MD  zolpidem (AMBIEN) 5 MG tablet Take 5 mg by mouth at bedtime. 12/03/12  Yes Mihai Croitoru, MD    Scheduled Meds: . amiodarone  200 mg Oral Daily  . calcitRIOL  0.25 mcg Oral Daily  . cephALEXin  250 mg Oral Daily  . docusate sodium  200 mg Oral Daily  . ezetimibe-simvastatin  1 tablet Oral Daily  . furosemide  40 mg Oral QODAY  . insulin aspart  0-5 Units Subcutaneous QHS  . insulin aspart  0-9 Units Subcutaneous TID WC  . insulin  glargine  8 Units Subcutaneous q morning - 10a  . levothyroxine  50 mcg Oral QAC breakfast  . nystatin-triamcinolone   Topical BID  . sevelamer carbonate  800 mg Oral TID WC  . sodium chloride  3 mL Intravenous Q12H  . Warfarin - Pharmacist Dosing Inpatient   Does not apply q1800  . zolpidem  5 mg Oral QHS   Continuous Infusions:  PRN Meds:.butalbital-acetaminophen-caffeine, diphenhydrAMINE, HYDROcodone-acetaminophen, ondansetron (ZOFRAN) IV, ondansetron, senna-docusate, traMADol  Allergies as of 08/03/2013 - Review Complete 08/03/2013  Allergen Reaction Noted  . Floxin [ofloxacin] Other (See Comments) 06/08/2011  . Sulfa antibiotics Other (See Comments) 06/08/2011  . Darvocet [propoxyphene n-acetaminophen] Rash 06/08/2011  . Penicillins Rash 06/08/2011    History reviewed. No pertinent family history.  History   Social History  . Marital Status: Widowed    Spouse Name: N/A    Number of  Children: N/A  . Years of Education: N/A   Occupational History  . Not on file.   Social History Main Topics  . Smoking status: Former Smoker -- 1.00 packs/day for 30 years    Types: Cigarettes    Quit date: 06/05/1980  . Smokeless tobacco: Never Used  . Alcohol Use: No  . Drug Use: No  . Sexual Activity: Not on file   Other Topics Concern  . Not on file   Social History Narrative   Lives with daughter Nada BoozerLaura Parker who is NP for [employer]    Review of Systems: All negative except as stated above in HPI.  Physical Exam: Vital signs: Filed Vitals:   08/06/13 1430  BP: 115/65  Pulse: 63  Temp: 97.9 F (36.6 C)  Resp: 18   Last BM Date: 08/05/13 General:   Elderly, frail, Well-developed, well-nourished, pleasant and cooperative in NAD Lungs:  Clear throughout to auscultation.   No wheezes, crackles, or rhonchi. No acute distress. Heart:  Regular rate and rhythm; no murmurs, clicks, rubs,  or gallops. Abdomen: soft, NT, ND, +BS  Rectal:  Deferred Ext:  no edema  GI:  Lab Results:  Recent Labs  08/04/13 0118 08/05/13 0510 08/06/13 0550  WBC 5.7 8.2 7.0  HGB 5.4* 8.4* 6.6*  HCT 16.3* 25.0* 19.4*  PLT 175 189 162   BMET  Recent Labs  08/04/13 0118 08/05/13 0510 08/06/13 0550  NA 135* 143 139  K 4.0 3.8 4.5  CL 99 104 104  CO2 24 22 23   GLUCOSE 156* 152* 152*  BUN 116* 109* 107*  CREATININE 3.22* 2.76* 2.60*  CALCIUM 9.7 9.7 9.2   LFT  Recent Labs  08/04/13 0118  PROT 5.2*  ALBUMIN 2.8*  AST 17  ALT 12  ALKPHOS 58  BILITOT <0.2*   PT/INR  Recent Labs  08/05/13 0510 08/06/13 0550  LABPROT 14.4 13.8  INR 1.14 1.08     Studies/Results: Ct Abdomen Pelvis Wo Contrast  08/06/2013   CLINICAL DATA:  Decreasing hemoglobin. Possible retroperitoneal hemorrhage.  EXAM: CT ABDOMEN AND PELVIS WITHOUT CONTRAST  TECHNIQUE: Multidetector CT imaging of the abdomen and pelvis was performed following the standard protocol without intravenous contrast.  COMPARISON:  [facility] CT ABDOMEN AND PELVIS WITHOUT CONTRAST from 09/01/2005.  FINDINGS: No focal abnormality is seen in the liver or spleen on this study performed without intravenous contrast material. Scattered areas of calcification along the portal triads of the liver are presumably related to hepatic arterial atherosclerosis. Choledocholithiasis would be considered less likely although this is a possibility. There is no overt intra or extrahepatic biliary duct dilatation.  The stomach, duodenum, and adrenal glands are unremarkable. 8 mm low-density lesion is identified in the head of the pancreas. There is no pancreatic ductal dilatation. Gallbladder is surgically absent.  Both kidneys appear atrophic and have multiple mixed density cortical lesions. 2.3 cm exophytic lesion in the upper pole of the right kidney was 2.2 cm previously. 1.4 cm low-density lesion in the lower pole of the right kidney was 1.2 cm previously. Multiple lesions in the left kidney are  also not substantially changed. One of these in the lower pole (image 36) measures 2.2 cm today compared to 2.2 cm previously.  Dense atherosclerotic calcification is seen in the wall of the abdominal aorta without aneurysm. There is no free fluid or lymphadenopathy in the abdomen.  Imaging through the pelvis shows no free intraperitoneal fluid. There is no pelvic sidewall  lymphadenopathy. Bladder is unremarkable. Uterus is surgically absent. There is no adnexal mass.  Advanced diverticular disease is seen in the sigmoid colon without evidence for diverticulitis. The terminal ileum is normal  Small umbilical hernia contains only fat, as before. Areas of skin thickening in the lower anterior abdominal wall bilaterally are stable.  Bone windows reveal no worrisome lytic or sclerotic osseous lesions.  IMPRESSION: Stable exam. No acute findings in the abdomen or pelvis. Specifically, no evidence for retroperitoneal hemorrhage.  Areas of calcifications scattered along the portal triads of the liver have progressed in the interval. This is presumably secondary to atherosclerosis in the hepatic arteries. Choledocholithiasis is considered much less likely since some of these areas of mineralization were present previously and there is no appreciable intra or extrahepatic biliary duct dilatation.  Bilateral renal cysts without substantial change.  Left colonic diverticulosis without features of diverticulitis.   Electronically Signed   By: Kennith Center M.D.   On: 08/06/2013 11:15    Impression/Plan: 80 yo with anemia without overt bleeding prior to admit although had rectal bleeding today per her daughter. Yesterday's Hgb may have been a lab error because a 2 gram drop without overt bleeding even with dilutional effect is difficult to explain. CT negative for retroperitoneal bleed. With her multiple comorbidities and advanced age I would favor conservative management and not do any invasive GI procedures at this time.  Agree with transfusion. Supportive care. Will follow.    LOS: 3 days   Naquisha Whitehair C.  08/06/2013, 3:11 PM

## 2013-08-06 NOTE — Progress Notes (Signed)
Inpatient Diabetes Program Recommendations  AACE/ADA: New Consensus Statement on Inpatient Glycemic Control (2013)  Target Ranges:  Prepandial:   less than 140 mg/dL      Peak postprandial:   less than 180 mg/dL (1-2 hours)      Critically ill patients:  140 - 180 mg/dL     Results for Lindsay AlanisNGOLD, Yazmyn (MRN [id number]) as of 08/06/2013 12:48  Ref. Range 08/05/2013 07:52 08/05/2013 11:52 08/05/2013 16:38 08/05/2013 21:19  Glucose-Capillary Latest Range: 70-99 mg/dL 962149 (H) 952199 (H) 841225 (H) 177 (H)    Results for Lindsay AlanisNGOLD, [name] (MRN [id number]) as of 08/06/2013 12:48  Ref. Range 08/06/2013 07:44 08/06/2013 11:05  Glucose-Capillary Latest Range: 70-99 mg/dL 253161 (H) 664240 (H)     **MD- Please consider increasing Novolog SSI to Moderate scale tid ac + HS (currently ordered as Sensitive scale)   Will follow. Ambrose FinlandJeannine Johnston Jamyia Fortune RN, MSN, CDE Diabetes Coordinator Inpatient Diabetes Program Team Pager: [phone] (8a-10p)

## 2013-08-06 NOTE — Progress Notes (Signed)
IV site infiltrated before blood was connected to patient.  IV team paged twice.  Blood returned to blood bank.  Await IV site insertion to transfuse.  MD notified.

## 2013-08-06 NOTE — Progress Notes (Signed)
Pt refusing SCDs, daughter in room at bedside. Pt educated on importance of anticoagulation especially with coumadin being held. Pt continues to refuse. Will continue to monitor.

## 2013-08-06 NOTE — Progress Notes (Signed)
CRITICAL VALUE ALERT  Critical value received:  Hemoglobin6.6.6   Date of notification:  08/06/13  Time of notification:  0730  Critical value read back:yes  Nurse who received alert:  Leta BaptistNikki M  MD notified (1st page):  Tat,D  Time of first page:  0802  MD notified (2nd page):  Time of second page:  Responding MD:   Tat,D  Time MD responded:  [phone]

## 2013-08-06 NOTE — Progress Notes (Signed)
[facility] PROGRESS NOTE  Lindsay AlanisMartha Parker ZOX:[id number] DOB: 03-05-1925 DOA: 08/03/2013 PCP: Ginette OttoSTONEKING,HAL THOMAS, MD  Assessment/Plan: Symptomatic anemia  -no overt sign of bleed--concerned about retroperitoneal hematoma -Hemoglobin was found to be as low as 5.4, given 2 units PRBCs  -hemoglobin 8.4-->6.6 on 08/06/13 -transfuse additional 2 units PRBCs -Fecal occult currently pending  -Anemia panel: Iron 132 TIBC 252, Ferritin 701  -consulted GI Atrial fibrillation with supratherapeutic INR  -INR was 4.37, patient was given Vitamin K  -INR currently 1.08 -Coumadin per pharmacy--on hold presently until anemia workup finished -Atrial fibrillation currently rate controlled with amiodarone  Aortic stenosis, mitral regurgitation  -Stable, on telemetry monitoring  Chronic UTI  -Continue Keflex prophylaxis Diabetes mellitus  -Continue Lantus, insulin sliding scale, CBG monitoring  -Patient currently on clear liquid diet  -check A1c History of chronic diastolic heart failure  -Continue Lasix  -Will place patient on fluid restriction, monitor her daily weights as well as in output  Hypothyroidism  -Continue Synthroid  CKD stage V  - serum creatinine 2.94 on 06/02/2013 -GFR 14  -Serum creatinine improved to 2.60 -Patient is adamant about not having dialysis.  -Will need outpatient nephrology followup for possible continued aranesp injections.  -Will continue to monitor  Generalized weakness  -Will consult PT for eval and treatment.  -Likely secondary to anemia.  Code Status: DO NOT RESUSCITATE  Family Communication: None at bedside. Discussed patient with daughter via phone.  Disposition Plan: Will consult PT. Marland Kitchen.          Procedures/Studies: Dg Chest 2 View  08/03/2013   CLINICAL DATA:  Fifty-eight.  Generalized weakness.  EXAM: CHEST  2 VIEW  COMPARISON:  03/18/2012  FINDINGS: Cardiac silhouette is normal in size. Normal mediastinal and hilar contours. Left anterior  chest wall sequential pacemaker is stable in well positioned.  Clear lungs.  No pleural effusion pneumothorax.  There are old right-sided rib fractures. The bony thorax is demineralized.  IMPRESSION: No acute cardiopulmonary disease.   Electronically Signed   By: Amie Portlandavid  Ormond M.D.   On: 08/03/2013 21:30         Subjective: Patient denies any dizziness, chest discomfort, nausea, vomiting, diarrhea, hematochezia, melena, dysuria, hematuria, abdominal pain. Denies any fevers, chills. She has chronic shortness of breath. This is unchanged.  Objective: Filed Vitals:   08/05/13 1734 08/05/13 2121 08/06/13 0458 08/06/13 0749  BP: 123/43 135/55 103/46 111/44  Pulse: 58 62 68 63  Temp: 97.9 F (36.6 C) 97.9 F (36.6 C) 97.4 F (36.3 C) 97.9 F (36.6 C)  TempSrc: Oral Oral Oral Oral  Resp: 20 18 18 18   Height:      Weight:  58.1 kg (128 lb 1.4 oz)    SpO2: 100% 100% 100% 99%    Intake/Output Summary (Last 24 hours) at 08/06/13 0851 Last data filed at 08/06/13 0849  Gross per 24 hour  Intake    680 ml  Output      0 ml  Net    680 ml   Weight change: -2.1 kg (-4 lb 10.1 oz) Exam:   General:  Pt is alert, follows commands appropriately, not in acute distress  HEENT: No icterus, No thrush, /AT  Cardiovascular: RRR, S1/S2, no rubs, no gallops  Respiratory: CTA bilaterally, no wheezing, no crackles, no rhonchi  Abdomen: Soft/+BS, non tender, non distended, no guarding; no CVA tender  Extremities: trace LE edema, No lymphangitis, No petechiae, No rashes, no synovitis  Data Reviewed: Basic Metabolic Panel:  Recent Labs Lab  08/03/13 2029 08/03/13 2136 08/04/13 0118 08/05/13 0510 08/06/13 0550  NA 134* 136* 135* 143 139  K 4.3 4.4 4.0 3.8 4.5  CL 100 97 99 104 104  CO2  --  24 24 22 23   GLUCOSE 153* 132* 156* 152* 152*  BUN 123* 111* 116* 109* 107*  CREATININE 3.30* 3.28* 3.22* 2.76* 2.60*  CALCIUM  --  9.7 9.7 9.7 9.2   Liver Function Tests:  Recent Labs Lab  08/03/13 2136 08/04/13 0118  AST 19 17  ALT 14 12  ALKPHOS 66 58  BILITOT <0.2* <0.2*  PROT 5.8* 5.2*  ALBUMIN 3.0* 2.8*   No results found for this basename: LIPASE, AMYLASE,  in the last 168 hours No results found for this basename: AMMONIA,  in the last 168 hours CBC:  Recent Labs Lab 08/03/13 2010 08/03/13 2029 08/04/13 0118 08/05/13 0510 08/06/13 0550  WBC 5.4  --  5.7 8.2 7.0  NEUTROABS 3.7  --  3.6  --   --   HGB 6.7* 6.5* 5.4* 8.4* 6.6*  HCT 19.9* 19.0* 16.3* 25.0* 19.4*  MCV 97.5  --  99.4 91.9 93.7  PLT 215  --  175 189 162   Cardiac Enzymes: No results found for this basename: CKTOTAL, CKMB, CKMBINDEX, TROPONINI,  in the last 168 hours BNP: No components found with this basename: POCBNP,  CBG:  Recent Labs Lab 08/05/13 0752 08/05/13 1152 08/05/13 1638 08/05/13 2119 08/06/13 0744  GLUCAP 149* 199* 225* 177* 161*    No results found for this or any previous visit (from the past 240 hour(s)).   Scheduled Meds: . amiodarone  200 mg Oral Daily  . calcitRIOL  0.25 mcg Oral Daily  . cephALEXin  250 mg Oral Daily  . docusate sodium  200 mg Oral Daily  . ezetimibe-simvastatin  1 tablet Oral Daily  . furosemide  40 mg Oral QODAY  . insulin aspart  0-5 Units Subcutaneous QHS  . insulin aspart  0-9 Units Subcutaneous TID WC  . insulin glargine  8 Units Subcutaneous q morning - 10a  . levothyroxine  50 mcg Oral QAC breakfast  . nystatin-triamcinolone   Topical BID  . sevelamer carbonate  800 mg Oral TID WC  . sodium chloride  3 mL Intravenous Q12H  . Warfarin - Pharmacist Dosing Inpatient   Does not apply q1800  . zolpidem  5 mg Oral QHS   Continuous Infusions:    Lindsay Stormont, DO  [facility] Pager [phone]  If 7PM-7AM, please contact night-coverage [email or web address] Password [email or web address] 08/06/2013, 8:51 AM   LOS: 3 days

## 2013-08-07 DIAGNOSIS — G9340 Encephalopathy, unspecified: Secondary | ICD-10-CM

## 2013-08-07 LAB — TYPE AND SCREEN
ABO/RH(D): O POS
ANTIBODY SCREEN: NEGATIVE
UNIT DIVISION: 0
Unit division: 0
Unit division: 0
Unit division: 0
Unit division: 0

## 2013-08-07 LAB — CBC
HEMATOCRIT: 27.9 % — AB (ref 36.0–46.0)
HEMOGLOBIN: 9.5 g/dL — AB (ref 12.0–15.0)
MCH: 30.9 pg (ref 26.0–34.0)
MCHC: 34.1 g/dL (ref 30.0–36.0)
MCV: 90.9 fL (ref 78.0–100.0)
Platelets: 183 10*3/uL (ref 150–400)
RBC: 3.07 MIL/uL — ABNORMAL LOW (ref 3.87–5.11)
RDW: 21.3 % — AB (ref 11.5–15.5)
WBC: 11 10*3/uL — ABNORMAL HIGH (ref 4.0–10.5)

## 2013-08-07 LAB — BASIC METABOLIC PANEL
BUN: 106 mg/dL — AB (ref 6–23)
CO2: 22 mEq/L (ref 19–32)
CREATININE: 2.58 mg/dL — AB (ref 0.50–1.10)
Calcium: 9.5 mg/dL (ref 8.4–10.5)
Chloride: 103 mEq/L (ref 96–112)
GFR, EST AFRICAN AMERICAN: 18 mL/min — AB (ref >90)
GFR, EST NON AFRICAN AMERICAN: 16 mL/min — AB (ref >90)
GLUCOSE: 169 mg/dL — AB (ref 70–99)
Potassium: 4.4 mEq/L (ref 3.7–5.3)
Sodium: 139 mEq/L (ref 137–147)

## 2013-08-07 LAB — GLUCOSE, CAPILLARY
Glucose-Capillary: 153 mg/dL — ABNORMAL HIGH (ref 70–99)
Glucose-Capillary: 247 mg/dL — ABNORMAL HIGH (ref 70–99)
Glucose-Capillary: 210 mg/dL — ABNORMAL HIGH (ref 70–99)

## 2013-08-07 LAB — PROTIME-INR
INR: 1.11 (ref 0.00–1.49)
Prothrombin Time: 14.1 seconds (ref 11.6–15.2)

## 2013-08-07 LAB — HEMOGLOBIN A1C
Hgb A1c MFr Bld: 5.7 % — ABNORMAL HIGH (ref <5.7)
Mean Plasma Glucose: 117 mg/dL — ABNORMAL HIGH (ref <117)

## 2013-08-07 LAB — VITAMIN B12: Vitamin B-12: 646 pg/mL (ref 211–911)

## 2013-08-07 NOTE — Progress Notes (Signed)
[facility] PROGRESS NOTE  Lindsay AlanisMartha Upshaw MWN:[id number]:[id number] DOB: 11-16-1924 DOA: 08/03/2013 PCP: Ginette OttoSTONEKING,HAL THOMAS, MD  Assessment/Plan: Symptomatic anemia  -no overt sign of bleed--concerned about retroperitoneal hematoma  -Hemoglobin was found to be as low as 5.4, given 2 units PRBCs (08/05/13) -hemoglobin 8.4-->6.6 on 08/06/13 -transfuse additional 2 units PRBCs (08/06/13)-->Hgb 9.5 -Fecal occult currently pending  -Anemia panel: Iron 132 TIBC 252, Ferritin 701  -appreciate GI consult Atrial fibrillation with supratherapeutic INR  -INR was 4.37, patient was given Vitamin K  -INR currently 1.08  -Coumadin per pharmacy--on hold presently until anemia workup finished  -Atrial fibrillation currently rate controlled with amiodarone  -discussed with pt's daughter-->hold coumadin today.  If Hgb remains stable, restart coumadin 08/08/13 Acute encephalopathy -due to combination of benadryl and ambien -d/c ambien and benadryl -UA and urine culture Aortic stenosis, mitral regurgitation  -Stable, on telemetry monitoring  Chronic UTI  -Continue Keflex prophylaxis  Diabetes mellitus  -Continue Lantus, insulin sliding scale, CBG monitoring  -Patient currently on clear liquid diet  -check A1c--pending  History of chronic diastolic heart failure  -Continue Lasix  -Will place patient on fluid restriction, monitor her daily weights as well as in output  Hypothyroidism  -Continue Synthroid  CKD stage V  - serum creatinine 2.94 on 06/02/2013  -GFR 14  -Serum creatinine improved to 2.58  -Patient is adamant about not having dialysis.  -Will need outpatient nephrology followup for possible continued aranesp injections.  -Will continue to monitor  Generalized weakness  -Will consult PT for eval and treatment.  -Likely secondary to anemia.  Code Status: DO NOT RESUSCITATE  Family Communication: None at bedside. Discussed patient with daughter via phone.  Disposition Plan: home when  stable           Procedures/Studies: Ct Abdomen Pelvis Wo Contrast  08/06/2013   CLINICAL DATA:  Decreasing hemoglobin. Possible retroperitoneal hemorrhage.  EXAM: CT ABDOMEN AND PELVIS WITHOUT CONTRAST  TECHNIQUE: Multidetector CT imaging of the abdomen and pelvis was performed following the standard protocol without intravenous contrast.  COMPARISON:  [facility] CT ABDOMEN AND PELVIS WITHOUT CONTRAST from 09/01/2005.  FINDINGS: No focal abnormality is seen in the liver or spleen on this study performed without intravenous contrast material. Scattered areas of calcification along the portal triads of the liver are presumably related to hepatic arterial atherosclerosis. Choledocholithiasis would be considered less likely although this is a possibility. There is no overt intra or extrahepatic biliary duct dilatation.  The stomach, duodenum, and adrenal glands are unremarkable. 8 mm low-density lesion is identified in the head of the pancreas. There is no pancreatic ductal dilatation. Gallbladder is surgically absent.  Both kidneys appear atrophic and have multiple mixed density cortical lesions. 2.3 cm exophytic lesion in the upper pole of the right kidney was 2.2 cm previously. 1.4 cm low-density lesion in the lower pole of the right kidney was 1.2 cm previously. Multiple lesions in the left kidney are also not substantially changed. One of these in the lower pole (image 36) measures 2.2 cm today compared to 2.2 cm previously.  Dense atherosclerotic calcification is seen in the wall of the abdominal aorta without aneurysm. There is no free fluid or lymphadenopathy in the abdomen.  Imaging through the pelvis shows no free intraperitoneal fluid. There is no pelvic sidewall lymphadenopathy. Bladder is unremarkable. Uterus is surgically absent. There is no adnexal mass.  Advanced diverticular disease is seen in the sigmoid colon without evidence for diverticulitis. The terminal ileum is  normal  Small  umbilical hernia contains only fat, as before. Areas of skin thickening in the lower anterior abdominal wall bilaterally are stable.  Bone windows reveal no worrisome lytic or sclerotic osseous lesions.  IMPRESSION: Stable exam. No acute findings in the abdomen or pelvis. Specifically, no evidence for retroperitoneal hemorrhage.  Areas of calcifications scattered along the portal triads of the liver have progressed in the interval. This is presumably secondary to atherosclerosis in the hepatic arteries. Choledocholithiasis is considered much less likely since some of these areas of mineralization were present previously and there is no appreciable intra or extrahepatic biliary duct dilatation.  Bilateral renal cysts without substantial change.  Left colonic diverticulosis without features of diverticulitis.   Electronically Signed   By: Kennith Center M.D.   On: 08/06/2013 11:15   Dg Chest 2 View  08/03/2013   CLINICAL DATA:  Fifty-[age].  Generalized weakness.  EXAM: CHEST  2 VIEW  COMPARISON:  03/18/2012  FINDINGS: Cardiac silhouette is normal in size. Normal mediastinal and hilar contours. Left anterior chest wall sequential pacemaker is stable in well positioned.  Clear lungs.  No pleural effusion pneumothorax.  There are old right-sided rib fractures. The bony thorax is demineralized.  IMPRESSION: No acute cardiopulmonary disease.   Electronically Signed   By: Amie Portland M.D.   On: 08/03/2013 21:30         Subjective: Patient was a little more confused this morning and agitated. She is alert and answering questions. She denies any fevers, chills, headache, chest pain, shortness breath, nausea, vomiting, abdominal pain. No diarrhea.  Objective: Filed Vitals:   08/07/13 0247 08/07/13 0345 08/07/13 0409 08/07/13 1000  BP: 123/40 118/68 129/38 132/55  Pulse: 65 61 61 59  Temp: 98 F (36.7 C) 98.3 F (36.8 C) 98.9 F (37.2 C) 98.6 F (37 C)  TempSrc: Oral Oral Oral Oral  Resp:  18 18 18 18   Height:      Weight:      SpO2: 98% 100% 100% 98%    Intake/Output Summary (Last 24 hours) at 08/07/13 1210 Last data filed at 08/07/13 0900  Gross per 24 hour  Intake   1010 ml  Output      0 ml  Net   1010 ml   Weight change:  Exam:   General:  Pt is alert, follows commands appropriately, not in acute distress, mildly agitated with questions  HEENT: No icterus, No thrush, No meningismus, [place]/AT  Cardiovascular: RRR, S1/S2, no rubs, no gallops  Respiratory: CTA bilaterally, no wheezing, no crackles, no rhonchi  Abdomen: Soft/+BS, non tender, non distended, no guarding  Extremities: No edema, No lymphangitis, No petechiae, No rashes, no synovitis  Data Reviewed: Basic Metabolic Panel:  Recent Labs Lab 08/03/13 2136 08/04/13 0118 08/05/13 0510 08/06/13 0550 08/07/13 0730  NA 136* 135* 143 139 139  K 4.4 4.0 3.8 4.5 4.4  CL 97 99 104 104 103  CO2 24 24 22 23 22   GLUCOSE 132* 156* 152* 152* 169*  BUN 111* 116* 109* 107* 106*  CREATININE 3.28* 3.22* 2.76* 2.60* 2.58*  CALCIUM 9.7 9.7 9.7 9.2 9.5   Liver Function Tests:  Recent Labs Lab 08/03/13 2136 08/04/13 0118  AST 19 17  ALT 14 12  ALKPHOS 66 58  BILITOT <0.2* <0.2*  PROT 5.8* 5.2*  ALBUMIN 3.0* 2.8*   No results found for this basename: LIPASE, AMYLASE,  in the last 168 hours No results found for this basename: AMMONIA,  in the last 168 hours  CBC:  Recent Labs Lab 08/03/13 2010 08/03/13 2029 08/04/13 0118 08/05/13 0510 08/06/13 0550 08/07/13 0730  WBC 5.4  --  5.7 8.2 7.0 11.0*  NEUTROABS 3.7  --  3.6  --   --   --   HGB 6.7* 6.5* 5.4* 8.4* 6.6* 9.5*  HCT 19.9* 19.0* 16.3* 25.0* 19.4* 27.9*  MCV 97.5  --  99.4 91.9 93.7 90.9  PLT 215  --  175 189 162 183   Cardiac Enzymes: No results found for this basename: CKTOTAL, CKMB, CKMBINDEX, TROPONINI,  in the last 168 hours BNP: No components found with this basename: POCBNP,  CBG:  Recent Labs Lab 08/06/13 0744  08/06/13 1105 08/06/13 1706 08/06/13 2148 08/07/13 0822  GLUCAP 161* 240* 173* 136* 153*    No results found for this or any previous visit (from the past 240 hour(s)).   Scheduled Meds: . amiodarone  200 mg Oral Daily  . calcitRIOL  0.25 mcg Oral Daily  . cephALEXin  250 mg Oral Daily  . docusate sodium  200 mg Oral Daily  . ezetimibe-simvastatin  1 tablet Oral Daily  . furosemide  40 mg Oral QODAY  . insulin aspart  0-5 Units Subcutaneous QHS  . insulin aspart  0-9 Units Subcutaneous TID WC  . insulin glargine  8 Units Subcutaneous q morning - 10a  . levothyroxine  50 mcg Oral QAC breakfast  . nystatin-triamcinolone   Topical BID  . sevelamer carbonate  800 mg Oral TID WC  . sodium chloride  3 mL Intravenous Q12H   Continuous Infusions:    Elai Vanwyk, DO  [facility] Pager [phone]  If 7PM-7AM, please contact night-coverage [email or web address] Password [email or web address] 08/07/2013, 12:10 PM   LOS: 4 days

## 2013-08-07 NOTE — Progress Notes (Signed)
Physical Therapy Treatment Patient Details Name: [name] Sheckler MRN: [id number] DOB: Oct 12, 1924 Today's Date: 08/07/2013 Time: [id number] PT Time Calculation (min): 23 min  PT Assessment / Plan / Recommendation  History of Present Illness [name] Slotnick is a 80 y.o. female with Past medical history of chronic kidney disease, anemia of chronic kidney disease, severe aortic stenosis, moderate MR, complete heart block status post pacemaker, recurrent UTI on Keflex chronically, atrial fibrillation on Coumadin, diabetes mellitus. Admitted s/p generalized weakness, anemia.   PT Comments   Patient able to ambulate in hallway today.  Easily fatigues and needs seated versus standing rests.  Seems motivated due to wanting to go home as soon as possible.  Follow Up Recommendations  Home health PT;Supervision/Assistance - 24 hour     Does the patient have the potential to tolerate intense rehabilitation   N/A  Barriers to Discharge  None      Equipment Recommendations  None recommended by PT    Recommendations for Other Services  None  Frequency Min 3X/week   Progress towards PT Goals Progress towards PT goals: Progressing toward goals  Plan Current plan remains appropriate    Precautions / Restrictions Precautions Precautions: Fall   Pertinent Vitals/Pain No pain complaints    Mobility  Bed Mobility General bed mobility comments: pt up in chair Transfers Overall transfer level: Needs assistance Equipment used: Rolling walker (2 wheeled) Sit to Stand: Min guard Ambulation/Gait Ambulation/Gait assistance: Min guard;Min assist Ambulation Distance (Feet): 80 Feet (and 60') Assistive device: Rolling walker (2 wheeled) Gait Pattern/deviations: Step-through pattern General Gait Details: assist to keep walker straight and from running into obstacles on left side; improved once hand placed back on grip on walker      PT Goals (current goals can now be found in the care plan section)     Visit Information  Last PT Received On: 08/07/13 Assistance Needed: +1 History of Present Illness: [name] Carranco is a 80 y.o. female with Past medical history of chronic kidney disease, anemia of chronic kidney disease, severe aortic stenosis, moderate MR, complete heart block status post pacemaker, recurrent UTI on Keflex chronically, atrial fibrillation on Coumadin, diabetes mellitus. Admitted s/p generalized weakness, anemia.    Subjective Data      Cognition  Cognition Arousal/Alertness: Awake/alert Behavior During Therapy: WFL for tasks assessed/performed Overall Cognitive Status: Within Functional Limits for tasks assessed    Balance  Balance Overall balance assessment: Needs assistance Standing balance support: Single extremity supported;No upper extremity supported;During functional activity Standing balance-Leahy Scale: Fair Standing balance comment: washing hands at sink, assist for safety supervision only  End of Session PT - End of Session Equipment Utilized During Treatment: Gait belt Activity Tolerance: Patient tolerated treatment well Patient left: in chair;with call bell/phone within reach   GP     [facility] Medical Center North Street CampusWYNN,CYNDI 08/07/2013, 4:29 PM [place]ndi Shivonne Schwartzman, [place] CarolinaPT [phone] 08/07/2013

## 2013-08-07 NOTE — Progress Notes (Signed)
ANTICOAGULATION CONSULT NOTE - Follow Up Consult  Pharmacy Consult for Coumadin Indication: atrial fibrillation  Allergies  Allergen Reactions  . Floxin [Ofloxacin] Other (See Comments)    nightmares  . Sulfa Antibiotics Other (See Comments)    From when younger  . Darvocet [Propoxyphene N-Acetaminophen] Rash  . Penicillins Rash    Patient Measurements: Height: 4\' 11"  (149.9 cm) Weight: 128 lb 1.4 oz (58.1 kg) IBW/kg (Calculated) : 43.2  Vital Signs: Temp: 98.6 F (37 C) (03/05 1000) Temp src: Oral (03/05 1000) BP: 132/55 mmHg (03/05 1000) Pulse Rate: 59 (03/05 1000)  Labs:  Recent Labs  08/05/13 0510 08/06/13 0550 08/07/13 0730  HGB 8.4* 6.6* 9.5*  HCT 25.0* 19.4* 27.9*  PLT 189 162 183  LABPROT 14.4 13.8 14.1  INR 1.14 1.08 1.11  CREATININE 2.76* 2.60* 2.58*    Estimated Creatinine Clearance: 12.2 ml/min (by C-G formula based on Cr of 2.58).   Medications:  Scheduled:  . amiodarone  200 mg Oral Daily  . calcitRIOL  0.25 mcg Oral Daily  . cephALEXin  250 mg Oral Daily  . docusate sodium  200 mg Oral Daily  . ezetimibe-simvastatin  1 tablet Oral Daily  . furosemide  40 mg Oral QODAY  . insulin aspart  0-5 Units Subcutaneous QHS  . insulin aspart  0-9 Units Subcutaneous TID WC  . insulin glargine  8 Units Subcutaneous q morning - 10a  . levothyroxine  50 mcg Oral QAC breakfast  . nystatin-triamcinolone   Topical BID  . sevelamer carbonate  800 mg Oral TID WC  . sodium chloride  3 mL Intravenous Q12H  . Warfarin - Pharmacist Dosing Inpatient   Does not apply q1800  . zolpidem  5 mg Oral QHS   Assessment: 80 yo F presented to ED 3/1 with cc: weakness.  Pt was found to have low Hgb and elevated INR.  Pt has hx of anemia but denied any active bleeding.  Since admission the INR has been reversed and pt has received 4 units PRBC.  GI is not recommending any further testing for source of blood loss.  Dr. Arbutus Leasat to discuss risk vs benefit of restarting Coumadin  with family.  Home Coumadin dose = 7.5mg  daily except 5mg  on MWF  Goal of Therapy:  INR 2-3   Plan:  No Coumadin for now. Follow up plans for long term anticoag.  Continue daily INR. Follow CBC and continue to monitor for bleeding.  Toys 'R' UsKimberly Latoshia Monrroy, Pharm.D., BCPS Clinical Pharmacist Pager [phone] 08/07/2013 10:49 AM

## 2013-08-07 NOTE — Progress Notes (Signed)
Patient ID: [name], female   DOB: Feb 22, 1925, 80 y.o.   MRN: [id number] [facility]  [name] 80 y.o. Feb 22, 1925   Subjective: Sitting in chair. Feels good. No BMs overnight. Hgb 9.5. Sons at bedside.  Objective: Vital signs: Filed Vitals:   08/07/13 1400  BP: 118/62  Pulse: 66  Temp: 98.6 F (37 C)  Resp: 18    Physical Exam: Gen: alert, no acute distress, elderly, frail Abd: soft, nontender  Lab Results:  Recent Labs  08/06/13 0550 08/07/13 0730  NA 139 139  K 4.5 4.4  CL 104 103  CO2 23 22  GLUCOSE 152* 169*  BUN 107* 106*  CREATININE 2.60* 2.58*  CALCIUM 9.2 9.5   No results found for this basename: AST, ALT, ALKPHOS, BILITOT, PROT, ALBUMIN,  in the last 72 hours  Recent Labs  08/06/13 0550 08/07/13 0730  WBC 7.0 11.0*  HGB 6.6* 9.5*  HCT 19.4* 27.9*  MCV 93.7 90.9  PLT 162 183      Assessment/Plan: 80 yo with anemia that has improved following transfusion to 9.5. No overt bleeding. No plans for GI procedures. Will sign off. Call if questions.   Jerrel Tiberio C. 08/07/2013, 3:15 PM

## 2013-08-08 ENCOUNTER — Inpatient Hospital Stay (HOSPITAL_COMMUNITY): Payer: MEDICARE

## 2013-08-08 DIAGNOSIS — G9340 Encephalopathy, unspecified: Secondary | ICD-10-CM

## 2013-08-08 LAB — GLUCOSE, CAPILLARY
Glucose-Capillary: 156 mg/dL — ABNORMAL HIGH (ref 70–99)
Glucose-Capillary: 208 mg/dL — ABNORMAL HIGH (ref 70–99)
Glucose-Capillary: 175 mg/dL — ABNORMAL HIGH (ref 70–99)
Glucose-Capillary: 164 mg/dL — ABNORMAL HIGH (ref 70–99)

## 2013-08-08 LAB — HEPATIC FUNCTION PANEL
ALK PHOS: 67 U/L (ref 39–117)
ALT: 17 U/L (ref 0–35)
AST: 20 U/L (ref 0–37)
Albumin: 2.2 g/dL — ABNORMAL LOW (ref 3.5–5.2)
TOTAL PROTEIN: 5 g/dL — AB (ref 6.0–8.3)
Total Bilirubin: 0.3 mg/dL (ref 0.3–1.2)

## 2013-08-08 LAB — PROTIME-INR
INR: 1.24 (ref 0.00–1.49)
PROTHROMBIN TIME: 15.3 s — AB (ref 11.6–15.2)

## 2013-08-08 LAB — BASIC METABOLIC PANEL
BUN: 108 mg/dL — AB (ref 6–23)
CO2: 21 mEq/L (ref 19–32)
CREATININE: 2.62 mg/dL — AB (ref 0.50–1.10)
Calcium: 9.1 mg/dL (ref 8.4–10.5)
Chloride: 103 mEq/L (ref 96–112)
GFR calc Af Amer: 18 mL/min — ABNORMAL LOW (ref >90)
GFR calc non Af Amer: 15 mL/min — ABNORMAL LOW (ref >90)
Glucose, Bld: 137 mg/dL — ABNORMAL HIGH (ref 70–99)
Potassium: 3.8 mEq/L (ref 3.7–5.3)
Sodium: 138 mEq/L (ref 137–147)

## 2013-08-08 LAB — CBC
HCT: 22.8 % — ABNORMAL LOW (ref 36.0–46.0)
Hemoglobin: 7.6 g/dL — ABNORMAL LOW (ref 12.0–15.0)
MCH: 30.9 pg (ref 26.0–34.0)
MCHC: 33.3 g/dL (ref 30.0–36.0)
MCV: 92.7 fL (ref 78.0–100.0)
Platelets: UNDETERMINED 10*3/uL (ref 150–400)
RBC: 2.46 MIL/uL — ABNORMAL LOW (ref 3.87–5.11)
RDW: 22.2 % — AB (ref 11.5–15.5)
WBC: 7.7 10*3/uL (ref 4.0–10.5)

## 2013-08-08 LAB — FOLATE RBC: RBC Folate: >1000 ng/mL — ABNORMAL HIGH (ref >280)

## 2013-08-08 LAB — HAPTOGLOBIN: Haptoglobin: 104 mg/dL (ref 45–215)

## 2013-08-08 LAB — LACTATE DEHYDROGENASE: LDH: 231 U/L (ref 94–250)

## 2013-08-08 MED ORDER — GUAIFENESIN 100 MG/5ML PO SYRP
200.0000 mg | ORAL_SOLUTION | ORAL | Status: DC | PRN
Start: 2013-08-08 — End: 2013-08-09
  Administered 2013-08-08: 200 mg via ORAL
  Filled 2013-08-08 (×3): qty 10

## 2013-08-08 MED ORDER — SODIUM CHLORIDE 0.9 % IJ SOLN
10.0000 mL | INTRAMUSCULAR | Status: DC | PRN
  Administered 2013-08-09 (×2): 10 mL

## 2013-08-08 NOTE — Progress Notes (Signed)
Peripherally Inserted Central Catheter/Midline Placement  The IV Nurse has discussed with the patient and/or persons authorized to consent for the patient, the purpose of this procedure and the potential benefits and risks involved with this procedure.  The benefits include less needle sticks, lab draws from the catheter and patient may be discharged home with the catheter.  Risks include, but not limited to, infection, bleeding, blood clot (thrombus formation), and puncture of an artery; nerve damage and irregular heat beat.  Alternatives to this procedure were also discussed.  PICC/Midline Placement Documentation  PICC / Midline Single Lumen 08/08/13 PICC Right Basilic 35 cm 1 cm (Active)  Indication for Insertion or Continuance of Line Poor Vasculature-patient has had multiple peripheral attempts or PIVs lasting less than 24 hours 08/08/2013  7:00 PM  Exposed Catheter (cm) 1 cm 08/08/2013  7:00 PM  Dressing Change Due 08/15/13 08/08/2013  7:00 PM       [name], [name] 08/08/2013, 7:08 PM

## 2013-08-08 NOTE — Progress Notes (Signed)
Patient ID: [name], female   DOB: 08/28/1924, [age] y.o.   MRN: [id number] [facility]  [name] 80 y.o. 08/28/1924   Subjective: Sitting in chair. No BMs. Hgb 7.6 (9.5). Family at bedside.  Objective: Vital signs: Filed Vitals:   08/08/13 1142  BP: 125/54  Pulse: 60  Temp: 98.1 F (36.7 C)  Resp: 17    Physical Exam: Gen: elderly, frail, no acute distress  Lab Results:  Recent Labs  08/07/13 0730 08/08/13 0454  NA 139 138  K 4.4 3.8  CL 103 103  CO2 22 21  GLUCOSE 169* 137*  BUN 106* 108*  CREATININE 2.58* 2.62*  CALCIUM 9.5 9.1    Recent Labs  08/08/13 1024  AST 20  ALT 17  ALKPHOS 67  BILITOT 0.3  PROT 5.0*  ALBUMIN 2.2*    Recent Labs  08/07/13 0730 08/08/13 0801  WBC 11.0* 7.7  HGB 9.5* 7.6*  HCT 27.9* 22.8*  MCV 90.9 92.7  PLT 183 PLATELET CLUMPS NOTED ON SMEAR, UNABLE TO ESTIMATE      Assessment/Plan: 80 yo with anemia without overt bleeding. Atypical for drastic drops in Hgb without overt bleeding to be from a GI source. Increased risk for colonoscopy/EGD due to advanced age and multiple comorbidities. Colonoscopy would be more helpful than an EGD and patient is not willing to do a colonoscopy. Family questions whether an EGD can be done. Will reassess Hgb tomorrow and if continues to decline, then will do EGD with anesthesiology to give sedation and cardiology input due to cardiac risk factors. Patient is heme positive but unusual to have marked anemia following transfusions without any overt bleeding. NPO p MN in case EGD is needed.   Amarien Carne C. 08/08/2013, 5:14 PM

## 2013-08-08 NOTE — Progress Notes (Signed)
During the night, patient stated that she needed to take her "sleep medicine."  Despite being told that she did not have any ordered, patient continued to demand it.  Otherwise, patient slept throughout night with son in room.  Will continue to monitor.

## 2013-08-08 NOTE — Progress Notes (Signed)
[facility] PROGRESS NOTE  Raisa Ditto ZOX:[id number] DOB: 12/27/1924 DOA: 08/03/2013 PCP: Ginette Otto, MD  Assessment/Plan: Symptomatic anemia  -no overt sign of bleed--concerned about retroperitoneal hematoma  -Hemoglobin was found to be as low as 5.4, given 2 units PRBCs (08/05/13)  -hemoglobin 8.4-->6.6 on 08/06/13  -transfuse additional 2 units PRBCs (08/06/13)-->Hgb 9.5  -08/08/13 Hgb dropped back to 7.6-->reconsulted GI -check LFTs, LDH, Haptoglobin -Fecal occult positive -Anemia panel: Iron 132 TIBC 252, Ferritin 701  -appreciate GI consult  Atrial fibrillation with supratherapeutic INR  -INR was 4.37, patient was given Vitamin K  -INR currently 1.08  -Coumadin per pharmacy--on hold presently until anemia workup finished  -Atrial fibrillation currently rate controlled with amiodarone  -discussed with pt's daughter-->hold coumadin for now Acute encephalopathy  -improved with stopping ambien and benadryl -due to combination of benadryl and ambien  -d/c ambien and benadryl  Aortic stenosis, mitral regurgitation  -Stable, on telemetry monitoring  Chronic UTI  -Continue Keflex prophylaxis  Diabetes mellitus  -Continue Lantus, insulin sliding scale, CBG monitoring  -check A1c--5.7 History of chronic diastolic heart failure  -Continue Lasix  -Will place patient on fluid restriction, monitor her daily weights as well as in output  Hypothyroidism  -Continue Synthroid  CKD stage V  - serum creatinine 2.94 on 06/02/2013  -GFR 14  -Serum creatinine stable around 2.6 -Patient is adamant about not having dialysis.  -Will need outpatient nephrology followup for possible continued aranesp injections.  -Will continue to monitor  Generalized weakness  -appreciate PT -Likely secondary to anemia.  Code Status: DO NOT RESUSCITATE  Family Communication: None at bedside. Discussed patient with daughter via phone.  Disposition Plan: home when  stable        Procedures/Studies: Ct Abdomen Pelvis Wo Contrast  08/06/2013   CLINICAL DATA:  Decreasing hemoglobin. Possible retroperitoneal hemorrhage.  EXAM: CT ABDOMEN AND PELVIS WITHOUT CONTRAST  TECHNIQUE: Multidetector CT imaging of the abdomen and pelvis was performed following the standard protocol without intravenous contrast.  COMPARISON:  [facility] CT ABDOMEN AND PELVIS WITHOUT CONTRAST from 09/01/2005.  FINDINGS: No focal abnormality is seen in the liver or spleen on this study performed without intravenous contrast material. Scattered areas of calcification along the portal triads of the liver are presumably related to hepatic arterial atherosclerosis. Choledocholithiasis would be considered less likely although this is a possibility. There is no overt intra or extrahepatic biliary duct dilatation.  The stomach, duodenum, and adrenal glands are unremarkable. 8 mm low-density lesion is identified in the head of the pancreas. There is no pancreatic ductal dilatation. Gallbladder is surgically absent.  Both kidneys appear atrophic and have multiple mixed density cortical lesions. 2.3 cm exophytic lesion in the upper pole of the right kidney was 2.2 cm previously. 1.4 cm low-density lesion in the lower pole of the right kidney was 1.2 cm previously. Multiple lesions in the left kidney are also not substantially changed. One of these in the lower pole (image 36) measures 2.2 cm today compared to 2.2 cm previously.  Dense atherosclerotic calcification is seen in the wall of the abdominal aorta without aneurysm. There is no free fluid or lymphadenopathy in the abdomen.  Imaging through the pelvis shows no free intraperitoneal fluid. There is no pelvic sidewall lymphadenopathy. Bladder is unremarkable. Uterus is surgically absent. There is no adnexal mass.  Advanced diverticular disease is seen in the sigmoid colon without evidence for diverticulitis. The terminal ileum is normal   Small umbilical hernia contains only fat, as before.  Areas of skin thickening in the lower anterior abdominal wall bilaterally are stable.  Bone windows reveal no worrisome lytic or sclerotic osseous lesions.  IMPRESSION: Stable exam. No acute findings in the abdomen or pelvis. Specifically, no evidence for retroperitoneal hemorrhage.  Areas of calcifications scattered along the portal triads of the liver have progressed in the interval. This is presumably secondary to atherosclerosis in the hepatic arteries. Choledocholithiasis is considered much less likely since some of these areas of mineralization were present previously and there is no appreciable intra or extrahepatic biliary duct dilatation.  Bilateral renal cysts without substantial change.  Left colonic diverticulosis without features of diverticulitis.   Electronically Signed   By: Kennith CenterEric  Mansell M.D.   On: 08/06/2013 11:15   Dg Chest 2 View  08/03/2013   CLINICAL DATA:  Fifty-eight.  Generalized weakness.  EXAM: CHEST  2 VIEW  COMPARISON:  03/18/2012  FINDINGS: Cardiac silhouette is normal in size. Normal mediastinal and hilar contours. Left anterior chest wall sequential pacemaker is stable in well positioned.  Clear lungs.  No pleural effusion pneumothorax.  There are old right-sided rib fractures. The bony thorax is demineralized.  IMPRESSION: No acute cardiopulmonary disease.   Electronically Signed   By: Amie Portlandavid  Ormond M.D.   On: 08/03/2013 21:30         Subjective: Patient is more alert today. She denies any fevers, chills, dizziness, chest discomfort or shortness of breath, nausea, vomiting, diarrhea. Patient has not had any bowel movements in the last 24 hours.  Objective: Filed Vitals:   08/07/13 2040 08/08/13 0442 08/08/13 0500 08/08/13 0931  BP: 136/47 92/41  93/50  Pulse: 61 60  60  Temp: 98.2 F (36.8 C) 98.8 F (37.1 C)  98.6 F (37 C)  TempSrc: Oral Oral  Oral  Resp: 16 16  16   Height:      Weight:   58.3 kg (128 lb  8.5 oz)   SpO2: 100% 98%  100%    Intake/Output Summary (Last 24 hours) at 08/08/13 0933 Last data filed at 08/07/13 1300  Gross per 24 hour  Intake    120 ml  Output      0 ml  Net    120 ml   Weight change:  Exam:   General:  Pt is alert, follows commands appropriately, not in acute distress  HEENT: No icterus, No thrush,  [place]/AT  Cardiovascular: RRR, S1/S2, no rubs, no gallops  Respiratory: CTA bilaterally, no wheezing, no crackles, no rhonchi  Abdomen: Soft/+BS, non tender, non distended, no guarding  Extremities: No edema, No lymphangitis, No petechiae, No rashes, no synovitis  Data Reviewed: Basic Metabolic Panel:  Recent Labs Lab 08/04/13 0118 08/05/13 0510 08/06/13 0550 08/07/13 0730 08/08/13 0454  NA 135* 143 139 139 138  K 4.0 3.8 4.5 4.4 3.8  CL 99 104 104 103 103  CO2 24 22 23 22 21   GLUCOSE 156* 152* 152* 169* 137*  BUN 116* 109* 107* 106* 108*  CREATININE 3.22* 2.76* 2.60* 2.58* 2.62*  CALCIUM 9.7 9.7 9.2 9.5 9.1   Liver Function Tests:  Recent Labs Lab 08/03/13 2136 08/04/13 0118  AST 19 17  ALT 14 12  ALKPHOS 66 58  BILITOT <0.2* <0.2*  PROT 5.8* 5.2*  ALBUMIN 3.0* 2.8*   No results found for this basename: LIPASE, AMYLASE,  in the last 168 hours No results found for this basename: AMMONIA,  in the last 168 hours CBC:  Recent Labs Lab 08/03/13 2010  08/04/13 0118 08/05/13 0510 08/06/13 0550 08/07/13 0730 08/08/13 0801  WBC 5.4  --  5.7 8.2 7.0 11.0* PENDING  NEUTROABS 3.7  --  3.6  --   --   --   --   HGB 6.7*  < > 5.4* 8.4* 6.6* 9.5* 7.6*  HCT 19.9*  < > 16.3* 25.0* 19.4* 27.9* 22.8*  MCV 97.5  --  99.4 91.9 93.7 90.9 92.7  PLT 215  --  175 189 162 183 PENDING  < > = values in this interval not displayed. Cardiac Enzymes: No results found for this basename: CKTOTAL, CKMB, CKMBINDEX, TROPONINI,  in the last 168 hours BNP: No components found with this basename: POCBNP,  CBG:  Recent Labs Lab 08/06/13 2148  08/07/13 0822 08/07/13 1628 08/07/13 2045 08/08/13 0836  GLUCAP 136* 153* 247* 210* 156*    No results found for this or any previous visit (from the past 240 hour(s)).   Scheduled Meds: . amiodarone  200 mg Oral Daily  . calcitRIOL  0.25 mcg Oral Daily  . cephALEXin  250 mg Oral Daily  . docusate sodium  200 mg Oral Daily  . ezetimibe-simvastatin  1 tablet Oral Daily  . furosemide  40 mg Oral QODAY  . insulin aspart  0-5 Units Subcutaneous QHS  . insulin aspart  0-9 Units Subcutaneous TID WC  . insulin glargine  8 Units Subcutaneous q morning - 10a  . levothyroxine  50 mcg Oral QAC breakfast  . nystatin-triamcinolone   Topical BID  . sevelamer carbonate  800 mg Oral TID WC  . sodium chloride  3 mL Intravenous Q12H   Continuous Infusions:    Saphia Vanderford, DO  [facility] Pager [phone]  If 7PM-7AM, please contact night-coverage [email or web address] Password [email or web address] 08/08/2013, 9:33 AM   LOS: 5 days

## 2013-08-08 NOTE — Progress Notes (Signed)
Patient NW:GNFAOZ:[name]      DOB: 10-Jun-1924      HYQ:[id number]  Call back from patient's daughter. Plan GOC at noon Saturday 3/7.   [name], MD MBA The Palliative Medicine Team at [facility] Team Phone: [phone] Pager: [phone]

## 2013-08-08 NOTE — Progress Notes (Signed)
Received call from Kendal HymenBonnie, RN with IV team: OK to use PICC.

## 2013-08-09 DIAGNOSIS — D62 Acute posthemorrhagic anemia: Secondary | ICD-10-CM

## 2013-08-09 DIAGNOSIS — Z515 Encounter for palliative care: Secondary | ICD-10-CM

## 2013-08-09 LAB — PROTIME-INR
INR: 1.15 (ref 0.00–1.49)
Prothrombin Time: 14.5 s (ref 11.6–15.2)

## 2013-08-09 LAB — CBC
HEMATOCRIT: 19.6 % — AB (ref 36.0–46.0)
HEMOGLOBIN: 6.6 g/dL — AB (ref 12.0–15.0)
MCH: 31.6 pg (ref 26.0–34.0)
MCHC: 33.7 g/dL (ref 30.0–36.0)
MCV: 93.8 fL (ref 78.0–100.0)
Platelets: 172 10*3/uL (ref 150–400)
RBC: 2.09 MIL/uL — ABNORMAL LOW (ref 3.87–5.11)
RDW: 22.7 % — AB (ref 11.5–15.5)
WBC: 8.3 10*3/uL (ref 4.0–10.5)

## 2013-08-09 LAB — PREPARE RBC (CROSSMATCH)

## 2013-08-09 LAB — GLUCOSE, CAPILLARY
GLUCOSE-CAPILLARY: 136 mg/dL — AB (ref 70–99)
Glucose-Capillary: 152 mg/dL — ABNORMAL HIGH (ref 70–99)

## 2013-08-09 MED ORDER — GUAIFENESIN 100 MG/5ML PO SYRP: 200.0000 mg | ORAL_SOLUTION | ORAL | Status: AC | PRN

## 2013-08-09 MED ORDER — HYDROCODONE-ACETAMINOPHEN 5-325 MG PO TABS: ORAL_TABLET | ORAL | Status: DC

## 2013-08-09 MED ORDER — HYDROCODONE-ACETAMINOPHEN 5-325 MG PO TABS: ORAL_TABLET | ORAL | Status: AC

## 2013-08-09 MED ORDER — ZOLPIDEM TARTRATE 5 MG PO TABS: 5.0000 mg | ORAL_TABLET | Freq: Every evening | ORAL | Status: DC | PRN

## 2013-08-09 NOTE — Progress Notes (Signed)
   CARE MANAGEMENT NOTE 08/09/2013  Patient:  [name], [name]   Account Number:  [id number]  Date Initiated:  08/04/2013  Documentation initiated by:  Lizabeth Leyden  Subjective/Objective Assessment:   admitted with anemia, Hgb 6.5, AKI, INR 5.2     Action/Plan:   progression of care and discharge planning   Anticipated DC Date:  08/10/2013   Anticipated DC Plan:  [place]  CM consult      PAC Choice  HOSPICE   Choice offered to / List presented to:  C-4 Adult Children           HH agency  [facility] [place]   Status of service:  Completed, signed off Medicare Important Message given?   (If response is "NO", the following Medicare IM given date fields will be blank) Date Medicare IM given:   Date Additional Medicare IM given:    Discharge Disposition:  [place]  Per UR Regulation:    If discussed at Long Length of Stay Meetings, dates discussed:    Comments:  08/09/13 14:00 CM received call from MD to arrange Hospice with discharge today.  CM met with the family and pt in room and explained we would do our best but could not promise today.  Pt and family verbalized understanding. Pt's daughter is Mickel Baas, a cardiology NP, [phone] and will be pt's contact.  Mickel Baas was given list and chose [facility].  Mickel Baas states no DME is required.  Pt has PICC and MD states she will be going home with PICC for symptom management.  CM called [facility] and spoke with Sonia Baller who requested I fax facesheet with Contact Laura's number, Notes, H&P (DC summary not addressed as of yet).  All requested info faxed and CM received call from Liliane Channel of [facility] who had already made contact with Daughter Mickel Baas.  No other CM needs were communicated at this time. Mariane Masters, BSN, CM [phone].

## 2013-08-09 NOTE — Progress Notes (Signed)
Patient ID: Yvonna AlanisMartha Surgeon, female   DOB: 1925-01-01, 80 y.o.   MRN: [id number]  Called by Dr. Arbutus Leasat yesterday evening that family does not want to pursue any GI procedures and review of chart shows that palliative care evaluation is desired and planned. Will sign off. Call if questions.

## 2013-08-09 NOTE — Consult Note (Signed)
Patient BF:[name]      DOB: [birth year]      BVP:[id number]  Summary of Goals of care; full note to follow:  Met with Patient who has full capacity for decision making, her daughter [name] who works in Market researcher, her two sons [name] and [name], and [name] children and wife [name].  [name] states that she would prefer to go home and not undergo further testing or treatment for her current conditions.  She realizes that she is not getting better and she just wants to spend time with her family and cats and watch TV.  Most of all she wants a good nights sleep.  She relates that she does not want further transfusions as she feels that this is wasting blood product since it goes away over night.  She would like to continue to monitor her blood sugars and take her other chronic disease medications.  She does not want to take her coumadin further and understands the risks for stroke.  She has not been eating well and does not want to be forced to eat.   Recommend: 1.  DNR  2.  Comfort feeding.  3.  General pain control with hydrocodone.  Hospice can assist with Roxanol later as symptoms warrant  4.  No further coumadin. Patient understands the risks  5.  Oral antibiotics for comfort only as needed  6.  No feeding tubes or artificial hydration  MOST and GOld DnR completed.   Care manager contacted to arrange Hospice at home. Dr. Carles Collet updated.   [name], MD MBA The Palliative Medicine Team at [facility] Phone: [phone] Pager: [phone]  1245-100 pm plus 30 min coordinating care.

## 2013-08-09 NOTE — Consult Note (Signed)
Patient WU:JWJXBJ Somera      DOB: 1925/05/23      YNW:[id number]     Consult Note from the Palliative Medicine Team at [facility]    Consult Requested by: Dr. Arbutus Leas    PCP: Ginette Otto, MD Reason for Consultation: GOC     Phone Number:[phone]  symptom management Assessment of patients Current state: 80 yr old white female with multiple medical issues including chronic renal disease, chronic anemia.  The patient states that she would like to go home with hospice care and watch TV with her cats.  She reports that she no longer wants transfusions as she sees it as a Pharmacist, community.  Her daughter is an NP and her two son's are present and support in these decisions.  She would like to go home today if possible.  Patient relates the only this that she would like to have is her ambien back as she can't sleep.  MoST form was completed indicating comfort care, antibiotics orally only if needed and should be discussed , no feeding tubes or IVF to prolong life.   Goals of Care: 1.  Code Status: DNR   2. Scope of Treatment: Continue supportive cardiac medications for comfort, no further transfusions or labs, cough medicine for loose cough.  Warfarin will be discontinued.   4. Disposition:  Home with hospice   3. Symptom Management:   1. Anxiety/Agitation/insomnia: ambien for sleep 2. Pain: hydrocodone home dose to continue  4. Psychosocial:  Patient was a Engineer, civil (consulting),  She lives with her daughter who is also a Engineer, civil (consulting), she has two sons.  Her grandson described her as a "bad a...."  5. Spiritual: Family will see outside support with hospice        Patient Documents Completed or Given: Document Given Completed  Advanced Directives Pkt    MOST x x  DNR  x  Gone from My Sight    Hard Choices      Brief HPI: 80 yr old white female with known afib, severe valvular disease, chronic anemia which is likely multifactorial, diastolic heart failure, stage V CKD.  Patient is now transfusion  dependent.  We were asked to assist with goals of care.   ROS: cough, decreased appetite, not sleeping without her pills.    PMH:  Past Medical History  Diagnosis Date  . Diabetes mellitus, insulin dependent (IDDM), controlled   . Chronic kidney disease, stage 4, severely decreased GFR     likely related to diabetic and hypertensive nephropathy, no evidence of renal artery stenosis by duplex US in 2008  . Mitral valve disorder   . Pacemaker November 2007    s/p placement complete heart block status  . Near syncope 12/12/2011  . Aortic stenosis, moderate to severe 12/12/2011  . Chronic degenerative aortic valve disease 12/12/2011  . PAF (paroxysmal atrial fibrillation) 12/12/2011  . H/O hemolytic anemia due to drugs 12/12/2011  . SSS (sick sinus syndrome) with medtronic pacemaker, placed 6 years ago, with micro dislodgement of atrial lead, though still paces--for CHB and paf 12/12/2011  . UTI (lower urinary tract infection), curent and history of UTIs on Keflex daily 12/12/2011  . CAD (coronary artery disease), cardiac cath  12/12/2011  . Anemia, secondary to chronic disease 12/12/2011  . Pacemaker complications     Known chronic dysfunction of atrial lead with high pacing trhesholds but good sensing.  . Chronic combined systolic and diastolic CHF, NYHA class 2, in combination with AS, MR 12/15/2011  . Bleeding  of eye retinal eye bleed  . Stroke   . Hypertension   . CVA (cerebral infarction)      PSH: Past Surgical History  Procedure Laterality Date  . Pacemaker insertion  November 2007    Medtronic EnRhythm dual-chamber [id number]  . Cardiac catheterization  2005    60% stenosis of 1st diagnoal branch, 60-70% stenosis of posterolateral brach of distal RCA, preserved LV with EF 50-55%, diastolic dysfunction  . Abdominal hysterectomy    . Fracture surgery Right 2000, 2001    R ankle  . Fracture surgery Left 1992    shoulder  . Eye surgery  retinal eye bleed  . Eye surgery Bilateral 2003     cataracts  . Eye surgery Bilateral 2014    lazar  . Cholecystectomy     I have reviewed the FH and SH and  If appropriate update it with new information. Allergies  Allergen Reactions  . Floxin [Ofloxacin] Other (See Comments)    nightmares  . Sulfa Antibiotics Other (See Comments)    From when younger  . Darvocet [Propoxyphene N-Acetaminophen] Rash  . Penicillins Rash   Scheduled Meds: . amiodarone  200 mg Oral Daily  . calcitRIOL  0.25 mcg Oral Daily  . cephALEXin  250 mg Oral Daily  . docusate sodium  200 mg Oral Daily  . ezetimibe-simvastatin  1 tablet Oral Daily  . furosemide  40 mg Oral QODAY  . insulin aspart  0-5 Units Subcutaneous QHS  . insulin aspart  0-9 Units Subcutaneous TID WC  . insulin glargine  8 Units Subcutaneous q morning - 10a  . levothyroxine  50 mcg Oral QAC breakfast  . nystatin-triamcinolone   Topical BID  . sevelamer carbonate  800 mg Oral TID WC  . sodium chloride  3 mL Intravenous Q12H   Continuous Infusions:  PRN Meds:.guaifenesin, ondansetron (ZOFRAN) IV, ondansetron, senna-docusate, sodium chloride, traMADol    BP 118/64  Pulse 60  Temp(Src) 96.4 F (35.8 C) (Oral)  Resp 18  Ht 4\' 11"  (1.499 m)  Wt 58.3 kg (128 lb 8.5 oz)  BMI 25.95 kg/m2  SpO2 99%   PPS: 20-30%   Intake/Output Summary (Last 24 hours) at 08/09/13 1318 Last data filed at 08/09/13 0900  Gross per 24 hour  Intake    240 ml  Output      0 ml  Net    240 ml    Physical Exam:  General: able to communicate her own wishes and needs.  Speech weak HEENT:  [place], AT PERRL, mmm, Chest:  Wet cough, distant breath sounds,  No wheezing CVS: irregular , S1, S2 Abdomen: soft , not tender, positive bowel sounds Ext: multiple areas of ecchymosis,  Neuro: awake , alert oriented, CNII-XII intact.  Labs: CBC    Component Value Date/Time   WBC 8.3 08/09/2013 0530   WBC 5.9 05/08/2013 1623   RBC 2.09* 08/09/2013 0530   RBC 2.61* 05/08/2013 1623   HGB 6.6* 08/09/2013 0530   HCT  19.6* 08/09/2013 0530   PLT 172 08/09/2013 0530   MCV 93.8 08/09/2013 0530   MCH 31.6 08/09/2013 0530   MCH 32.2 05/08/2013 1623   MCHC 33.7 08/09/2013 0530   MCHC 33.3 05/08/2013 1623   RDW 22.7* 08/09/2013 0530   RDW 16.1* 05/08/2013 1623   LYMPHSABS 1.4 08/04/2013 0118   MONOABS 0.6 08/04/2013 0118   EOSABS 0.1 08/04/2013 0118   BASOSABS 0.0 08/04/2013 0118      CMP  Component Value Date/Time   NA 138 08/08/2013 0454   K 3.8 08/08/2013 0454   CL 103 08/08/2013 0454   CO2 21 08/08/2013 0454   GLUCOSE 137* 08/08/2013 0454   BUN 108* 08/08/2013 0454   CREATININE 2.62* 08/08/2013 0454   CREATININE 2.94* 06/02/2013 1513   CALCIUM 9.1 08/08/2013 0454   PROT 5.0* 08/08/2013 1024   ALBUMIN 2.2* 08/08/2013 1024   AST 20 08/08/2013 1024   ALT 17 08/08/2013 1024   ALKPHOS 67 08/08/2013 1024   BILITOT 0.3 08/08/2013 1024   GFRNONAA 15* 08/08/2013 0454   GFRAA 18* 08/08/2013 0454    Chest Xray Reviewed/Impressions: S/p no airspace disease or edema    Time In Time Out Total Time Spent with Patient Total Overall Time  1215 am  1 am  45 min 45 min    Greater than 50%  of this time was spent counseling and coordinating care related to the above assessment and plan.   Baily Hovanec L. Ladona Ridgelaylor, MD MBA The Palliative Medicine Team at [facility] Team Phone: [phone] Pager: [phone]

## 2013-08-09 NOTE — Discharge Summary (Addendum)
Physician Discharge Summary  [name] ZOX:[id number] DOB: 1925-05-31 DOA: 08/03/2013  PCP: Ginette Otto, MD  Admit date: 08/03/2013 Discharge date: 08/09/2013  Recommendations for Outpatient Follow-up:  1. Home with hospice care   Discharge Diagnoses:  Principal Problem:   Symptomatic anemia Active Problems:   Aortic stenosis, severe   Mitral regurgitation, moderate to severe   PAF (paroxysmal atrial fibrillation)   Diabetes mellitus, insulin dependent (IDDM), controlled   UTI (lower urinary tract infection), curent and history of UTIs on Keflex daily   CAD (coronary artery disease), cardiac cath    Anemia   CKD (chronic kidney disease) stage 5, GFR less than 15 ml/min   Chronic diastolic CHF (congestive heart failure), NYHA class 2   CVA (cerebral infarction)   Acute encephalopathy   Acute blood loss anemia Symptomatic anemia/Acute Blood Loss Anemia  -no overt sign of bleed  -CT abdomen and pelvis negative for retroperitoneal bleed  -Hemoglobin was found to be as low as 5.4, given 2 units PRBCs (08/05/13)  -hemoglobin 8.4-->6.6 on 08/06/13  -transfuse additional 2 units PRBCs (08/06/13)-->Hgb 9.5  -Hemoglobin dropped to 6.6 (08/09/2013)--> transfused one additional unit PRBC (5 units total for the admission)  -appreciate GI input  -check LFTs, LDH, Haptoglobin--no signs of hemolysis  -Fecal occult positive  -Anemia panel: Iron 132 TIBC 252, Ferritin 701  -Family favoring home with hospice--palliative medicine consulted  -No further endoscopy at this time  -Dr. Ladona Ridgel saw the patient. After discussion, the patient will be going home with hospice care. The patient and the family did not want any further blood transfusions. Goals of care were clarified. Family did not want any further procedures to be performed. Atrial fibrillation with supratherapeutic INR  -INR was 4.37, patient was given Vitamin K  -INR currently 1.15  -Coumadin per pharmacy--on hold presently until  anemia workup finished  -Atrial fibrillation currently rate controlled with amiodarone  -discussed with pt's daughter-->hold coumadin for now--we'll not restart  Acute encephalopathy  -improved with stopping ambien and benadryl  -due to combination of benadryl and ambien  -d/c ambien and benadryl during the hospitalization -Restart Ambien upon discharge Aortic stenosis, mitral regurgitation  -Stable, on telemetry monitoring  Chronic UTI  -Continue Keflex prophylaxis  Diabetes mellitus  -Continue Lantus, insulin sliding scale, CBG monitoring  -check A1c--5.7  -Patient and family wish to continue on insulin regimen at home History of chronic diastolic heart failure  -Continue Lasix  -Will place patient on fluid restriction, monitor her daily weights as well as in output  Hypothyroidism  -Continue Synthroid  CKD stage V  - serum creatinine 2.94 on 06/02/2013  -GFR 14  -Serum creatinine stable around 2.6  -Patient is adamant about not having dialysis.  -Will need outpatient nephrology followup for possible continued aranesp injections.  -Will continue to monitor  Generalized weakness  -appreciate PT  -Likely secondary to anemia.  Code Status: DO NOT RESUSCITATE  Family Communication: son at bedside. Discussed patient with daughter via phone.     Discharge Condition: Stable  Disposition:  home with hospice  Diet: Regular diet Wt Readings from Last 3 Encounters:  08/08/13 58.3 kg (128 lb 8.5 oz)  07/14/13 59.875 kg (132 lb)  04/15/13 59.194 kg (130 lb 8 oz)    History of present illness:  80 year old female history of chronic kidney disease, anemia of chronic kidney disease, severe aortic stenosis as well as moderate mitral regurgitation, atrial fibrillation on Coumadin presented to the emergency room with generalized weakness. Patient was found to  have a hemoglobin of approximately 6 however her lowest hemoglobin was 5.4. Patient was given 2 units packed red blood cells.  Anemia panel showed an adequate amount of iron as well as iron stores. Patient may need outpatient EPO injections or Aranesp. One dose of Aranesp given. The patient was transfused with a total of 5 units PRBCs during admission. Even after each PRBC transfusion, the patient's hemoglobin continued to drop. Vitamin K was given. The patient's INR corrected. However her hemoglobin continued to drop as discussed. Gastroenterology was consulted. After discussion with the patient's family, they did not want any further invasive procedures. Palliative medicine was consulted. Goals of care were clarified. The patient wanted to go home with hospice care which would be arranged.     Consultants: [facility] Palliative medicine  Discharge Exam: Filed Vitals:   08/09/13 1223  BP: 118/64  Pulse: 60  Temp: 96.4 F (35.8 C)  Resp: 18   Filed Vitals:   08/09/13 0935 08/09/13 1030 08/09/13 1145 08/09/13 1223  BP: 93/53 92/47 112/63 118/64  Pulse: 60 63 59 60  Temp: 97.5 F (36.4 C) 97.4 F (36.3 C) 96.2 F (35.7 C) 96.4 F (35.8 C)  TempSrc: Oral Oral Oral Oral  Resp: 18 16 18 18   Height:      Weight:      SpO2: 91% 97% 98% 99%   General: A&O x 3, NAD, pleasant, cooperative Cardiovascular: RRR, no rub, no gallop, no S3 Respiratory: CTAB, no wheeze, no rhonchi Abdomen:soft, nontender, nondistended, positive bowel sounds Extremities: No edema, No lymphangitis, no petechiae  Discharge Instructions      Discharge Orders   Future Appointments Provider Department Dept Phone   08/22/2013 2:00 PM Phillips HayKristin Alvstad, RPH-CPP [facility] [phone]   Future Orders Complete By Expires   Diet - low sodium heart healthy  As directed    Increase activity slowly  As directed        Medication List    STOP taking these medications       diphenhydrAMINE 25 MG tablet  Commonly known as:  BENADRYL     potassium chloride 10 MEQ tablet  Commonly known as:  K-DUR,KLOR-CON      warfarin 5 MG tablet  Commonly known as:  COUMADIN      TAKE these medications       acetaminophen 500 MG tablet  Commonly known as:  TYLENOL  Take 500-1,000 mg by mouth 2 (two) times daily as needed for pain.     ALIGN 4 MG Caps  Take 1 capsule by mouth daily.     amiodarone 200 MG tablet  Commonly known as:  PACERONE  Take 200 mg by mouth daily.     butalbital-acetaminophen-caffeine 50-325-40 MG per tablet  Commonly known as:  FIORICET, ESGIC  Take 1 tablet by mouth daily as needed for headache.     calcitRIOL 0.25 MCG capsule  Commonly known as:  ROCALTROL  Take 0.25 mcg by mouth daily.     cephALEXin 250 MG capsule  Commonly known as:  KEFLEX  Take 250 mg by mouth daily.     dextromethorphan-guaiFENesin 30-600 MG per 12 hr tablet  Commonly known as:  MUCINEX DM  Take 1 tablet by mouth 2 (two) times daily.     docusate sodium 100 MG capsule  Commonly known as:  COLACE  Take 200 mg by mouth daily.     furosemide 40 MG tablet  Commonly known as:  LASIX  Alternate, taking 40  mg (one tablet) every other day and 80 mg (two tablets) the other days.     guaifenesin 100 MG/5ML syrup  Commonly known as:  ROBITUSSIN  Take 10 mLs (200 mg total) by mouth every 4 (four) hours as needed for congestion.     HYDROcodone-acetaminophen 5-325 MG per tablet  Commonly known as:  NORCO/VICODIN  1/2 to 1 tablet q 4-6 hours prn pain     insulin glargine 100 UNIT/ML injection  Commonly known as:  LANTUS  Inject 12 Units into the skin every morning.     levothyroxine 50 MCG tablet  Commonly known as:  SYNTHROID  Take 1 tablet (50 mcg total) by mouth daily before breakfast.     NOVOLOG FLEXPEN 100 UNIT/ML injection  Generic drug:  insulin aspart  Inject 0-10 Units into the skin 3 (three) times daily before meals. Sliding scale     potassium chloride 10 MEQ tablet  Commonly known as:  K-DUR  Take 10-20 mEq by mouth daily. Alternate, taking 10 mEq (one tablet) every other day  and 20 mEq (two tablets) the other days.     RENVELA 800 MG tablet  Generic drug:  sevelamer carbonate  Take 1 tablet by mouth 3 (three) times daily.     sennosides-docusate sodium 8.6-50 MG tablet  Commonly known as:  SENOKOT-S  Take 1 tablet by mouth daily.     VYTORIN 10-20 MG per tablet  Generic drug:  ezetimibe-simvastatin  Take 1 tablet by mouth daily.     zolpidem 5 MG tablet  Commonly known as:  AMBIEN  Take 5 mg by mouth at bedtime.         The results of significant diagnostics from this hospitalization (including imaging, microbiology, ancillary and laboratory) are listed below for reference.    Significant Diagnostic Studies: Ct Abdomen Pelvis Wo Contrast  08/06/2013   CLINICAL DATA:  Decreasing hemoglobin. Possible retroperitoneal hemorrhage.  EXAM: CT ABDOMEN AND PELVIS WITHOUT CONTRAST  TECHNIQUE: Multidetector CT imaging of the abdomen and pelvis was performed following the standard protocol without intravenous contrast.  COMPARISON:  [facility] CT ABDOMEN AND PELVIS WITHOUT CONTRAST from 09/01/2005.  FINDINGS: No focal abnormality is seen in the liver or spleen on this study performed without intravenous contrast material. Scattered areas of calcification along the portal triads of the liver are presumably related to hepatic arterial atherosclerosis. Choledocholithiasis would be considered less likely although this is a possibility. There is no overt intra or extrahepatic biliary duct dilatation.  The stomach, duodenum, and adrenal glands are unremarkable. 8 mm low-density lesion is identified in the head of the pancreas. There is no pancreatic ductal dilatation. Gallbladder is surgically absent.  Both kidneys appear atrophic and have multiple mixed density cortical lesions. 2.3 cm exophytic lesion in the upper pole of the right kidney was 2.2 cm previously. 1.4 cm low-density lesion in the lower pole of the right kidney was 1.2 cm previously. Multiple  lesions in the left kidney are also not substantially changed. One of these in the lower pole (image 36) measures 2.2 cm today compared to 2.2 cm previously.  Dense atherosclerotic calcification is seen in the wall of the abdominal aorta without aneurysm. There is no free fluid or lymphadenopathy in the abdomen.  Imaging through the pelvis shows no free intraperitoneal fluid. There is no pelvic sidewall lymphadenopathy. Bladder is unremarkable. Uterus is surgically absent. There is no adnexal mass.  Advanced diverticular disease is seen in the sigmoid colon without evidence for  diverticulitis. The terminal ileum is normal  Small umbilical hernia contains only fat, as before. Areas of skin thickening in the lower anterior abdominal wall bilaterally are stable.  Bone windows reveal no worrisome lytic or sclerotic osseous lesions.  IMPRESSION: Stable exam. No acute findings in the abdomen or pelvis. Specifically, no evidence for retroperitoneal hemorrhage.  Areas of calcifications scattered along the portal triads of the liver have progressed in the interval. This is presumably secondary to atherosclerosis in the hepatic arteries. Choledocholithiasis is considered much less likely since some of these areas of mineralization were present previously and there is no appreciable intra or extrahepatic biliary duct dilatation.  Bilateral renal cysts without substantial change.  Left colonic diverticulosis without features of diverticulitis.   Electronically Signed   By: Kennith Center M.D.   On: 08/06/2013 11:15   Dg Chest 2 View  08/03/2013   CLINICAL DATA:  Fifty-eight.  Generalized weakness.  EXAM: CHEST  2 VIEW  COMPARISON:  03/18/2012  FINDINGS: Cardiac silhouette is normal in size. Normal mediastinal and hilar contours. Left anterior chest wall sequential pacemaker is stable in well positioned.  Clear lungs.  No pleural effusion pneumothorax.  There are old right-sided rib fractures. The bony thorax is demineralized.   IMPRESSION: No acute cardiopulmonary disease.   Electronically Signed   By: Amie Portland M.D.   On: 08/03/2013 21:30   Dg Chest Port 1 View  08/08/2013   CLINICAL DATA:  PICC line placement.  EXAM: PORTABLE CHEST - 1 VIEW  COMPARISON:  DG CHEST 2 VIEW dated 08/03/2013  FINDINGS: Right PICC line is in place with the tip in the SVC. Left pacer is unchanged. Heart is normal size. No confluent airspace opacities or effusions. No acute bony abnormality.  IMPRESSION: Right PICC line tip in the SVC.   Electronically Signed   By: Charlett Nose M.D.   On: 08/08/2013 19:29     Microbiology: No results found for this or any previous visit (from the past 240 hour(s)).   Labs: Basic Metabolic Panel:  Recent Labs Lab 08/04/13 0118 08/05/13 0510 08/06/13 0550 08/07/13 0730 08/08/13 0454  NA 135* 143 139 139 138  K 4.0 3.8 4.5 4.4 3.8  CL 99 104 104 103 103  CO2 24 22 23 22 21   GLUCOSE 156* 152* 152* 169* 137*  BUN 116* 109* 107* 106* 108*  CREATININE 3.22* 2.76* 2.60* 2.58* 2.62*  CALCIUM 9.7 9.7 9.2 9.5 9.1   Liver Function Tests:  Recent Labs Lab 08/03/13 2136 08/04/13 0118 08/08/13 1024  AST 19 17 20   ALT 14 12 17   ALKPHOS 66 58 67  BILITOT <0.2* <0.2* 0.3  PROT 5.8* 5.2* 5.0*  ALBUMIN 3.0* 2.8* 2.2*   No results found for this basename: LIPASE, AMYLASE,  in the last 168 hours No results found for this basename: AMMONIA,  in the last 168 hours CBC:  Recent Labs Lab 08/03/13 2010  08/04/13 0118 08/05/13 0510 08/06/13 0550 08/07/13 0730 08/08/13 0801 08/09/13 0530  WBC 5.4  --  5.7 8.2 7.0 11.0* 7.7 8.3  NEUTROABS 3.7  --  3.6  --   --   --   --   --   HGB 6.7*  < > 5.4* 8.4* 6.6* 9.5* 7.6* 6.6*  HCT 19.9*  < > 16.3* 25.0* 19.4* 27.9* 22.8* 19.6*  MCV 97.5  --  99.4 91.9 93.7 90.9 92.7 93.8  PLT 215  --  175 189 162 183 PLATELET CLUMPS NOTED ON SMEAR,  UNABLE TO ESTIMATE 172  < > = values in this interval not displayed. Cardiac Enzymes: No results found for this  basename: CKTOTAL, CKMB, CKMBINDEX, TROPONINI,  in the last 168 hours BNP: No components found with this basename: POCBNP,  CBG:  Recent Labs Lab 08/08/13 1140 08/08/13 1704 08/08/13 2111 08/09/13 0752 08/09/13 1152  GLUCAP 208* 175* 164* 136* 152*    Time coordinating discharge:  Greater than 30 minutes  Signed:  Sadee Osland, DO [facility] Pager: [phone] 08/09/2013, 3:12 PM

## 2013-08-09 NOTE — Progress Notes (Signed)
Patient was discharged home with home hospice. Patient was given prescriptions and discharge instructions. Patient was discharged with belongings and R PICC line. Patient was stable upon discharge.

## 2013-08-09 NOTE — Progress Notes (Addendum)
[facility] PROGRESS NOTE  Lindsay AlanisMartha Parker ZOX:[id number] DOB: May 13, 1925 DOA: 08/03/2013 PCP: Ginette OttoSTONEKING,HAL THOMAS, MD  Assessment/Plan: Symptomatic anemia/Acute Blood Loss Anemia  -no overt sign of bleed -CT abdomen and pelvis negative for retroperitoneal bleed -Hemoglobin was found to be as low as 5.4, given 2 units PRBCs (08/05/13)  -hemoglobin 8.4-->6.6 on 08/06/13  -transfuse additional 2 units PRBCs (08/06/13)-->Hgb 9.5  -Hemoglobin dropped to 6.6 (08/09/2013)--> transfused one additional unit PRBC (5 units total for the admission) -appreciate GI input -check LFTs, LDH, Haptoglobin--no signs of hemolysis  -Fecal occult positive  -Anemia panel: Iron 132 TIBC 252, Ferritin 701  -Family favoring home with hospice--palliative medicine consulted -No further endoscopy at this time  Atrial fibrillation with supratherapeutic INR  -INR was 4.37, patient was given Vitamin K  -INR currently 1.15  -Coumadin per pharmacy--on hold presently until anemia workup finished  -Atrial fibrillation currently rate controlled with amiodarone  -discussed with pt's daughter-->hold coumadin for now  Acute encephalopathy  -improved with stopping ambien and benadryl  -due to combination of benadryl and ambien  -d/c ambien and benadryl  Aortic stenosis, mitral regurgitation  -Stable, on telemetry monitoring  Chronic UTI  -Continue Keflex prophylaxis  Diabetes mellitus  -Continue Lantus, insulin sliding scale, CBG monitoring  -check A1c--5.7  History of chronic diastolic heart failure  -Continue Lasix  -Will place patient on fluid restriction, monitor her daily weights as well as in output  Hypothyroidism  -Continue Synthroid  CKD stage V  - serum creatinine 2.94 on 06/02/2013  -GFR 14  -Serum creatinine stable around 2.6  -Patient is adamant about not having dialysis.  -Will need outpatient nephrology followup for possible continued aranesp injections.  -Will continue to monitor  Generalized  weakness  -appreciate PT  -Likely secondary to anemia.  Code Status: DO NOT RESUSCITATE  Family Communication: None at bedside. Discussed patient with daughter via phone.  Disposition Plan: home when stable           Procedures/Studies: Ct Abdomen Pelvis Wo Contrast  08/06/2013   CLINICAL DATA:  Decreasing hemoglobin. Possible retroperitoneal hemorrhage.  EXAM: CT ABDOMEN AND PELVIS WITHOUT CONTRAST  TECHNIQUE: Multidetector CT imaging of the abdomen and pelvis was performed following the standard protocol without intravenous contrast.  COMPARISON:  [facility] CT ABDOMEN AND PELVIS WITHOUT CONTRAST from 09/01/2005.  FINDINGS: No focal abnormality is seen in the liver or spleen on this study performed without intravenous contrast material. Scattered areas of calcification along the portal triads of the liver are presumably related to hepatic arterial atherosclerosis. Choledocholithiasis would be considered less likely although this is a possibility. There is no overt intra or extrahepatic biliary duct dilatation.  The stomach, duodenum, and adrenal glands are unremarkable. 8 mm low-density lesion is identified in the head of the pancreas. There is no pancreatic ductal dilatation. Gallbladder is surgically absent.  Both kidneys appear atrophic and have multiple mixed density cortical lesions. 2.3 cm exophytic lesion in the upper pole of the right kidney was 2.2 cm previously. 1.4 cm low-density lesion in the lower pole of the right kidney was 1.2 cm previously. Multiple lesions in the left kidney are also not substantially changed. One of these in the lower pole (image 36) measures 2.2 cm today compared to 2.2 cm previously.  Dense atherosclerotic calcification is seen in the wall of the abdominal aorta without aneurysm. There is no free fluid or lymphadenopathy in the abdomen.  Imaging through the pelvis shows no free intraperitoneal fluid. There is no pelvic sidewall  lymphadenopathy. Bladder is unremarkable. Uterus is surgically absent. There is no adnexal mass.  Advanced diverticular disease is seen in the sigmoid colon without evidence for diverticulitis. The terminal ileum is normal  Small umbilical hernia contains only fat, as before. Areas of skin thickening in the lower anterior abdominal wall bilaterally are stable.  Bone windows reveal no worrisome lytic or sclerotic osseous lesions.  IMPRESSION: Stable exam. No acute findings in the abdomen or pelvis. Specifically, no evidence for retroperitoneal hemorrhage.  Areas of calcifications scattered along the portal triads of the liver have progressed in the interval. This is presumably secondary to atherosclerosis in the hepatic arteries. Choledocholithiasis is considered much less likely since some of these areas of mineralization were present previously and there is no appreciable intra or extrahepatic biliary duct dilatation.  Bilateral renal cysts without substantial change.  Left colonic diverticulosis without features of diverticulitis.   Electronically Signed   By: Kennith Center M.D.   On: 08/06/2013 11:15   Dg Chest 2 View  08/03/2013   CLINICAL DATA:  Fifty-eight.  Generalized weakness.  EXAM: CHEST  2 VIEW  COMPARISON:  03/18/2012  FINDINGS: Cardiac silhouette is normal in size. Normal mediastinal and hilar contours. Left anterior chest wall sequential pacemaker is stable in well positioned.  Clear lungs.  No pleural effusion pneumothorax.  There are old right-sided rib fractures. The bony thorax is demineralized.  IMPRESSION: No acute cardiopulmonary disease.   Electronically Signed   By: Amie Portland M.D.   On: 08/03/2013 21:30   Dg Chest Port 1 View  08/08/2013   CLINICAL DATA:  PICC line placement.  EXAM: PORTABLE CHEST - 1 VIEW  COMPARISON:  DG CHEST 2 VIEW dated 08/03/2013  FINDINGS: Right PICC line is in place with the tip in the SVC. Left pacer is unchanged. Heart is normal size. No confluent airspace  opacities or effusions. No acute bony abnormality.  IMPRESSION: Right PICC line tip in the SVC.   Electronically Signed   By: Charlett Nose M.D.   On: 08/08/2013 19:29         Subjective: Patient complains of dyspnea with exertion. She feels more fatigued. Denies any fevers, chills, chest discomfort, dizziness, nausea, vomiting, diarrhea. No abdominal pain. No hematemesis, hematochezia, melena.  Objective: Filed Vitals:   08/08/13 2114 08/08/13 2200 08/09/13 0457 08/09/13 0519  BP: 78/29  82/26   Pulse: 61  121   Temp:  98.7 F (37.1 C)  98.3 F (36.8 C)  TempSrc:  Oral  Oral  Resp: 16  20   Height:      Weight:      SpO2: 99%  100%     Intake/Output Summary (Last 24 hours) at 08/09/13 0844 Last data filed at 08/09/13 6295  Gross per 24 hour  Intake    360 ml  Output      0 ml  Net    360 ml   Weight change:  Exam:   General:  Pt is alert, follows commands appropriately, not in acute distress  HEENT: No icterus, No thrush, [place]/AT  Cardiovascular: RRR, S1/S2, no rubs, no gallops  Respiratory: Bibasilar rales. No wheezing. Good air movement.  Abdomen: Soft/+BS, non tender, non distended, no guarding  Extremities: No edema, No lymphangitis, No petechiae, No rashes, no synovitis  Data Reviewed: Basic Metabolic Panel:  Recent Labs Lab 08/04/13 0118 08/05/13 0510 08/06/13 0550 08/07/13 0730 08/08/13 0454  NA 135* 143 139 139 138  K 4.0 3.8 4.5 4.4 3.8  CL 99 104 104 103 103  CO2 24 22 23 22 21   GLUCOSE 156* 152* 152* 169* 137*  BUN 116* 109* 107* 106* 108*  CREATININE 3.22* 2.76* 2.60* 2.58* 2.62*  CALCIUM 9.7 9.7 9.2 9.5 9.1   Liver Function Tests:  Recent Labs Lab 08/03/13 2136 08/04/13 0118 08/08/13 1024  AST 19 17 20   ALT 14 12 17   ALKPHOS 66 58 67  BILITOT <0.2* <0.2* 0.3  PROT 5.8* 5.2* 5.0*  ALBUMIN 3.0* 2.8* 2.2*   No results found for this basename: LIPASE, AMYLASE,  in the last 168 hours No results found for this basename: AMMONIA,   in the last 168 hours CBC:  Recent Labs Lab 08/03/13 2010  08/04/13 0118 08/05/13 0510 08/06/13 0550 08/07/13 0730 08/08/13 0801 08/09/13 0530  WBC 5.4  --  5.7 8.2 7.0 11.0* 7.7 8.3  NEUTROABS 3.7  --  3.6  --   --   --   --   --   HGB 6.7*  < > 5.4* 8.4* 6.6* 9.5* 7.6* 6.6*  HCT 19.9*  < > 16.3* 25.0* 19.4* 27.9* 22.8* 19.6*  MCV 97.5  --  99.4 91.9 93.7 90.9 92.7 93.8  PLT 215  --  175 189 162 183 PLATELET CLUMPS NOTED ON SMEAR, UNABLE TO ESTIMATE 172  < > = values in this interval not displayed. Cardiac Enzymes: No results found for this basename: CKTOTAL, CKMB, CKMBINDEX, TROPONINI,  in the last 168 hours BNP: No components found with this basename: POCBNP,  CBG:  Recent Labs Lab 08/08/13 0836 08/08/13 1140 08/08/13 1704 08/08/13 2111 08/09/13 0752  GLUCAP 156* 208* 175* 164* 136*    No results found for this or any previous visit (from the past 240 hour(s)).   Scheduled Meds: . amiodarone  200 mg Oral Daily  . calcitRIOL  0.25 mcg Oral Daily  . cephALEXin  250 mg Oral Daily  . docusate sodium  200 mg Oral Daily  . ezetimibe-simvastatin  1 tablet Oral Daily  . furosemide  40 mg Oral QODAY  . insulin aspart  0-5 Units Subcutaneous QHS  . insulin aspart  0-9 Units Subcutaneous TID WC  . insulin glargine  8 Units Subcutaneous q morning - 10a  . levothyroxine  50 mcg Oral QAC breakfast  . nystatin-triamcinolone   Topical BID  . sevelamer carbonate  800 mg Oral TID WC  . sodium chloride  3 mL Intravenous Q12H   Continuous Infusions:    Goldy Calandra, DO  [facility] Pager [phone]  If 7PM-7AM, please contact night-coverage [email or web address] Password [email or web address] 08/09/2013, 8:44 AM   LOS: 6 days

## 2013-08-09 NOTE — Progress Notes (Signed)
CRITICAL VALUE ALERT  Critical value received: hemoglobin 6.6  Date of notification: 08/09/13 Time of notification:  0637 Critical value read back:yes  Nurse who received alert:Dakari Stabler,RN MD notified (1st page):  Verta Ellen. Callahan,NP  Time of first page: [phone] MD notified (2nd page):  Time of second page:  Responding MD:  Verta Ellen. Callahan,NP Time MD responded: [phone]

## 2013-08-10 LAB — TYPE AND SCREEN
ABO/RH(D): O POS
Antibody Screen: NEGATIVE
Unit division: 0
Unit division: 0

## 2013-08-21 ENCOUNTER — Ambulatory Visit: Payer: Self-pay | Admitting: Pharmacist Clinician (PhC)/ Clinical Pharmacy Specialist

## 2013-08-21 DIAGNOSIS — Z7901 Long term (current) use of anticoagulants: Secondary | ICD-10-CM

## 2013-08-21 DIAGNOSIS — I480 Paroxysmal atrial fibrillation: Secondary | ICD-10-CM

## 2013-08-22 ENCOUNTER — Ambulatory Visit: Payer: MEDICARE | Admitting: Pharmacist Clinician (PhC)/ Clinical Pharmacy Specialist

## 2013-09-12 ENCOUNTER — Other Ambulatory Visit: Payer: Self-pay | Admitting: *Deleted

## 2013-09-12 MED ORDER — POTASSIUM CHLORIDE ER 10 MEQ PO TBCR: EXTENDED_RELEASE_TABLET | ORAL | Status: AC

## 2013-09-12 MED ORDER — POTASSIUM CHLORIDE ER 10 MEQ PO TBCR: 10.0000 meq | EXTENDED_RELEASE_TABLET | Freq: Every day | ORAL | Status: DC

## 2013-09-24 NOTE — Telephone Encounter (Signed)
Encounter closed---TP 09/24/13 

## 2013-10-03 IMAGING — CT CT HEAD W/O CM
1 of 2 series · 15 of 30 positions shown, 19 images · non-contrast
Comparison: None.

CLINICAL DATA: Code stroke. Acute onset right arm weakness.

CT HEAD WITHOUT CONTRAST
TECHNIQUE: Contiguous axial images were obtained from the base of
the skull through the vertex without contrast.

[Series 2: head trauma 4.8 h37s · axial · 0.43mm/px · z∈[-123,+40]mm · 15 of 36 slices shown, 19 images]
[im 2/36  brain]
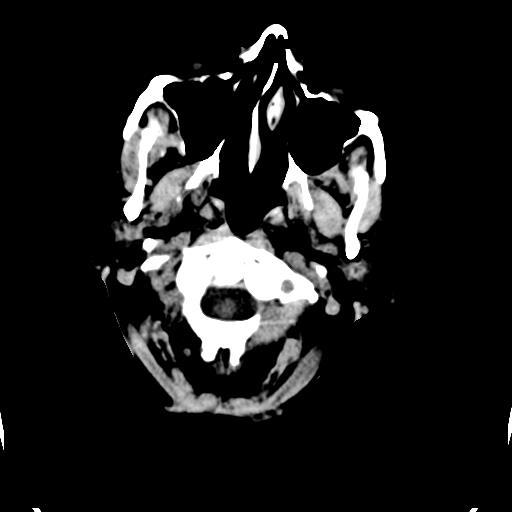
[im 2/36  bone]
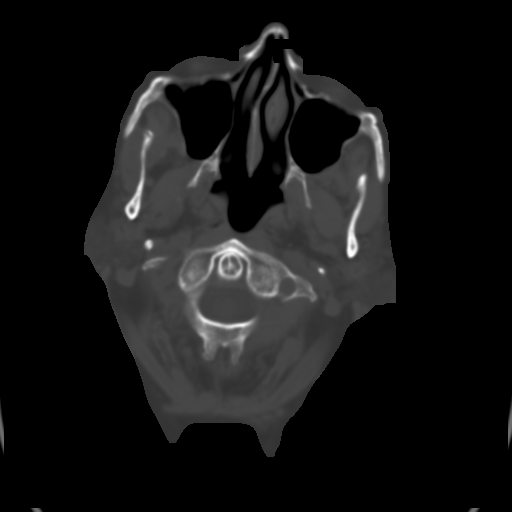
[im 6/36  brain]
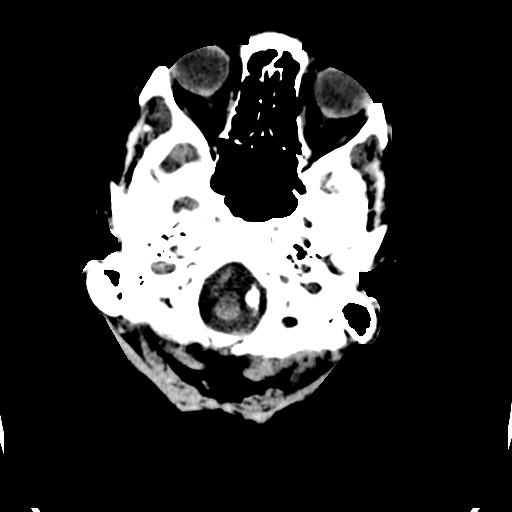
[im 7/36  brain]
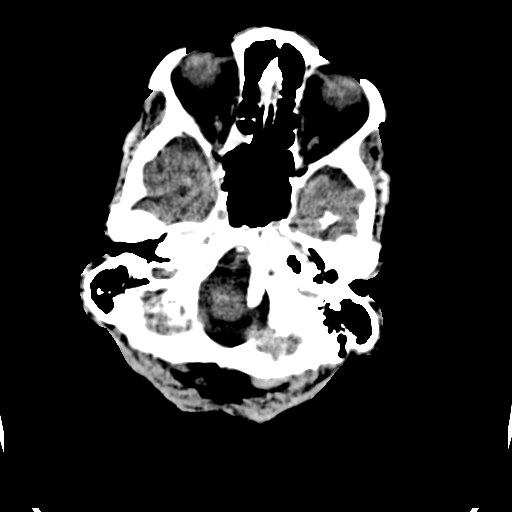
[im 9/36  brain]
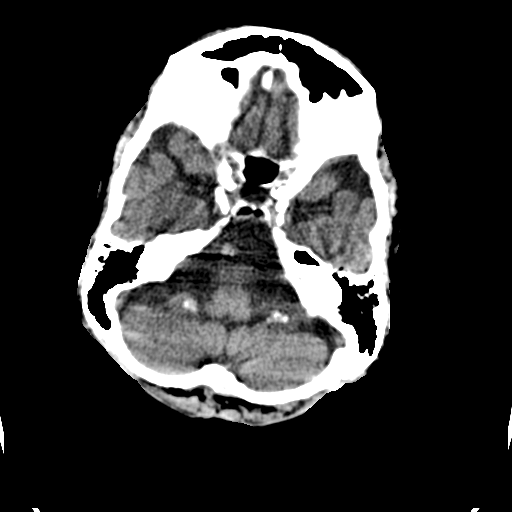
[im 12/36  brain]
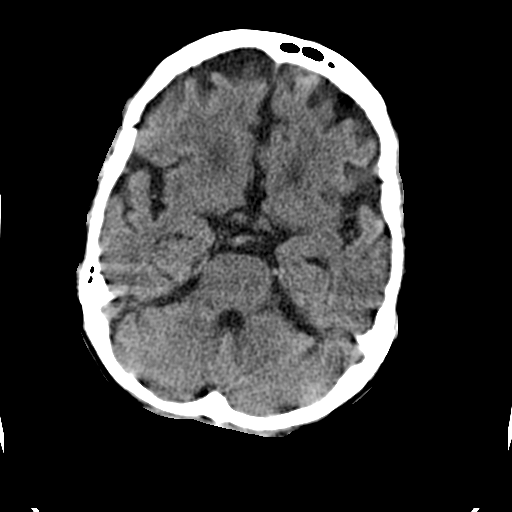
[im 12/36  bone]
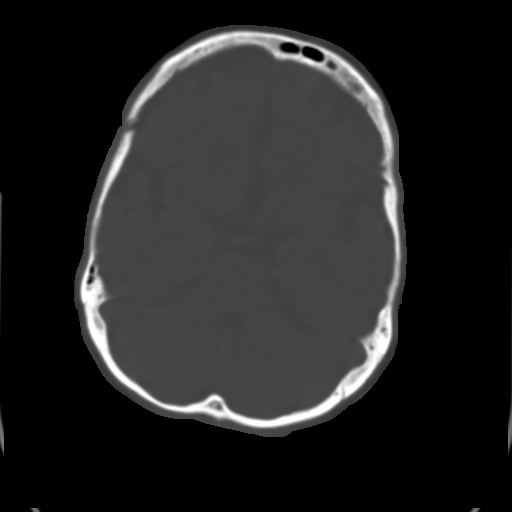
[im 14/36  brain]
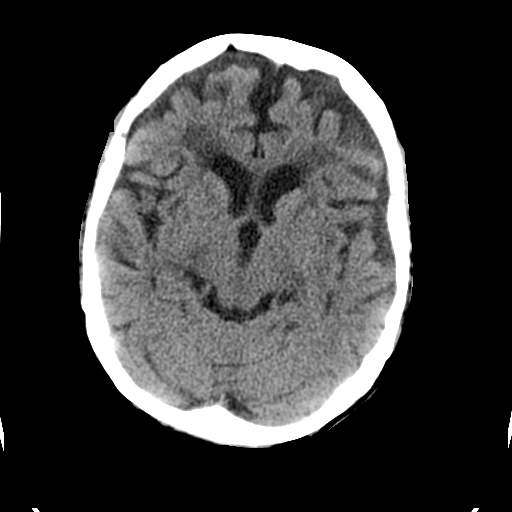
[im 16/36  brain]
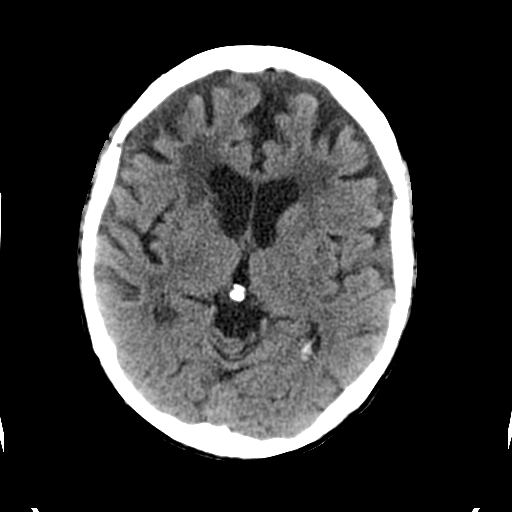
[im 19/36  brain]
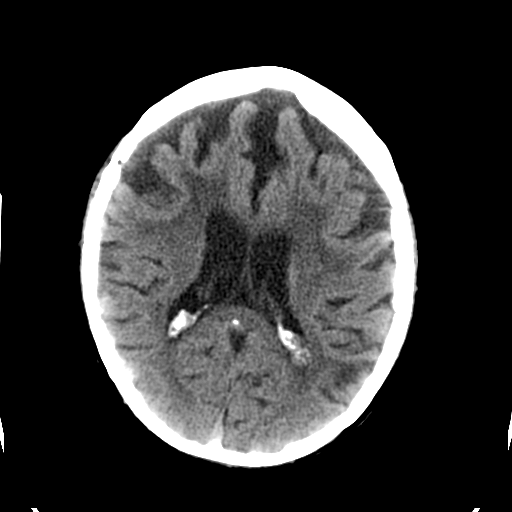
[im 21/36  brain]
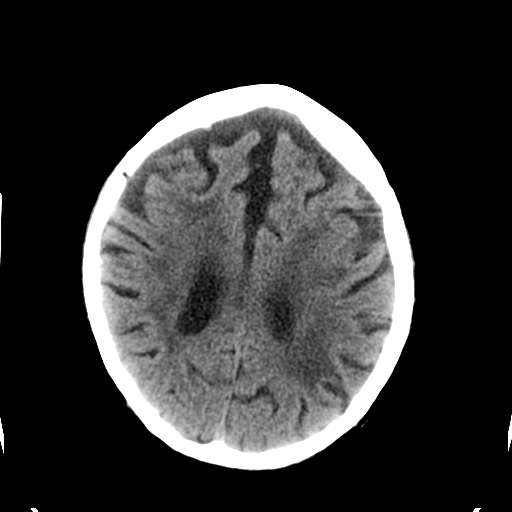
[im 21/36  bone]
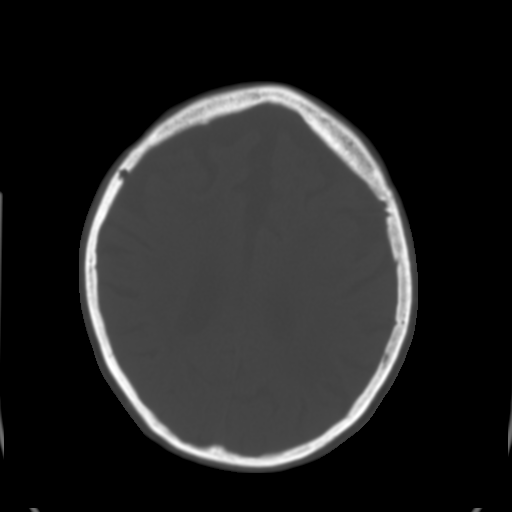
[im 22/36  brain]
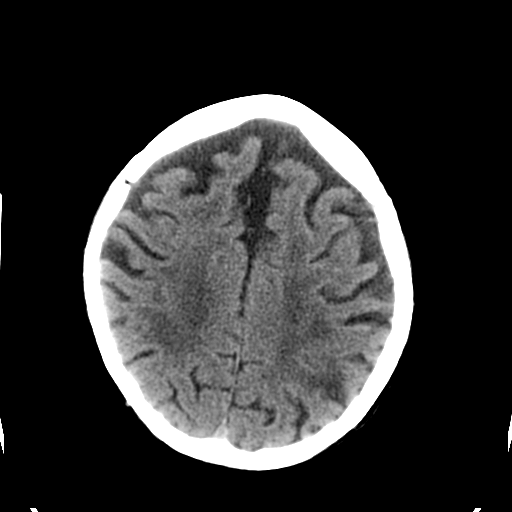
[im 26/36  brain]
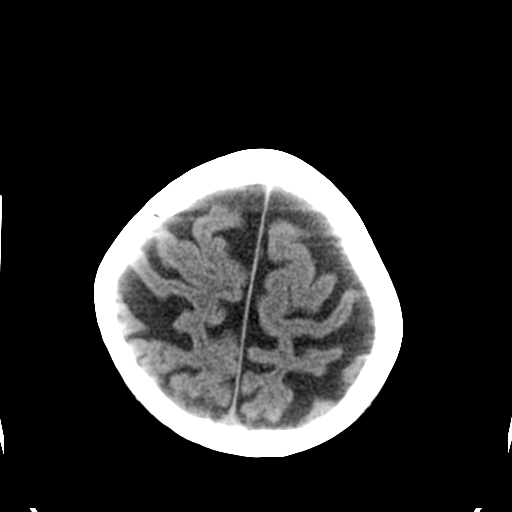
[im 27/36  brain]
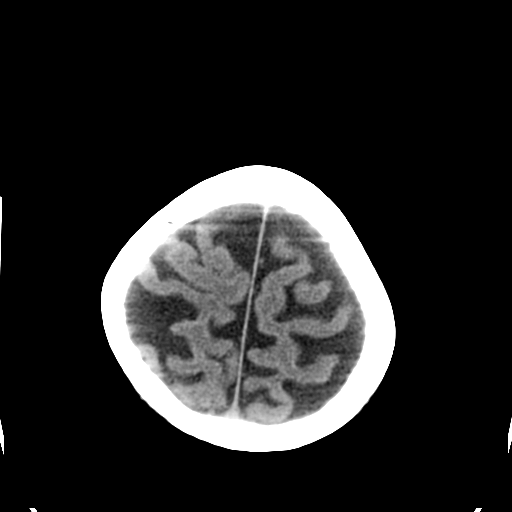
[im 29/36  brain]
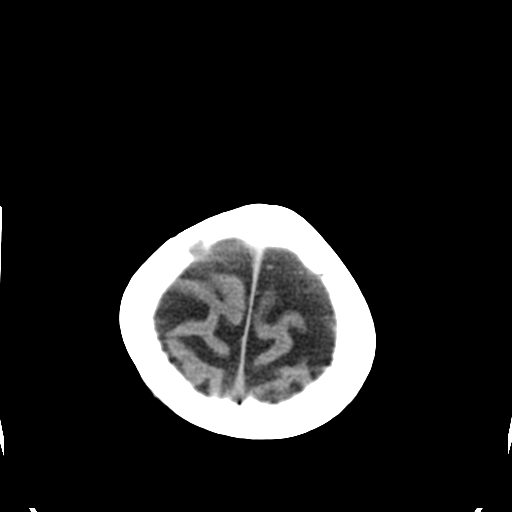
[im 29/36  bone]
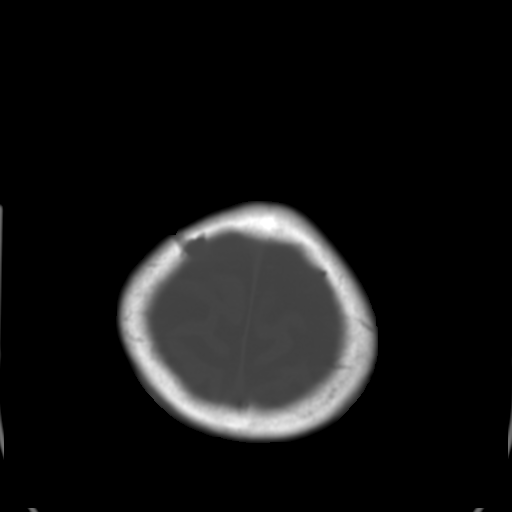
[im 32/36  brain]
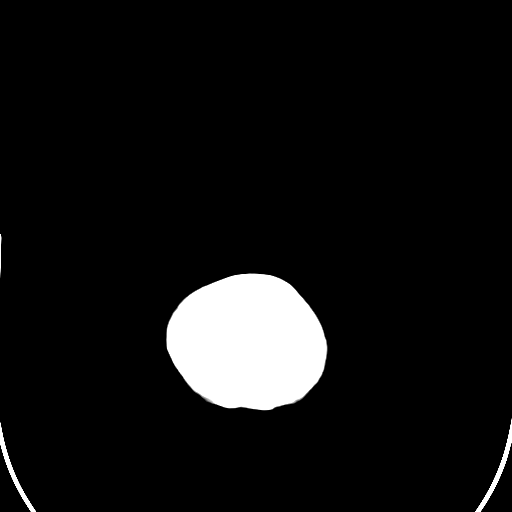
[im 34/36  brain]
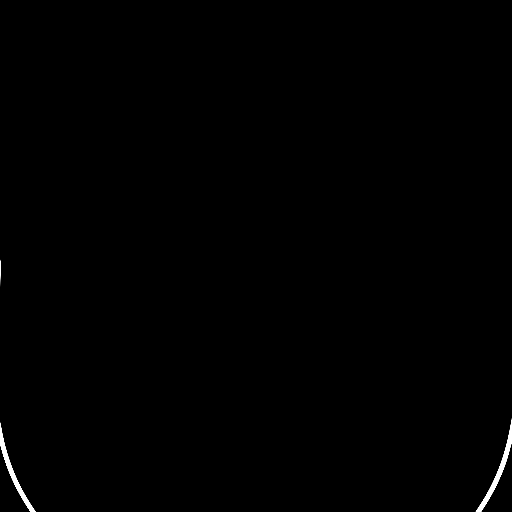

[15 of 30 positions shown; findings below may reference images not displayed]

FINDINGS: There is no evidence of intracranial hemorrhage, brain
edema or other signs of acute infarction.  There is no evidence of
intracranial mass lesion or mass effect.  No abnormal extra-axial
fluid collections are identified.

Moderate diffuse cerebral atrophy is seen.  Extensive chronic white
matter disease is also demonstrated.  Old right caudate lacunar
infarct is noted.  No evidence of hydrocephalus.  No skull fracture
or other bone lesions identified.
IMPRESSION: 1.  No acute intracranial abnormality.
2.  Diffuse cerebral atrophy and extensive chronic small vessel
disease.  Old right basal ganglia lacunar infarct.

These results were called by telephone on 03/17/2012 at 0790 hours
to Dr. Chenu in the emergency department, who verbally
acknowledged these results.

## 2013-10-04 IMAGING — CR DG CHEST 2V
1 series · 1 of 1 positions shown · non-contrast
Comparison: 12/13/2011

CLINICAL DATA: Shortness of breath.  Stroke.

CHEST - 2 VIEW

[w chest lat]
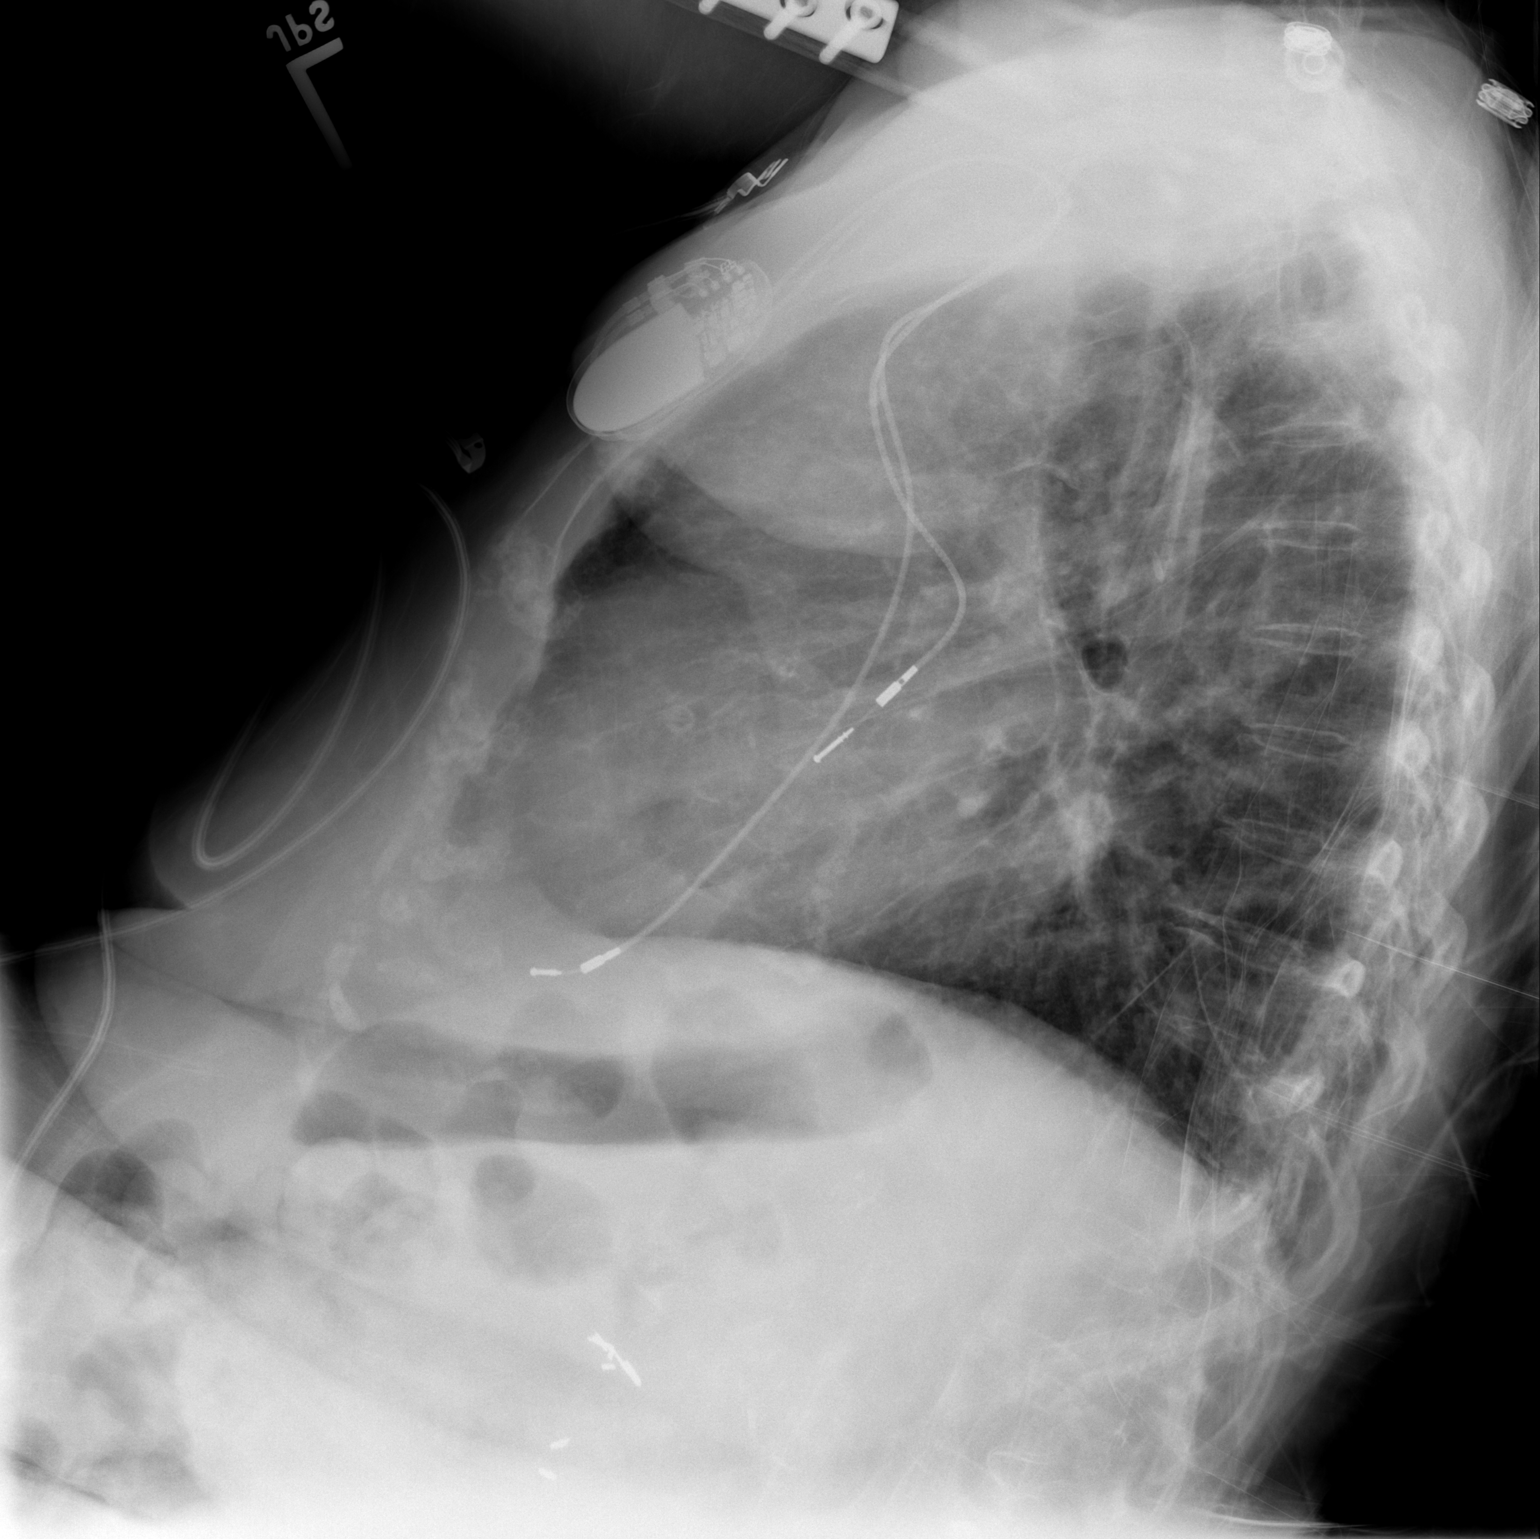

[1 of 1 positions shown; findings below may reference images not displayed]

FINDINGS: The heart size and vascularity are normal and the lungs
are clear.   The multiple old healed right rib fractures.  No
effusions or infiltrates.  Dual lead pacer in place.
IMPRESSION: No acute abnormality.

## 2013-10-06 ENCOUNTER — Other Ambulatory Visit: Payer: Self-pay | Admitting: *Deleted

## 2013-10-06 NOTE — Telephone Encounter (Signed)
Called in refills for Ambien 5mg  qhs w/3 refills.

## 2013-11-03 DEATH — deceased

## 2013-11-17 ENCOUNTER — Encounter: Payer: Self-pay | Admitting: *Deleted

## 2014-05-14 ENCOUNTER — Encounter (HOSPITAL_COMMUNITY): Payer: Self-pay | Admitting: Cardiovascular Disease

## 2014-12-21 ENCOUNTER — Encounter: Payer: Self-pay | Admitting: *Deleted

## 2015-02-04 ENCOUNTER — Encounter: Payer: Self-pay | Admitting: Cardiovascular Disease

## 2018-08-28 ENCOUNTER — Encounter (HOSPITAL_COMMUNITY): Payer: Self-pay | Admitting: *Deleted
